# Patient Record
Sex: Male | Born: 1935 | Race: White | Hispanic: No | Marital: Married | Smoking: Never smoker
Health system: Southern US, Community
[De-identification: ages and names within clinical notes are randomized; demographics above are authoritative.]

## PROBLEM LIST (undated history)

## (undated) DIAGNOSIS — J209 Acute bronchitis, unspecified: Secondary | ICD-10-CM

## (undated) DIAGNOSIS — C439 Malignant melanoma of skin, unspecified: Secondary | ICD-10-CM

## (undated) DIAGNOSIS — I10 Essential (primary) hypertension: Secondary | ICD-10-CM

## (undated) DIAGNOSIS — E559 Vitamin D deficiency, unspecified: Secondary | ICD-10-CM

## (undated) DIAGNOSIS — K219 Gastro-esophageal reflux disease without esophagitis: Secondary | ICD-10-CM

## (undated) DIAGNOSIS — C4492 Squamous cell carcinoma of skin, unspecified: Secondary | ICD-10-CM

## (undated) DIAGNOSIS — J449 Chronic obstructive pulmonary disease, unspecified: Secondary | ICD-10-CM

## (undated) DIAGNOSIS — R600 Localized edema: Secondary | ICD-10-CM

## (undated) DIAGNOSIS — I517 Cardiomegaly: Secondary | ICD-10-CM

## (undated) DIAGNOSIS — Z87828 Personal history of other (healed) physical injury and trauma: Secondary | ICD-10-CM

## (undated) HISTORY — DX: Cardiomegaly: I51.7

## (undated) HISTORY — PX: HERNIA REPAIR: SHX51

## (undated) HISTORY — DX: Localized edema: R60.0

## (undated) HISTORY — DX: Acute bronchitis, unspecified: J20.9

## (undated) HISTORY — PX: APPENDECTOMY: SHX54

## (undated) HISTORY — PX: KNEE SURGERY: SHX244

## (undated) HISTORY — DX: Vitamin D deficiency, unspecified: E55.9

## (undated) HISTORY — DX: Squamous cell carcinoma of skin, unspecified: C44.92

## (undated) HISTORY — PX: NASAL SINUS SURGERY: SHX719

---

## 1988-07-01 DIAGNOSIS — Z87828 Personal history of other (healed) physical injury and trauma: Secondary | ICD-10-CM

## 1988-07-01 HISTORY — DX: Personal history of other (healed) physical injury and trauma: Z87.828

## 1988-07-01 HISTORY — PX: ANKLE SURGERY: SHX546

## 1993-07-01 HISTORY — PX: BRAIN SURGERY: SHX531

## 1999-11-05 ENCOUNTER — Ambulatory Visit (HOSPITAL_COMMUNITY): Admission: RE | Admit: 1999-11-05 | Discharge: 1999-11-05 | Payer: Self-pay | Admitting: Gastroenterology

## 2002-07-12 ENCOUNTER — Encounter: Payer: Self-pay | Admitting: Emergency Medicine

## 2002-07-12 ENCOUNTER — Emergency Department (HOSPITAL_COMMUNITY): Admission: EM | Admit: 2002-07-12 | Discharge: 2002-07-12 | Payer: Self-pay | Admitting: Emergency Medicine

## 2011-04-18 ENCOUNTER — Encounter: Payer: Self-pay | Admitting: *Deleted

## 2011-04-18 ENCOUNTER — Emergency Department (HOSPITAL_BASED_OUTPATIENT_CLINIC_OR_DEPARTMENT_OTHER)
Admission: EM | Admit: 2011-04-18 | Discharge: 2011-04-18 | Disposition: A | Discharge status: Home / Self Care | Payer: Medicare Other | Attending: Emergency Medicine | Admitting: Emergency Medicine

## 2011-04-18 DIAGNOSIS — J449 Chronic obstructive pulmonary disease, unspecified: Secondary | ICD-10-CM | POA: Insufficient documentation

## 2011-04-18 DIAGNOSIS — H7290 Unspecified perforation of tympanic membrane, unspecified ear: Secondary | ICD-10-CM

## 2011-04-18 DIAGNOSIS — E119 Type 2 diabetes mellitus without complications: Secondary | ICD-10-CM | POA: Insufficient documentation

## 2011-04-18 DIAGNOSIS — J4489 Other specified chronic obstructive pulmonary disease: Secondary | ICD-10-CM | POA: Insufficient documentation

## 2011-04-18 DIAGNOSIS — I10 Essential (primary) hypertension: Secondary | ICD-10-CM | POA: Insufficient documentation

## 2011-04-18 DIAGNOSIS — K219 Gastro-esophageal reflux disease without esophagitis: Secondary | ICD-10-CM | POA: Insufficient documentation

## 2011-04-18 DIAGNOSIS — H9209 Otalgia, unspecified ear: Secondary | ICD-10-CM | POA: Insufficient documentation

## 2011-04-18 HISTORY — DX: Chronic obstructive pulmonary disease, unspecified: J44.9

## 2011-04-18 HISTORY — DX: Gastro-esophageal reflux disease without esophagitis: K21.9

## 2011-04-18 HISTORY — DX: Essential (primary) hypertension: I10

## 2011-04-18 MED ORDER — AMOXICILLIN 500 MG PO CAPS
1000.0000 mg | ORAL_CAPSULE | Freq: Once | ORAL | Status: AC
  Administered 2011-04-18: 1000 mg via ORAL
  Filled 2011-04-18: qty 2

## 2011-04-18 MED ORDER — HYDROCODONE-ACETAMINOPHEN 5-325 MG PO TABS: 1.0000 | ORAL_TABLET | Freq: Four times a day (QID) | ORAL | Status: AC | PRN

## 2011-04-18 MED ORDER — AMOXICILLIN 500 MG PO CAPS: 1000.0000 mg | ORAL_CAPSULE | Freq: Three times a day (TID) | ORAL | Status: AC

## 2011-04-18 NOTE — ED Provider Notes (Signed)
History     CSN: [id number] Arrival date & time: 04/18/2011 10:55 PM   First MD Initiated Contact with Patient 04/18/11 2256      Chief Complaint  Patient presents with  . Otalgia    (Consider location/radiation/quality/duration/timing/severity/associated sxs/prior treatment) HPI This is a 77 year old white male has had several months of pain in his right ear. He has been [place] by a [place]ologist and is scheduled for tympanostomy tube in 5 days. Today the pain in that year became severe and caused him to have the worst headache of his life. He called his otolaryngologist office and was told they could not see them before the scheduled date. About 7 PM he felt a sudden release of pressure in that ear looked is accompanied by bleeding. The pain has significantly improved and is at this time only mild. He continues to have blood from the right ear and is no longer able to hear with the right ear. He also hears a popping sound in that ear from time to time along with the sound of blood rushing.  Past Medical History  Diagnosis Date  . Diabetes mellitus   . Hypertension   . COPD (chronic obstructive pulmonary disease)   . GERD (gastroesophageal reflux disease)     Past Surgical History  Procedure Date  . Hernia repair   . Back surgery   . Ankle surgery   . Brain surgery   . Appendectomy     No family history on file.  History  Substance Use Topics  . Smoking status: Never Smoker   . Smokeless tobacco: Not on file  . Alcohol Use: No      Review of Systems  All other systems reviewed and are negative.    Allergies  Review of patient's allergies indicates no known allergies.  Home Medications  No current outpatient prescriptions on file.  BP 134/75  Pulse 97  Temp(Src) 99.7 F (37.6 C) (Oral)  Resp 18  Ht 5\' 10"  (1.778 m)  Wt 245 lb (111.131 kg)  BMI 35.15 kg/m2  SpO2 97%  Physical Exam General: Well-developed, well-nourished male in no  acute distress; appearance consistent with age of record HENT: normocephalic, atraumatic; left TM is normal appearance; right TM obscured by blood, hearing decrease in right ear Eyes: pupils equal round and reactive to light; extraocular muscles intact Neck: supple Heart: regular rate and rhythm Lungs: clear to auscultation bilaterally; occasional rhonchi cleared with coughing Abdomen: soft; nondistended Extremities: No deformity; full range of motion; pulses normal; trace edema of lower leg Neurologic: Awake, alert and oriented;motor function intact in all extremities and symmetric; no facial droop Skin: Warm and dry Psychiatric: Normal mood and affect    ED Course  Procedures (including critical care time)    MDM  We'll treat with amoxicillin and advised that patient contact his ENT in the morning for possible earlier visit.         Hanley Seamen, MD 04/18/11 [phone]

## 2011-04-18 NOTE — ED Notes (Signed)
Patient has had earache x several months that [facility] ENT has been treating him for. Pt has a schedule for next Tuesday for having tubes placed in ears. States the pain worsened today, and then tonight, around 7pm, he had blood coming out of ear. Pt is concerned for ruptured eardrum. States he cannot hear out of that ear now.

## 2011-10-30 ENCOUNTER — Encounter (INDEPENDENT_AMBULATORY_CARE_PROVIDER_SITE_OTHER): Payer: Medicare Other | Admitting: Ophthalmology

## 2011-11-07 ENCOUNTER — Encounter (INDEPENDENT_AMBULATORY_CARE_PROVIDER_SITE_OTHER): Payer: Medicare Other | Admitting: Ophthalmology

## 2011-11-07 DIAGNOSIS — E1165 Type 2 diabetes mellitus with hyperglycemia: Secondary | ICD-10-CM

## 2011-11-07 DIAGNOSIS — H35039 Hypertensive retinopathy, unspecified eye: Secondary | ICD-10-CM

## 2011-11-07 DIAGNOSIS — I10 Essential (primary) hypertension: Secondary | ICD-10-CM

## 2011-11-07 DIAGNOSIS — H2510 Age-related nuclear cataract, unspecified eye: Secondary | ICD-10-CM

## 2011-11-07 DIAGNOSIS — H3530 Unspecified macular degeneration: Secondary | ICD-10-CM

## 2011-11-07 DIAGNOSIS — E11319 Type 2 diabetes mellitus with unspecified diabetic retinopathy without macular edema: Secondary | ICD-10-CM

## 2011-11-07 DIAGNOSIS — H43819 Vitreous degeneration, unspecified eye: Secondary | ICD-10-CM

## 2011-12-06 DIAGNOSIS — I10 Essential (primary) hypertension: Secondary | ICD-10-CM

## 2011-12-06 HISTORY — DX: Essential (primary) hypertension: I10

## 2012-06-15 ENCOUNTER — Other Ambulatory Visit (HOSPITAL_COMMUNITY): Payer: Self-pay | Admitting: Cardiovascular Disease

## 2012-06-15 DIAGNOSIS — R609 Edema, unspecified: Secondary | ICD-10-CM

## 2012-06-15 DIAGNOSIS — R0989 Other specified symptoms and signs involving the circulatory and respiratory systems: Secondary | ICD-10-CM

## 2012-07-03 ENCOUNTER — Ambulatory Visit (HOSPITAL_COMMUNITY)
Admission: RE | Admit: 2012-07-03 | Discharge: 2012-07-03 | Disposition: A | Discharge status: Home / Self Care | Payer: Medicare Other | Source: Ambulatory Visit | Attending: Cardiovascular Disease | Admitting: Cardiovascular Disease

## 2012-07-03 DIAGNOSIS — R609 Edema, unspecified: Secondary | ICD-10-CM

## 2012-07-03 DIAGNOSIS — M7989 Other specified soft tissue disorders: Secondary | ICD-10-CM | POA: Insufficient documentation

## 2012-07-03 DIAGNOSIS — R0989 Other specified symptoms and signs involving the circulatory and respiratory systems: Secondary | ICD-10-CM | POA: Insufficient documentation

## 2012-07-03 DIAGNOSIS — R600 Localized edema: Secondary | ICD-10-CM

## 2012-07-03 HISTORY — DX: Localized edema: R60.0

## 2012-07-03 NOTE — Progress Notes (Signed)
LLE venous duplex completed. Tim Morgan

## 2012-07-03 NOTE — Progress Notes (Signed)
Carotid Duplex Completed. Tim Morgan D  

## 2012-10-23 ENCOUNTER — Encounter: Payer: Self-pay | Admitting: *Deleted

## 2012-10-30 ENCOUNTER — Encounter: Payer: Self-pay | Admitting: Cardiovascular Disease

## 2012-11-06 ENCOUNTER — Ambulatory Visit (INDEPENDENT_AMBULATORY_CARE_PROVIDER_SITE_OTHER): Payer: Medicare Other | Admitting: Ophthalmology

## 2012-11-06 ENCOUNTER — Encounter (HOSPITAL_COMMUNITY): Payer: Self-pay | Admitting: Pharmacy Technician

## 2012-11-09 NOTE — Progress Notes (Signed)
Need Pre Op orders please- has appt 11/16/12 [facility]  Thanks

## 2012-11-10 ENCOUNTER — Other Ambulatory Visit: Payer: Self-pay | Admitting: Orthopedic Surgery

## 2012-11-10 MED ORDER — DEXAMETHASONE SODIUM PHOSPHATE 10 MG/ML IJ SOLN: 10.0000 mg | Freq: Once | INTRAMUSCULAR | Status: DC

## 2012-11-10 NOTE — Progress Notes (Signed)
Preoperative surgical orders have been place into the Epic hospital system for Tim Morgan on 11/10/2012, 9:10 AM  by Patrica Duel for surgery on 11/18/2012.  Preop Knee Scope orders including IV Tylenol and IV Decadron as long as there are no contraindications to the above medications. Avel Peace, PA-C

## 2012-11-13 ENCOUNTER — Other Ambulatory Visit (HOSPITAL_COMMUNITY): Payer: Self-pay | Admitting: *Deleted

## 2012-11-13 NOTE — Patient Instructions (Addendum)
20 Tim Morgan  11/13/2012   Your procedure is scheduled on: 11-18-2012  Report to [facility] at 745 AM.  Call this number if you have problems the morning of surgery [phone]   Remember:   Do not eat food or drink liquids :After Midnight.     Take these medicines the morning of surgery with A SIP OF WATER: nexium, quar inhaler                                SEE [place] PREPARING FOR SURGERY SHEET   Do not wear jewelry, make-up or nail polish.  Do not wear lotions, powders, or perfumes. You may wear deodorant.   Men may shave face and neck.  Do not bring valuables to the hospital.  Contacts, dentures or bridgework may not be worn into surgery.  Leave suitcase in the car. After surgery it may be brought to your room.  For patients admitted to the hospital, checkout time is 11:00 AM the day of discharge.   Patients discharged the day of surgery will not be allowed to drive home.  Name and phone number of your driver:jacqueline wife cell [phone]  Special Instructions: N/A   Please read over the following fact sheets that you were given: MRSA Information.  Call Cain Sieve RN pre op nurse if needed [phone]    FAILURE TO FOLLOW THESE INSTRUCTIONS MAY RESULT IN THE CANCELLATION OF YOUR SURGERY. PATIENT SIGNATURE___________________________________________

## 2012-11-16 ENCOUNTER — Encounter (HOSPITAL_COMMUNITY): Payer: Self-pay

## 2012-11-16 ENCOUNTER — Encounter (HOSPITAL_COMMUNITY)
Admission: RE | Admit: 2012-11-16 | Discharge: 2012-11-16 | Disposition: A | Discharge status: Home / Self Care | Payer: Medicare Other | Source: Ambulatory Visit | Attending: Orthopedic Surgery | Admitting: Orthopedic Surgery

## 2012-11-16 HISTORY — DX: Personal history of other (healed) physical injury and trauma: Z87.828

## 2012-11-16 LAB — BASIC METABOLIC PANEL
CO2: 30 mEq/L (ref 19–32)
Calcium: 9.8 mg/dL (ref 8.4–10.5)
Creatinine, Ser: 1.19 mg/dL (ref 0.50–1.35)
GFR calc non Af Amer: 57 mL/min — ABNORMAL LOW (ref >90)
Glucose, Bld: 161 mg/dL — ABNORMAL HIGH (ref 70–99)
Sodium: 138 mEq/L (ref 135–145)

## 2012-11-16 LAB — SURGICAL PCR SCREEN: MRSA, PCR: NEGATIVE

## 2012-11-16 LAB — CBC
MCH: 30.4 pg (ref 26.0–34.0)
MCV: 90.2 fL (ref 78.0–100.0)
Platelets: 244 10*3/uL (ref 150–400)
RBC: 4.37 MIL/uL (ref 4.22–5.81)
RDW: 13 % (ref 11.5–15.5)

## 2012-11-16 NOTE — Progress Notes (Signed)
bmet results faxed to dr aluisio by epic 

## 2012-11-16 NOTE — Progress Notes (Signed)
11/16/12 0917  OBSTRUCTIVE SLEEP APNEA  Have you ever been diagnosed with sleep apnea through a sleep study? No  Do you snore loudly (loud enough to be heard through closed doors)?  0  Do you often feel tired, fatigued, or sleepy during the daytime? 0  Has anyone observed you stop breathing during your sleep? 0  Do you have, or are you being treated for high blood pressure? 1  BMI more than 35 kg/m2? 1  Age over 77 years old? 1  Neck circumference greater than 40 cm/18 inches? 0  Gender: 1  Obstructive Sleep Apnea Score 4  Score 4 or greater  Results sent to PCP

## 2012-11-17 NOTE — Progress Notes (Signed)
eccho 5/13, EKG 2/14, chest x ray (old) from 4/13 placed on chart

## 2012-11-17 NOTE — H&P (Signed)
CC- Tim Morgan is a 79 y.o. male who presents with left knee pain.  HPI- . Knee Pain: Patient presents with knee pain involving the  left knee. Onset of the symptoms was several months ago. Inciting event: none known. Current symptoms include giving out, pain located medially, popping sensation and swelling. Pain is aggravated by going up and down stairs, lateral movements, pivoting, rising after sitting and squatting.  Patient has had no prior knee problems. Evaluation to date: MRI: abnormal medial meniscal tear. Treatment to date: rest.  Past Medical History  Diagnosis Date  . Diabetes mellitus   . COPD (chronic obstructive pulmonary disease)   . GERD (gastroesophageal reflux disease)   . Vitamin D deficiency   . Left ventricular hypertrophy   . Acute bronchitis   . Hypertension 12-06-2011    echo-EF 65-70%   . 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 History of back injury 1990    L 4 to , no surgery healed on own    Past Surgical History  Procedure Laterality Date  . Ankle surgery Left 1990  . Appendectomy  age 36  . Hernia repair      x 2  . Brain surgery  1995    concusion  . Nasal sinus surgery Bilateral     to open up ear tubes    Prior to Admission medications   Medication Sig Start Date End Date Taking? Authorizing Provider  amLODipine-olmesartan (AZOR) 5-40 MG per tablet Take 1 tablet by mouth every morning.     Historical Provider, MD  beclomethasone (QVAR) 40 MCG/ACT inhaler Inhale 2 puffs into the lungs 2 (two) times daily.    Historical Provider, MD  calcium citrate-vitamin D (CITRACAL+D) 315-200 MG-UNIT per tablet Take 1 tablet by mouth 2 (two) times daily.    Historical Provider, MD  Chromium-Cinnamon  (CINNAMON PLUS CHROMIUM PO) Take 2-3 capsules by mouth 2 (two) times daily.    Historical Provider, MD  esomeprazole (NEXIUM) 40 MG capsule Take 40 mg by mouth daily.      Historical Provider, MD  gabapentin (NEURONTIN) 300 MG capsule Take 300 mg by mouth 2 (two) times daily.     Historical Provider, MD  glyBURIDE-metformin (GLUCOVANCE) 5-500 MG per tablet Take 1 tablet by mouth 2 (two) times daily with a meal.     Historical Provider, MD  hydrochlorothiazide (MICROZIDE) 12.5 MG capsule Take 12.5 mg by mouth every morning.     Historical Provider, MD  MAGNESIUM-ZINC PO Take 1 tablet by mouth 2 (two) times daily.    Historical Provider, MD  methocarbamol (ROBAXIN) 500 MG tablet Take 500 mg by mouth 2 (two) times daily.     Historical Provider, MD  Multiple Vitamin (MULTIVITAMIN WITH MINERALS) TABS Take 1 tablet by mouth daily.    Historical Provider, MD  Omega-3 Fatty Acids (FISH OIL) 1200 MG CAPS Take 1,200 mg by mouth 2 (two) times daily.    Historical Provider, MD  Potassium 99 MG TABS Take 99 mg by mouth daily.    Historical Provider, MD  pravastatin (PRAVACHOL) 40 MG tablet Take 40 mg by mouth at bedtime.    Historical Provider, MD  Saw Palmetto, Serenoa repens, 320 MG CAPS Take 320 mg by mouth at bedtime.    Historical Provider,  MD  sitaGLIPtin (JANUVIA) 100 MG tablet Take 100 mg by mouth at bedtime.     Historical Provider, MD  vitamin E 400 UNIT capsule Take 800 Units by mouth daily.    Historical Provider, MD   KNEE EXAM antalgic gait, soft tissue tenderness over medial joint line, no effusion, negative drawer sign, collateral ligaments intact  Physical Examination: General appearance - alert, well appearing, and in no distress Mental status - alert, oriented to person, place, and time Chest - clear to auscultation, no wheezes, rales or rhonchi, symmetric air entry Heart - normal rate, regular rhythm, normal S1, S2, no murmurs, rubs, clicks or gallops Abdomen - soft, nontender,  nondistended, no masses or organomegaly Neurological - alert, oriented, normal speech, no focal findings or movement disorder noted   Asessment/Plan--- Left knee medial meniscal tear- - Plan left knee arthroscopy with meniscal debridement. Procedure risks and potential comps discussed with patient who elects to proceed. Goals are decreased pain and increased function with a high likelihood of achieving both

## 2012-11-17 NOTE — Progress Notes (Signed)
OV 08/19/12 [facility] Gerre Pebbles with stress test 3/14 and u/s abdomen 3/14 on chart

## 2012-11-18 ENCOUNTER — Ambulatory Visit (HOSPITAL_COMMUNITY): Payer: Medicare Other | Admitting: Anesthesiology

## 2012-11-18 ENCOUNTER — Ambulatory Visit (HOSPITAL_COMMUNITY): Payer: Medicare Other

## 2012-11-18 ENCOUNTER — Encounter (HOSPITAL_COMMUNITY): Payer: Self-pay | Admitting: *Deleted

## 2012-11-18 ENCOUNTER — Ambulatory Visit (HOSPITAL_COMMUNITY)
Admission: RE | Admit: 2012-11-18 | Discharge: 2012-11-18 | Disposition: A | Discharge status: Home / Self Care | Payer: Medicare Other | Source: Ambulatory Visit | Attending: Orthopedic Surgery | Admitting: Orthopedic Surgery

## 2012-11-18 ENCOUNTER — Encounter (HOSPITAL_COMMUNITY): Payer: Self-pay | Admitting: Anesthesiology

## 2012-11-18 ENCOUNTER — Encounter (HOSPITAL_COMMUNITY)
Admission: RE | Disposition: A | Discharge status: Home / Self Care | Payer: Self-pay | Source: Ambulatory Visit | Attending: Orthopedic Surgery

## 2012-11-18 DIAGNOSIS — S83242A Other tear of medial meniscus, current injury, left knee, initial encounter: Secondary | ICD-10-CM

## 2012-11-18 DIAGNOSIS — K219 Gastro-esophageal reflux disease without esophagitis: Secondary | ICD-10-CM | POA: Insufficient documentation

## 2012-11-18 DIAGNOSIS — S83249A Other tear of medial meniscus, current injury, unspecified knee, initial encounter: Secondary | ICD-10-CM

## 2012-11-18 DIAGNOSIS — Z79899 Other long term (current) drug therapy: Secondary | ICD-10-CM | POA: Insufficient documentation

## 2012-11-18 DIAGNOSIS — E669 Obesity, unspecified: Secondary | ICD-10-CM | POA: Insufficient documentation

## 2012-11-18 DIAGNOSIS — M23329 Other meniscus derangements, posterior horn of medial meniscus, unspecified knee: Secondary | ICD-10-CM | POA: Insufficient documentation

## 2012-11-18 DIAGNOSIS — E119 Type 2 diabetes mellitus without complications: Secondary | ICD-10-CM | POA: Insufficient documentation

## 2012-11-18 DIAGNOSIS — J449 Chronic obstructive pulmonary disease, unspecified: Secondary | ICD-10-CM | POA: Insufficient documentation

## 2012-11-18 DIAGNOSIS — I10 Essential (primary) hypertension: Secondary | ICD-10-CM | POA: Insufficient documentation

## 2012-11-18 DIAGNOSIS — J4489 Other specified chronic obstructive pulmonary disease: Secondary | ICD-10-CM | POA: Insufficient documentation

## 2012-11-18 DIAGNOSIS — M899 Disorder of bone, unspecified: Secondary | ICD-10-CM | POA: Insufficient documentation

## 2012-11-18 HISTORY — PX: KNEE ARTHROSCOPY: SHX127

## 2012-11-18 LAB — GLUCOSE, CAPILLARY: Glucose-Capillary: 150 mg/dL — ABNORMAL HIGH (ref 70–99)

## 2012-11-18 SURGERY — ARTHROSCOPY, KNEE
Anesthesia: General | Site: Knee | Laterality: Left | Wound class: Clean

## 2012-11-18 MED ORDER — OXYCODONE HCL 5 MG/5ML PO SOLN
5.0000 mg | Freq: Once | ORAL | Status: DC | PRN
Start: 2012-11-18 — End: 2012-11-18

## 2012-11-18 MED ORDER — METHOCARBAMOL 500 MG PO TABS: 500.0000 mg | ORAL_TABLET | Freq: Four times a day (QID) | ORAL | Status: DC

## 2012-11-18 MED ORDER — CEFAZOLIN SODIUM-DEXTROSE 2-3 GM-% IV SOLR
INTRAVENOUS | Status: AC
  Filled 2012-11-18: qty 50

## 2012-11-18 MED ORDER — LACTATED RINGERS IR SOLN
Status: DC | PRN
  Administered 2012-11-18: 6000 mL

## 2012-11-18 MED ORDER — LIDOCAINE HCL (CARDIAC) 20 MG/ML IV SOLN
INTRAVENOUS | Status: DC | PRN
  Administered 2012-11-18: 50 mg via INTRAVENOUS

## 2012-11-18 MED ORDER — ACETAMINOPHEN 10 MG/ML IV SOLN: 1000.0000 mg | Freq: Once | INTRAVENOUS | Status: DC

## 2012-11-18 MED ORDER — ACETAMINOPHEN 10 MG/ML IV SOLN: 1000.0000 mg | Freq: Once | INTRAVENOUS | Status: DC | PRN

## 2012-11-18 MED ORDER — ONDANSETRON HCL 4 MG/2ML IJ SOLN
INTRAMUSCULAR | Status: DC | PRN
  Administered 2012-11-18: 4 mg via INTRAVENOUS

## 2012-11-18 MED ORDER — PROPOFOL 10 MG/ML IV BOLUS
INTRAVENOUS | Status: DC | PRN
  Administered 2012-11-18: 150 mg via INTRAVENOUS

## 2012-11-18 MED ORDER — ACETAMINOPHEN 10 MG/ML IV SOLN
INTRAVENOUS | Status: AC
  Filled 2012-11-18: qty 100

## 2012-11-18 MED ORDER — BUPIVACAINE-EPINEPHRINE 0.25% -1:200000 IJ SOLN
INTRAMUSCULAR | Status: DC | PRN
  Administered 2012-11-18: 20 mL

## 2012-11-18 MED ORDER — LACTATED RINGERS IV SOLN: INTRAVENOUS | Status: DC

## 2012-11-18 MED ORDER — CEFAZOLIN SODIUM 1-5 GM-% IV SOLN
INTRAVENOUS | Status: AC
  Filled 2012-11-18: qty 50

## 2012-11-18 MED ORDER — HYDROMORPHONE HCL PF 1 MG/ML IJ SOLN
0.2500 mg | INTRAMUSCULAR | Status: DC | PRN
  Administered 2012-11-18: 0.25 mg via INTRAVENOUS

## 2012-11-18 MED ORDER — FENTANYL CITRATE 0.05 MG/ML IJ SOLN
INTRAMUSCULAR | Status: DC | PRN
  Administered 2012-11-18 (×3): 25 ug via INTRAVENOUS

## 2012-11-18 MED ORDER — LACTATED RINGERS IV SOLN
INTRAVENOUS | Status: DC | PRN
  Administered 2012-11-18: 09:00:00 via INTRAVENOUS

## 2012-11-18 MED ORDER — MEPERIDINE HCL 50 MG/ML IJ SOLN: 6.2500 mg | INTRAMUSCULAR | Status: DC | PRN

## 2012-11-18 MED ORDER — ACETAMINOPHEN 10 MG/ML IV SOLN
INTRAVENOUS | Status: DC | PRN
  Administered 2012-11-18: 1000 mg via INTRAVENOUS

## 2012-11-18 MED ORDER — PROMETHAZINE HCL 25 MG/ML IJ SOLN: 6.2500 mg | INTRAMUSCULAR | Status: DC | PRN

## 2012-11-18 MED ORDER — SODIUM CHLORIDE 0.9 % IV SOLN: INTRAVENOUS | Status: DC

## 2012-11-18 MED ORDER — HYDROMORPHONE HCL PF 1 MG/ML IJ SOLN
INTRAMUSCULAR | Status: AC
  Filled 2012-11-18: qty 1

## 2012-11-18 MED ORDER — OXYCODONE HCL 5 MG PO TABS: 5.0000 mg | ORAL_TABLET | Freq: Once | ORAL | Status: DC | PRN

## 2012-11-18 MED ORDER — BUPIVACAINE-EPINEPHRINE PF 0.25-1:200000 % IJ SOLN
INTRAMUSCULAR | Status: AC
  Filled 2012-11-18: qty 30

## 2012-11-18 MED ORDER — DEXTROSE 5 % IV SOLN
3.0000 g | INTRAVENOUS | Status: AC
  Administered 2012-11-18: 3 g via INTRAVENOUS
  Filled 2012-11-18: qty 3000

## 2012-11-18 MED ORDER — HYDROCODONE-ACETAMINOPHEN 5-325 MG PO TABS: 1.0000 | ORAL_TABLET | Freq: Four times a day (QID) | ORAL | Status: DC | PRN

## 2012-11-18 SURGICAL SUPPLY — 23 items
BANDAGE ELASTIC 6 VELCRO ST LF (GAUZE/BANDAGES/DRESSINGS) ×2 IMPLANT
BLADE 4.2CUDA (BLADE) ×2 IMPLANT
CLOTH BEACON ORANGE TIMEOUT ST (SAFETY) ×2 IMPLANT
CUFF TOURN SGL QUICK 34 (TOURNIQUET CUFF) ×1
CUFF TRNQT CYL 34X4X40X1 (TOURNIQUET CUFF) ×1 IMPLANT
DRAPE U-SHAPE 47X51 STRL (DRAPES) ×2 IMPLANT
DRSG EMULSION OIL 3X3 NADH (GAUZE/BANDAGES/DRESSINGS) ×2 IMPLANT
DURAPREP 26ML APPLICATOR (WOUND CARE) ×2 IMPLANT
GLOVE BIO SURGEON STRL SZ8 (GLOVE) ×2 IMPLANT
GLOVE BIOGEL PI IND STRL 8 (GLOVE) ×1 IMPLANT
GLOVE BIOGEL PI INDICATOR 8 (GLOVE) ×1
GOWN STRL NON-REIN LRG LVL3 (GOWN DISPOSABLE) ×2 IMPLANT
MANIFOLD NEPTUNE II (INSTRUMENTS) ×4 IMPLANT
PACK ARTHROSCOPY WL (CUSTOM PROCEDURE TRAY) ×2 IMPLANT
PACK ICE MAXI GEL EZY WRAP (MISCELLANEOUS) ×6 IMPLANT
PADDING CAST COTTON 6X4 STRL (CAST SUPPLIES) ×2 IMPLANT
POSITIONER SURGICAL ARM (MISCELLANEOUS) ×2 IMPLANT
SET ARTHROSCOPY TUBING (MISCELLANEOUS) ×1
SET ARTHROSCOPY TUBING LN (MISCELLANEOUS) ×1 IMPLANT
SUT ETHILON 4 0 PS 2 18 (SUTURE) ×2 IMPLANT
TOWEL OR 17X26 10 PK STRL BLUE (TOWEL DISPOSABLE) ×2 IMPLANT
WAND 90 DEG TURBOVAC W/CORD (SURGICAL WAND) ×2 IMPLANT
WRAP KNEE MAXI GEL POST OP (GAUZE/BANDAGES/DRESSINGS) ×4 IMPLANT

## 2012-11-18 NOTE — Transfer of Care (Signed)
Immediate Anesthesia Transfer of Care Note  Patient: Tim Morgan  Procedure(s) Performed: Procedure(s) (LRB): LEFT ARTHROSCOPY KNEE WITH DEBRIDEMENT AND CHRONDROLPLASTY (Left)  Patient Location: PACU  Anesthesia Type: General  Level of Consciousness: sedated, patient cooperative and responds to stimulaton  Airway & Oxygen Therapy: Patient Spontanous Breathing and Patient connected to face mask oxgen  Post-op Assessment: Report given to PACU RN and Post -op Vital signs reviewed and stable  Post vital signs: Reviewed and stable  Complications: No apparent anesthesia complications

## 2012-11-18 NOTE — Interval H&P Note (Signed)
History and Physical Interval Note:  11/18/2012 9:50 AM  Tim Morgan  has presented today for surgery, with the diagnosis of LEFT KNEE MEDIAL MENISCUS TEAR  The various methods of treatment have been discussed with the patient and family. After consideration of risks, benefits and other options for treatment, the patient has consented to  Procedure(s): LEFT ARTHROSCOPY KNEE WITH DEBRIDEMENT (Left) as a surgical intervention .  The patient's history has been reviewed, patient examined, no change in status, stable for surgery.  I have reviewed the patient's chart and labs.  Questions were answered to the patient's satisfaction.     Loanne Drilling

## 2012-11-18 NOTE — Anesthesia Postprocedure Evaluation (Signed)
Anesthesia Post Note  Patient: Tim Morgan  Procedure(s) Performed: Procedure(s) (LRB): LEFT ARTHROSCOPY KNEE WITH DEBRIDEMENT AND CHRONDROLPLASTY (Left)  Anesthesia type: General  Patient location: PACU  Post pain: Pain level controlled  Post assessment: Post-op Vital signs reviewed  Last Vitals: BP 115/55  Pulse 69  Temp(Src) 36.4 C (Oral)  Resp 16  SpO2 96%  Post vital signs: Reviewed  Level of consciousness: sedated  Complications: No apparent anesthesia complications

## 2012-11-18 NOTE — Anesthesia Preprocedure Evaluation (Addendum)
Anesthesia Evaluation  Patient identified by MRN, date of birth, ID band Patient awake    Reviewed: Allergy & Precautions, H&P , NPO status , Patient's Chart, lab work & pertinent test results  Airway Mallampati: II TM Distance: >3 FB Neck ROM: Full    Dental  (+) Dental Advisory Given and Teeth Intact   Pulmonary COPD COPD inhaler,  breath sounds clear to auscultation        Cardiovascular hypertension, Pt. on medications Rhythm:Regular Rate:Normal     Neuro/Psych negative neurological ROS  negative psych ROS   GI/Hepatic Neg liver ROS, GERD-  Medicated,  Endo/Other  diabetes  Renal/GU negative Renal ROS     Musculoskeletal negative musculoskeletal ROS (+)   Abdominal (+) + obese,   Peds  Hematology negative hematology ROS (+)   Anesthesia Other Findings   Reproductive/Obstetrics                          Anesthesia Physical Anesthesia Plan  ASA: III  Anesthesia Plan: General   Post-op Pain Management:    Induction: Intravenous  Airway Management Planned: LMA  Additional Equipment:   Intra-op Plan:   Post-operative Plan: Extubation in OR  Informed Consent: I have reviewed the patients History and Physical, chart, labs and discussed the procedure including the risks, benefits and alternatives for the proposed anesthesia with the patient or authorized representative who has indicated his/her understanding and acceptance.   Dental advisory given  Plan Discussed with: CRNA  Anesthesia Plan Comments:         Anesthesia Quick Evaluation

## 2012-11-18 NOTE — Progress Notes (Signed)
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

## 2012-11-18 NOTE — Op Note (Signed)
Preoperative diagnosis-  Left knee medial meniscal tear  Postoperative diagnosis Left- knee medial meniscal tear   Plus Left medial femoral chondral defect  Procedure- Left knee arthroscopy with medial  Meniscal debridement and chondroplasty  Surgeon- Gus Rankin. Kingston Guiles, MD  Anesthesia-General  EBL-  minimal Complications- None  Condition- PACU - hemodynamically stable.  Brief clinical note- -Tim Morgan is a 79 y.o.  male with a several month history of left knee pain and mechanical symptoms. Exam and history suggested medial meniscal tear confirmed by MRI. The patient presents now for arthroscopy and debridement   Procedure in detail -       After successful administration of General anesthetic, a tourmiquet is placed high on the Left  thigh and the Left lower extremity is prepped and draped in the usual sterile fashion. Time out is performed by the surgical team. Standard superomedial and inferolateral portal sites are marked and incisions made with an 11 blade. The inflow cannula is passed through the superomedial portal and camera through the inferolateral portal and inflow is initiated. Arthroscopic visualization proceeds.      The undersurface of the patella and trochlea are visualized and they appear normal with minimal chondromalacia. The medial and lateral gutters are visualized and there are  no loose bodies. Flexion and valgus force is applied to the knee and the medial compartment is entered. A spinal needle is passed into the joint through the site marked for the inferomedial portal. A small incision is made and the dilator passed into the joint. The findings for the medial compartment are medial meniscal tear body and posterior horn as well as chondral defect medial femoral condyle . The tear is debrided to a stable base with baskets and a shaver and sealed off with the Arthrocare. The shaver is used to debride the unstable cartilage to a stable cartilaginous base with stable  edges. It is probed and found to be stable.    The intercondylar notch is visualized and the ACL appears normal. The lateral compartment is entered and the findings are normal .      The joint is again inspected and there are no other tears, defects or loose bodies identified. The arthroscopic equipment is then removed from the inferior portals which are closed with interrupted 4-0 nylon. 20 ml of .25% Marcaine with epinephrine are injected through the inflow cannula and the cannula is then removed and the portal closed with nylon. The incisions are cleaned and dried and a bulky sterile dressing is applied. The patient is then awakened and transported to recovery in stable condition.   11/18/2012, 10:40 AM

## 2012-11-19 ENCOUNTER — Encounter (HOSPITAL_COMMUNITY): Payer: Self-pay | Admitting: Orthopedic Surgery

## 2013-02-17 ENCOUNTER — Ambulatory Visit (INDEPENDENT_AMBULATORY_CARE_PROVIDER_SITE_OTHER): Payer: Medicare Other | Admitting: Ophthalmology

## 2013-02-17 DIAGNOSIS — H3530 Unspecified macular degeneration: Secondary | ICD-10-CM

## 2013-02-17 DIAGNOSIS — E11319 Type 2 diabetes mellitus with unspecified diabetic retinopathy without macular edema: Secondary | ICD-10-CM

## 2013-02-17 DIAGNOSIS — I10 Essential (primary) hypertension: Secondary | ICD-10-CM

## 2013-02-17 DIAGNOSIS — E1139 Type 2 diabetes mellitus with other diabetic ophthalmic complication: Secondary | ICD-10-CM

## 2013-02-17 DIAGNOSIS — H35039 Hypertensive retinopathy, unspecified eye: Secondary | ICD-10-CM

## 2013-02-17 DIAGNOSIS — H43819 Vitreous degeneration, unspecified eye: Secondary | ICD-10-CM

## 2013-05-19 ENCOUNTER — Encounter (HOSPITAL_BASED_OUTPATIENT_CLINIC_OR_DEPARTMENT_OTHER): Payer: Self-pay | Admitting: Emergency Medicine

## 2013-05-19 ENCOUNTER — Emergency Department (HOSPITAL_BASED_OUTPATIENT_CLINIC_OR_DEPARTMENT_OTHER)
Admission: EM | Admit: 2013-05-19 | Discharge: 2013-05-19 | Disposition: A | Discharge status: Home / Self Care | Payer: Medicare Other | Attending: Emergency Medicine | Admitting: Emergency Medicine

## 2013-05-19 DIAGNOSIS — Z87828 Personal history of other (healed) physical injury and trauma: Secondary | ICD-10-CM | POA: Insufficient documentation

## 2013-05-19 DIAGNOSIS — Z79899 Other long term (current) drug therapy: Secondary | ICD-10-CM | POA: Insufficient documentation

## 2013-05-19 DIAGNOSIS — I10 Essential (primary) hypertension: Secondary | ICD-10-CM | POA: Insufficient documentation

## 2013-05-19 DIAGNOSIS — R112 Nausea with vomiting, unspecified: Secondary | ICD-10-CM | POA: Insufficient documentation

## 2013-05-19 DIAGNOSIS — J111 Influenza due to unidentified influenza virus with other respiratory manifestations: Secondary | ICD-10-CM

## 2013-05-19 DIAGNOSIS — IMO0002 Reserved for concepts with insufficient information to code with codable children: Secondary | ICD-10-CM | POA: Insufficient documentation

## 2013-05-19 DIAGNOSIS — E119 Type 2 diabetes mellitus without complications: Secondary | ICD-10-CM | POA: Insufficient documentation

## 2013-05-19 DIAGNOSIS — J441 Chronic obstructive pulmonary disease with (acute) exacerbation: Secondary | ICD-10-CM | POA: Insufficient documentation

## 2013-05-19 DIAGNOSIS — K219 Gastro-esophageal reflux disease without esophagitis: Secondary | ICD-10-CM | POA: Insufficient documentation

## 2013-05-19 DIAGNOSIS — J069 Acute upper respiratory infection, unspecified: Secondary | ICD-10-CM

## 2013-05-19 LAB — COMPREHENSIVE METABOLIC PANEL
ALT: 15 U/L (ref 0–53)
AST: 14 U/L (ref 0–37)
Calcium: 9 mg/dL (ref 8.4–10.5)
GFR calc Af Amer: 59 mL/min — ABNORMAL LOW (ref >90)
Sodium: 135 mEq/L (ref 135–145)
Total Protein: 7.2 g/dL (ref 6.0–8.3)

## 2013-05-19 LAB — CBC WITH DIFFERENTIAL/PLATELET
Basophils Absolute: 0 10*3/uL (ref 0.0–0.1)
Eosinophils Absolute: 0 10*3/uL (ref 0.0–0.7)
Eosinophils Relative: 0 % (ref 0–5)
MCH: 30.1 pg (ref 26.0–34.0)
MCV: 92.1 fL (ref 78.0–100.0)
Platelets: 207 10*3/uL (ref 150–400)
RDW: 12.8 % (ref 11.5–15.5)
WBC: 9.8 10*3/uL (ref 4.0–10.5)

## 2013-05-19 MED ORDER — LEVOFLOXACIN 750 MG PO TABS: 750.0000 mg | ORAL_TABLET | Freq: Every day | ORAL | Status: DC

## 2013-05-19 MED ORDER — ACETAMINOPHEN 325 MG PO TABS
650.0000 mg | ORAL_TABLET | Freq: Once | ORAL | Status: AC
  Administered 2013-05-19: 650 mg via ORAL
  Filled 2013-05-19: qty 2

## 2013-05-19 MED ORDER — LEVOFLOXACIN 750 MG PO TABS
750.0000 mg | ORAL_TABLET | Freq: Once | ORAL | Status: AC
  Administered 2013-05-19: 750 mg via ORAL
  Filled 2013-05-19: qty 1

## 2013-05-19 NOTE — ED Notes (Signed)
Pt sent here from [facility] office for eval URI symptoms with fever x 1 day ? Pneumonia  Chest xray disc in hand

## 2013-05-19 NOTE — ED Provider Notes (Signed)
CSN: [id number]     Arrival date & time 05/19/13  1858 History  This chart was scribed for Gwyneth Sprout, MD by Dorothey Baseman, ED Scribe. This patient was seen in room MH09/MH09 and the patient's care was started at 7:32 PM.    Chief Complaint  Patient presents with  . URI   The history is provided by the patient and the spouse. No language interpreter was used.   HPI Comments: Tim Morgan is a 79 y.o. Male with a history of COPD who presents to the Emergency Department complaining of URI-like symptoms including fever (106 measured at [facility], 103.1 measured in the ED), chills, tremors, nausea, and emesis onset today. Patient was seen by his PCP earlier today for similar complaints and was sent here due to concern for pneumonia after receiving a chest x-ray. His wife reports that the patient has been out in the cold and rain recently. He reports taking Alkaseltzer at home without relief. His wife reports that the patient had one episode of hyperglycemia earlier today after he received the Alkaseltzer. Patient reports that he has been eating and drinking normally. He reports cough secondary to his COPD, but states that his sputum has recently changed from being yellow to being more green/black in color. He denies any recent exacerbation of his resting shortness of breath. Patient reports that he uses Qvar at home twice daily, once at night and once in the morning, for his COPD. Patient also reports a history of DM and HTN.   Past Medical History  Diagnosis Date  . Diabetes mellitus   . COPD (chronic obstructive pulmonary disease)   . GERD (gastroesophageal reflux disease)   . Vitamin D deficiency   . Left ventricular hypertrophy   . Acute bronchitis   . Hypertension 12-06-2011    echo-EF 65-70%   . 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 History of back injury 1990    L 4 to , no surgery healed on own   Past Surgical History  Procedure Laterality Date  . Ankle surgery Left 1990  . Appendectomy  age 79  . Hernia repair      x 2  . Brain surgery  1995    concusion  . Nasal sinus surgery Bilateral     to open up ear tubes  . Knee arthroscopy Left 11/18/2012    Procedure: LEFT ARTHROSCOPY KNEE WITH DEBRIDEMENT AND CHRONDROLPLASTY;  Surgeon: Loanne Drilling, MD;  Location: [facility];  Service: Orthopedics;  Laterality: Left;   History reviewed. No pertinent family history. History  Substance Use Topics  . Smoking status: Never Smoker   . Smokeless tobacco: Never Used  . Alcohol Use: No    Review of Systems  A complete 10 system review of systems was obtained and all systems are negative except as noted in the HPI and PMH.   Allergies  Review of patient's allergies indicates no known allergies.  Home Medications   Current Outpatient Rx  Name  Route  Sig  Dispense  Refill  . amLODipine-olmesartan (AZOR) 5-40 MG per tablet   Oral   Take 1 tablet by mouth every morning.          . beclomethasone (QVAR) 40 MCG/ACT inhaler   Inhalation   Inhale 2  puffs into the lungs 2 (two) times daily.         . calcium citrate-vitamin D (CITRACAL+D) 315-200 MG-UNIT per tablet   Oral   Take 1 tablet by mouth 2 (two) times daily.         . Chromium-Cinnamon (CINNAMON PLUS CHROMIUM PO)   Oral   Take 2-3 capsules by mouth 2 (two) times daily.         Marland Kitchen esomeprazole (NEXIUM) 40 MG capsule   Oral   Take 40 mg by mouth daily.           Marland Kitchen gabapentin (NEURONTIN) 300 MG capsule   Oral   Take 300 mg by mouth 2 (two) times daily.          Marland Kitchen glyBURIDE-metformin (GLUCOVANCE) 5-500 MG per tablet   Oral   Take 1 tablet by mouth 2 (two) times daily with a meal.          . hydrochlorothiazide (MICROZIDE) 12.5 MG  capsule   Oral   Take 12.5 mg by mouth every morning.          Marland Kitchen HYDROcodone-acetaminophen (NORCO) 5-325 MG per tablet   Oral   Take 1-2 tablets by mouth every 6 (six) hours as needed for pain.   50 tablet   1   . MAGNESIUM-ZINC PO   Oral   Take 1 tablet by mouth 2 (two) times daily.         . methocarbamol (ROBAXIN) 500 MG tablet   Oral   Take 500 mg by mouth 2 (two) times daily.          . methocarbamol (ROBAXIN) 500 MG tablet   Oral   Take 1 tablet (500 mg total) by mouth 4 (four) times daily. As needed for muscle spasm   30 tablet   1   . Multiple Vitamin (MULTIVITAMIN WITH MINERALS) TABS   Oral   Take 1 tablet by mouth daily.         . Omega-3 Fatty Acids (FISH OIL) 1200 MG CAPS   Oral   Take 1,200 mg by mouth 2 (two) times daily.         . Potassium 99 MG TABS   Oral   Take 99 mg by mouth daily.         . pravastatin (PRAVACHOL) 40 MG tablet   Oral   Take 40 mg by mouth at bedtime.         . Saw Palmetto, Serenoa repens, 320 MG CAPS   Oral   Take 320 mg by mouth at bedtime.         . sitaGLIPtin (JANUVIA) 100 MG tablet   Oral   Take 100 mg by mouth at bedtime.          . vitamin E 400 UNIT capsule   Oral   Take 800 Units by mouth daily.          Triage Vitals: BP 129/56  Pulse 96  Temp(Src) 103.1 F (39.5 C) (Oral)  Resp 18  Ht 5\' 10"  (1.778 m)  Wt 235 lb (106.595 kg)  BMI 33.72 kg/m2  SpO2 92%  Physical Exam  Nursing note and vitals reviewed. Constitutional: He is oriented to person, place, and time. He appears well-developed and well-nourished. No distress.  HENT:  Head: Normocephalic and atraumatic.  Right Ear: Tympanic membrane, external ear and ear canal normal.  Left Ear: Tympanic membrane, external ear and ear canal normal.  Mouth/Throat: Oropharynx is clear  and moist.  Eyes: Conjunctivae are normal.  Neck: Normal range of motion. Neck supple.  Cardiovascular: Normal rate, regular rhythm and normal heart sounds.   Exam reveals no gallop and no friction rub.   No murmur heard. Pulmonary/Chest: Effort normal. No respiratory distress. He has wheezes. He has no rales.  Scant wheezing.   Abdominal: Soft. He exhibits no distension. There is no tenderness.  Musculoskeletal: Normal range of motion.  Neurological: He is alert and oriented to person, place, and time.  Skin: Skin is warm and dry.  Psychiatric: He has a normal mood and affect. His behavior is normal.    ED Course  Procedures (including critical care time)  DIAGNOSTIC STUDIES: Oxygen Saturation is 92% on room air, adequate by my interpretation.    COORDINATION OF CARE: 7:41 PM- Reviewed x-ray that the patient received earlier today. Discussed that there are no signs of pneumonia at this time. Will order blood labs. Will discharge patient with Levaquin to treat possible respiratory infection. Advised patient to take Tylenol at home until symptoms subside. Discussed treatment plan with patient at bedside and patient verbalized agreement.     Labs Review Labs Reviewed  CBC WITH DIFFERENTIAL - Abnormal; Notable for the following:    RBC 4.05 (*)    Hemoglobin 12.2 (*)    HCT 37.3 (*)    Neutrophils Relative % 87 (*)    Neutro Abs 8.5 (*)    Lymphocytes Relative 5 (*)    Lymphs Abs 0.5 (*)    All other components within normal limits  COMPREHENSIVE METABOLIC PANEL - Abnormal; Notable for the following:    Glucose, Bld 253 (*)    BUN 31 (*)    GFR calc non Af Amer 51 (*)    GFR calc Af Amer 59 (*)    All other components within normal limits   Imaging Review No results found.  EKG Interpretation   None       MDM  No diagnosis found.  Pt with symptoms consistent with bronchitis vs flu.  Febrile here with temps of 103 sent from PCP with CXR without acute infiltrate.  O2 sats remaining stable in the mid 90's.  Pt denies SOB and only scant wheezing on exam.  No signs of breathing difficulty  No signs of pharyngitis, otitis or  abnormal abdominal findings.   Labs reassuring.  Pt tolerating po's.  1 episode of vomiting upon arrival but now feels much better.  Fever improved with tylenol to 101.  Due to sputum change with start on levaquin given hx of pulm fibrosis and COPD however also did not get a flu shot this year and fever and body aches could be due to flu.  Swabbed at PCP but unclear what the results are.   I personally performed the services described in this documentation, which was scribed in my presence.  The recorded information has been reviewed and considered.     Gwyneth Sprout, MD 05/19/13 [phone]

## 2013-05-19 NOTE — ED Notes (Signed)
MD at bedside. 

## 2013-05-19 NOTE — ED Notes (Signed)
Pt vomited x 1 after laying down in the bed and then sitting up. Wife states that pt was dizzy PTA. Pt has equal grips and no facial droop. Speech is clear, alert and oriented.

## 2014-03-16 ENCOUNTER — Ambulatory Visit (INDEPENDENT_AMBULATORY_CARE_PROVIDER_SITE_OTHER): Payer: Medicare Other | Admitting: Ophthalmology

## 2014-03-16 DIAGNOSIS — E1139 Type 2 diabetes mellitus with other diabetic ophthalmic complication: Secondary | ICD-10-CM

## 2014-03-16 DIAGNOSIS — E11319 Type 2 diabetes mellitus with unspecified diabetic retinopathy without macular edema: Secondary | ICD-10-CM

## 2014-03-16 DIAGNOSIS — H2510 Age-related nuclear cataract, unspecified eye: Secondary | ICD-10-CM

## 2014-03-16 DIAGNOSIS — E1165 Type 2 diabetes mellitus with hyperglycemia: Secondary | ICD-10-CM

## 2014-03-16 DIAGNOSIS — I10 Essential (primary) hypertension: Secondary | ICD-10-CM

## 2014-03-16 DIAGNOSIS — H43819 Vitreous degeneration, unspecified eye: Secondary | ICD-10-CM

## 2014-03-16 DIAGNOSIS — H3530 Unspecified macular degeneration: Secondary | ICD-10-CM

## 2014-03-16 DIAGNOSIS — H35039 Hypertensive retinopathy, unspecified eye: Secondary | ICD-10-CM

## 2014-05-19 ENCOUNTER — Ambulatory Visit (INDEPENDENT_AMBULATORY_CARE_PROVIDER_SITE_OTHER)
Admission: RE | Admit: 2014-05-19 | Discharge: 2014-05-19 | Disposition: A | Discharge status: Home / Self Care | Payer: Medicare Other | Source: Ambulatory Visit | Attending: Internal Medicine | Admitting: Internal Medicine

## 2014-05-19 ENCOUNTER — Ambulatory Visit (INDEPENDENT_AMBULATORY_CARE_PROVIDER_SITE_OTHER): Payer: Medicare Other | Admitting: Internal Medicine

## 2014-05-19 ENCOUNTER — Encounter: Payer: Self-pay | Admitting: Internal Medicine

## 2014-05-19 VITALS — BP 112/70 | HR 84 | Ht 70.5 in | Wt 221.0 lb

## 2014-05-19 DIAGNOSIS — R05 Cough: Secondary | ICD-10-CM

## 2014-05-19 DIAGNOSIS — R058 Other specified cough: Secondary | ICD-10-CM

## 2014-05-19 DIAGNOSIS — J329 Chronic sinusitis, unspecified: Secondary | ICD-10-CM | POA: Insufficient documentation

## 2014-05-19 MED ORDER — AMOXICILLIN-POT CLAVULANATE 875-125 MG PO TABS: 1.0000 | ORAL_TABLET | Freq: Two times a day (BID) | ORAL | Status: DC

## 2014-05-19 MED ORDER — PANTOPRAZOLE SODIUM 40 MG PO TBEC: 40.0000 mg | DELAYED_RELEASE_TABLET | Freq: Every day | ORAL | Status: DC

## 2014-05-19 MED ORDER — FAMOTIDINE 20 MG PO TABS: ORAL_TABLET | ORAL | Status: DC

## 2014-05-19 NOTE — Progress Notes (Signed)
Quick Note:  Spoke with pt and notified of results per Dr. Wert. Pt verbalized understanding and denied any questions.  ______ 

## 2014-05-19 NOTE — Patient Instructions (Signed)
Increase the qvar to 80 Take 2 puffs first thing in am and then another 2 puffs about 12 hours later.   Pantoprazole (protonix) 40 mg   Take 30-60 min before first meal of the day and Pepcid 20 mg one bedtime until return to office - this is the best way to tell whether stomach acid is contributing to your problem.    GERD (REFLUX)  is an extremely common cause of respiratory symptoms just like yours , many times with no obvious heartburn at all.    It can be treated with medication, but also with lifestyle changes including avoidance of late meals, excessive alcohol, smoking cessation, and avoid fatty foods, chocolate, peppermint, colas, red wine, and acidic juices such as orange juice.  NO MINT OR MENTHOL PRODUCTS SO NO COUGH DROPS  USE SUGARLESS CANDY INSTEAD (Jolley ranchers or Stover's or Life Savers) or even ice chips will also do - the key is to swallow to prevent all throat clearing. NO OIL BASED VITAMINS - use powdered substitutes.  Augmentin 875 mg take one pill twice daily  X 10 days - take at breakfast and supper with large glass of water.  It would help reduce the usual side effects (diarrhea and yeast infections) if you ate cultured yogurt at lunch.   mucinex dm up to 1200 mg every 12 hours for cough   Please schedule a follow up office visit in 2 weeks, sooner if needed

## 2014-05-19 NOTE — Progress Notes (Signed)
Subjective:    Patient ID: Tim Morgan, male    DOB: [birth year],    MRN: [id number]  HPI   68 yowm never smoker with   onset of coughing x around mid 90s gradually worse with prev dx of copd by ? [place] ? Some better with qvar not better with prednisone and assoc with  rhinitis x 2010 and variable short or breath when coughing especially since Oct 2015 referred by [name] to pulmonary clinic 05/09/14   05/19/2014 1st [facility] office visit/ [name]   Chief Complaint  Patient presents with  . Pulmonary Consult    Referred by Dr. [name] for eval of PF. Pt c/o cough, SOB and congestion for the past 20 yrs- worse x 2 months.  He states that he gets out of breath walking approx 100 yards.  His cough is prod with large amounts of grey/green spuutm.   just finished levaquin and avelox and some better  Better also  p neb  Cough is more day than night assoc with sense of pnds/ remote sinus surgery  Temp and dust changes make the cough worse   No obvious other patterns in day to day or daytime variabilty or assoc   cp or chest tightness, subjective wheeze or  overt  hb symptoms. No unusual exp hx or h/o childhood pna/ asthma or knowledge of premature birth.  Sleeping ok without nocturnal  or early am exacerbation  of respiratory  c/o's or need for noct saba. Also denies any obvious fluctuation of symptoms with weather or environmental changes or other aggravating or alleviating factors except as outlined above   Current Medications, Allergies, Complete Past Medical History, Past Surgical History, Family History, and Social History were reviewed in [employer] record.            Review of Systems  Constitutional: Negative for fever, chills, activity change, appetite change and unexpected weight change.  HENT: Positive for congestion. Negative for dental problem, postnasal drip, rhinorrhea, sneezing, sore throat, trouble swallowing and voice change.     Eyes: Negative for visual disturbance.  Respiratory: Positive for cough and shortness of breath. Negative for choking.   Cardiovascular: Negative for chest pain and leg swelling.  Gastrointestinal: Negative for nausea, vomiting and abdominal pain.  Genitourinary: Negative for difficulty urinating.  Musculoskeletal: Positive for arthralgias.  Skin: Negative for rash.  Psychiatric/Behavioral: Negative for behavioral problems and confusion.       Objective:   Physical Exam   amb wm nad with nasal tone to voice  Wt Readings from Last 3 Encounters:  05/19/14 221 lb (100.245 kg)  05/19/13 235 lb (106.595 kg)  11/16/12 263 lb (119.296 kg)    Vital signs reviewed   HEENT: nl dentition, turbinates, and orophanx. Nl external ear canals without cough reflex   NECK :  without JVD/Nodes/TM/ nl carotid upstrokes bilaterally   LUNGS: no acc muscle use, clear to A and P bilaterally without cough on insp or exp maneuvers   CV:  RRR  no s3 or murmur or increase in P2, no edema   ABD:  soft and nontender with nl excursion in the supine position. No bruits or organomegaly, bowel sounds nl  MS:  warm without deformities, calf tenderness, cyanosis or clubbing  SKIN: warm and dry without lesions    NEURO:  alert, approp, no deficits   CXR  05/19/2014 : There remains borderline enlargement of the cardiac silhouette. There is no evidence of CHF,  pneumonia, nor other acute cardiopulmonary abnormality.           Assessment & Plan:

## 2014-05-21 ENCOUNTER — Encounter: Payer: Self-pay | Admitting: Internal Medicine

## 2014-05-21 NOTE — Assessment & Plan Note (Addendum)
The most common causes of chronic cough in immunocompetent adults include the following: upper airway cough syndrome (UACS), previously referred to as postnasal drip syndrome (PNDS), which is caused by variety of rhinosinus conditions; (2) asthma; (3) GERD; (4) chronic bronchitis from cigarette smoking or other inhaled environmental irritants; (5) nonasthmatic eosinophilic bronchitis; and (6) bronchiectasis.   These conditions, singly or in combination, have accounted for up to 94% of the causes of chronic cough in prospective studies.   Other conditions have constituted no >6% of the causes in prospective studies These have included bronchogenic carcinoma, chronic interstitial pneumonia, sarcoidosis, left ventricular failure, ACEI-induced cough, and aspiration from a condition associated with pharyngeal dysfunction.    Chronic cough is often simultaneously caused by more than one condition. A single cause has been found from 38 to 82% of the time, multiple causes from 18 to 62%. Multiply caused cough has been the result of three diseases up to 42% of the time.       Based on hx and exam, this is   likely: either cough variant asthma (for which qvar excellent choice) or every more likely  Upper airway cough syndrome, so named because it's frequently impossible to sort out how much is  CR/sinusitis with freq throat clearing (which can be related to primary GERD)   vs  causing  secondary (" extra esophageal")  GERD from wide swings in gastric pressure that occur with throat clearing, often  promoting self use of mint and menthol lozenges that reduce the lower esophageal sphincter tone and exacerbate the problem further in a cyclical fashion.   These are the same pts (now being labeled as having "irritable larynx syndrome" by some cough centers) who not infrequently have a history of having failed to tolerate ace inhibitors,  dry powder inhalers or biphosphonates or report having atypical reflux symptoms  that don't respond to standard doses of PPI , and are easily confused as having aecopd or asthma flares by even experienced allergists/ pulmonologists.   The first step is to maximize acid suppression and treat for suspected underlying sinusitis for the acute component of the cough then regroup in 2 weeks while continuing the qvar as is for now  See instructions for specific recommendations which were reviewed directly with the patient who was given a copy with highlighter outlining the key components.   The proper method of use, as well as anticipated side effects, of a metered-dose inhaler are discussed and demonstrated to the patient. Improved effectiveness after extensive coaching during this visit to a level of approximately  75%

## 2014-06-02 ENCOUNTER — Ambulatory Visit: Payer: Medicare Other | Admitting: Internal Medicine

## 2014-06-06 ENCOUNTER — Encounter: Payer: Self-pay | Admitting: Internal Medicine

## 2014-06-06 ENCOUNTER — Ambulatory Visit (INDEPENDENT_AMBULATORY_CARE_PROVIDER_SITE_OTHER): Payer: Medicare Other | Admitting: Internal Medicine

## 2014-06-06 ENCOUNTER — Other Ambulatory Visit: Payer: Medicare Other

## 2014-06-06 DIAGNOSIS — R058 Other specified cough: Secondary | ICD-10-CM

## 2014-06-06 DIAGNOSIS — R05 Cough: Secondary | ICD-10-CM

## 2014-06-06 NOTE — Progress Notes (Signed)
Subjective:    Patient ID: Tim Morgan, male    DOB: 12-26-1932,    MRN: [id number]    Brief patient profile:  72 yowm never smoker with   onset of coughing x around mid 90s gradually worse with prev dx of copd by ? [place] ? Some better with qvar not better with prednisone and assoc with  rhinitis x 2010 and variable short or breath when coughing especially since Oct 2015 referred by [name] to pulmonary clinic 05/09/14    History of Present Illness  05/19/2014 1st [facility] office visit/ Tim Morgan   Chief Complaint  Patient presents with  . Pulmonary Consult    Referred by Dr. [name] for eval of PF. Pt c/o cough, SOB and congestion for the past 20 yrs- worse x 2 months.  He states that he gets out of breath walking approx 100 yards.  His cough is prod with large amounts of grey/green spuutm.   just finished levaquin and avelox and some better  Better also  p neb  Cough is more day than night assoc with sense of pnds/ remote sinus surgery  Temp and dust changes make the cough worse  Laughing makes it worse  Does fine once asleep but lots cough at hs  Increase the qvar to 80 Take 2 puffs first thing in am and then another 2 puffs about 12 hours later.  Pantoprazole (protonix) 40 mg   Take 30-60 min before first meal of the day and Pepcid 20 mg one bedtime   GERD diet  Augmentin 875 mg take one pill twice daily  X 10 days  mucinex dm up to 1200 mg every 12 hours for cough    06/06/2014 f/u ov/Tim Morgan re: chronic cough x 20 years Chief Complaint  Patient presents with  . Follow-up    Pt states cough and SOB are some better since the last visit. No new co's today.   Not limited by breathing from desired activities     No obvious day to day or daytime variabilty or assoc  cp or chest tightness, subjective wheeze overt sinus or hb symptoms. No unusual exp hx or h/o childhood pna/ asthma or knowledge of premature birth.  Sleeping ok without nocturnal  or early am  exacerbation  of respiratory  c/o's or need for noct saba. Also denies any obvious fluctuation of symptoms with weather or environmental changes or other aggravating or alleviating factors except as outlined above   Current Medications, Allergies, Complete Past Medical History, Past Surgical History, Family History, and Social History were reviewed in [employer] record.  ROS  The following are not active complaints unless bolded sore throat, dysphagia, dental problems, itching, sneezing,  nasal congestion or excess/ purulent secretions, ear ache,   fever, chills, sweats, unintended wt loss, pleuritic or exertional cp, hemoptysis,  orthopnea pnd or leg swelling, presyncope, palpitations, heartburn, abdominal pain, anorexia, nausea, vomiting, diarrhea  or change in bowel or urinary habits, change in stools or urine, dysuria,hematuria,  rash, arthralgias, visual complaints, headache, numbness weakness or ataxia or problems with walking or coordination,  change in mood/affect or memory.                          Objective:   Physical Exam   amb wm nad with nasal tone to voice  06/06/2014       220 Wt Readings from Last 3 Encounters:  05/19/14 221 lb (  100.245 kg)  05/19/13 235 lb (106.595 kg)  11/16/12 263 lb (119.296 kg)    Vital signs reviewed   HEENT: nl dentition, turbinates, and orophanx. Nl external ear canals without cough reflex   NECK :  without JVD/Nodes/TM/ nl carotid upstrokes bilaterally   LUNGS: no acc muscle use, clear to A and P bilaterally without cough on insp or exp maneuvers   CV:  RRR  no s3 or murmur or increase in P2, no edema   ABD:  soft and nontender with nl excursion in the supine position. No bruits or organomegaly, bowel sounds nl  MS:  warm without deformities, calf tenderness, cyanosis or clubbing  SKIN: warm and dry without lesions    NEURO:  alert, approp, no deficits   CXR  05/19/2014 : There remains borderline  enlargement of the cardiac silhouette. There is no evidence of CHF, pneumonia, nor other acute cardiopulmonary abnormality.           Assessment & Plan:

## 2014-06-06 NOTE — Patient Instructions (Addendum)
Nexium should be 30 min before bfast and continue pepcid 20 mg at bedtime but stop protonix   Qvar 80 Take 2 puffs first thing in am and then another 2 puffs about 12 hours later.  Please see patient coordinator before you leave today  to schedule  Sinus CT   Please remember to go to the lab department downstairs for your tests - we will call you with the results when they are available.  For drainage take chlortrimeton (chlorpheniramine) 4 mg every 4 hours available over the counter (may cause drowsiness)     Please schedule a follow up office visit in 6 weeks, call sooner if needed

## 2014-06-07 LAB — ALLERGY FULL PROFILE
Alternaria Alternata: <0.1 kU/L
Bahia Grass: <0.1 kU/L
Bermuda Grass: <0.1 kU/L
Cat Dander: <0.1 kU/L
Curvularia lunata: <0.1 kU/L
Dog Dander: <0.1 kU/L
Fescue: <0.1 kU/L
Goldenrod: <0.1 kU/L
Helminthosporium halodes: <0.1 kU/L
Lamb's Quarters: <0.1 kU/L
Plantain: <0.1 kU/L
Sycamore Tree: <0.1 kU/L

## 2014-06-07 NOTE — Progress Notes (Signed)
Subjective:    Patient ID: Tim Morgan, male    DOB: Feb 08, 1933,    MRN: [id number]    Brief patient profile:  18 yowm never smoker with   onset of coughing x around mid 90s gradually worse with prev dx of copd by ? [place] ? Some better with qvar not better with prednisone and assoc with  rhinitis x 2010 and variable short or breath when coughing especially since Oct 2015 referred by [name] to pulmonary clinic 05/09/14    History of Present Illness  05/19/2014 1st [facility] office visit/ Franceska Strahm   Chief Complaint  Patient presents with  . Pulmonary Consult    Referred by Dr. [name] for eval of PF. Pt c/o cough, SOB and congestion for the past 20 yrs- worse x 2 months.  He states that he gets out of breath walking approx 100 yards.  His cough is prod with large amounts of grey/green spuutm.   just finished levaquin and avelox and some better  Better also  p neb  Cough is more day than night assoc with sense of pnds/ remote sinus surgery  Temp and dust changes make the cough worse  Laughing makes it worse  Does fine once asleep but lots cough at hs  Increase the qvar to 80 Take 2 puffs first thing in am and then another 2 puffs about 12 hours later.  Pantoprazole (protonix) 40 mg   Take 30-60 min before first meal of the day and Pepcid 20 mg one bedtime   GERD diet  Augmentin 875 mg take one pill twice daily  X 10 days  mucinex dm up to 1200 mg every 12 hours for cough    06/06/2014 f/u ov/Domonique Brouillard re: chronic cough x 20 years Chief Complaint  Patient presents with  . Follow-up    Pt states cough and SOB are some better since the last visit. No new co's today.   Not limited by breathing from desired activities   Cough improved but still present with using voice/laughing/ mostly dry now/ on qvar 40 4 bid   No obvious day to day or daytime variabilty or assoc  cp or chest tightness, subjective wheeze overt sinus or hb symptoms. No unusual exp hx or h/o childhood  pna/ asthma or knowledge of premature birth.  Sleeping ok without nocturnal  or early am exacerbation  of respiratory  c/o's or need for noct saba. Also denies any obvious fluctuation of symptoms with weather or environmental changes or other aggravating or alleviating factors except as outlined above   Current Medications, Allergies, Complete Past Medical History, Past Surgical History, Family History, and Social History were reviewed in [employer] record.  ROS  The following are not active complaints unless bolded sore throat, dysphagia, dental problems, itching, sneezing,  nasal congestion or excess/ purulent secretions, ear ache,   fever, chills, sweats, unintended wt loss, pleuritic or exertional cp, hemoptysis,  orthopnea pnd or leg swelling, presyncope, palpitations, heartburn, abdominal pain, anorexia, nausea, vomiting, diarrhea  or change in bowel or urinary habits, change in stools or urine, dysuria,hematuria,  rash, arthralgias, visual complaints, headache, numbness weakness or ataxia or problems with walking or coordination,  change in mood/affect or memory.                            Objective:   Physical Exam   amb wm nad with nasal tone to voice  06/06/2014       220 Wt Readings from Last 3 Encounters:  05/19/14 221 lb (100.245 kg)  05/19/13 235 lb (106.595 kg)  11/16/12 263 lb (119.296 kg)    Vital signs reviewed   HEENT: nl dentition, turbinates, and orophanx. Nl external ear canals without cough reflex   NECK :  without JVD/Nodes/TM/ nl carotid upstrokes bilaterally   LUNGS: no acc muscle use, clear to A and P bilaterally without cough on insp or exp maneuvers   CV:  RRR  no s3 or murmur or increase in P2, no edema   ABD:  soft and nontender with nl excursion in the supine position. No bruits or organomegaly, bowel sounds nl  MS:  warm without deformities, calf tenderness, cyanosis or clubbing  SKIN: warm and dry without lesions     NEURO:  alert, approp, no deficits   CXR  05/19/2014 : There remains borderline enlargement of the cardiac silhouette. There is no evidence of CHF, pneumonia, nor other acute cardiopulmonary abnormality.           Assessment & Plan:

## 2014-06-08 ENCOUNTER — Ambulatory Visit (INDEPENDENT_AMBULATORY_CARE_PROVIDER_SITE_OTHER)
Admission: RE | Admit: 2014-06-08 | Discharge: 2014-06-08 | Disposition: A | Discharge status: Home / Self Care | Payer: Medicare Other | Source: Ambulatory Visit | Attending: Internal Medicine | Admitting: Internal Medicine

## 2014-06-08 DIAGNOSIS — R05 Cough: Secondary | ICD-10-CM

## 2014-06-08 DIAGNOSIS — R058 Other specified cough: Secondary | ICD-10-CM

## 2014-06-09 NOTE — Assessment & Plan Note (Signed)
-   allergy profile 06/05/14 >>> IgE 2, neg RAST Eos neg - sinus ct  06/08/2014 Postsurgical changes as described. 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 Possible superimposed acute left maxillary sinus disease   Still not clear to what extent this is asthma vs more likely  Classic Upper airway cough syndrome, so named because it's frequently impossible to sort out how much is  CR/sinusitis with freq throat clearing (which can be related to primary GERD)   vs  causing  secondary (" extra esophageal")  GERD from wide swings in gastric pressure that occur with throat clearing, often  promoting self use of mint and menthol lozenges that reduce the lower esophageal sphincter tone and exacerbate the problem further in a cyclical fashion.   These are the same pts (now being labeled as having "irritable larynx syndrome" by some cough centers) who not infrequently have a history of having failed to tolerate ace inhibitors,  dry powder inhalers or biphosphonates or report having atypical reflux symptoms that don't respond to standard doses of PPI , and are easily confused as having aecopd or asthma flares by even experienced allergists/ pulmonologists.   For now continue qvar 80 2 bid and rx for sinus dz/ gerd then regroup   The proper method of use, as well as anticipated side effects, of a metered-dose inhaler are discussed and demonstrated to the patient. Improved effectiveness after extensive coaching during this visit to a level of approximately  75%     Each maintenance medication was reviewed in detail including most importantly the difference between maintenance and as needed and under what circumstances the prns are to be used.  Please see instructions for details which were reviewed in writing and the patient given a copy.

## 2014-06-10 ENCOUNTER — Other Ambulatory Visit: Payer: Self-pay | Admitting: Internal Medicine

## 2014-06-10 MED ORDER — AMOXICILLIN-POT CLAVULANATE 875-125 MG PO TABS: 1.0000 | ORAL_TABLET | Freq: Two times a day (BID) | ORAL | Status: DC

## 2014-06-10 NOTE — Progress Notes (Signed)
Quick Note:  Spoke with pt and notified of results per Dr. Wert. Pt verbalized understanding and denied any questions.  ______ 

## 2014-07-25 ENCOUNTER — Encounter: Payer: Self-pay | Admitting: Internal Medicine

## 2014-07-25 ENCOUNTER — Ambulatory Visit (INDEPENDENT_AMBULATORY_CARE_PROVIDER_SITE_OTHER): Payer: Medicare Other | Admitting: Internal Medicine

## 2014-07-25 VITALS — BP 112/80 | HR 82 | Ht 70.5 in | Wt 217.2 lb

## 2014-07-25 DIAGNOSIS — R058 Other specified cough: Secondary | ICD-10-CM

## 2014-07-25 DIAGNOSIS — R05 Cough: Secondary | ICD-10-CM

## 2014-07-25 MED ORDER — AMOXICILLIN-POT CLAVULANATE 875-125 MG PO TABS: 1.0000 | ORAL_TABLET | Freq: Two times a day (BID) | ORAL | Status: DC

## 2014-07-25 MED ORDER — FAMOTIDINE 20 MG PO TABS: ORAL_TABLET | ORAL | Status: DC

## 2014-07-25 NOTE — Patient Instructions (Addendum)
Augmentin 875 mg take one pill twice daily  X 20  days - take at breakfast and supper with large glass of water.  It would help reduce the usual side effects (diarrhea and yeast infections) if you ate cultured yogurt at lunch.   Please see patient coordinator before you leave today  to schedule sinus CT in 3 weeks - not sooner   Take either the pantoprazole or nexium before bfast daily but not both   For drippy nose, drainage For drainage take chlortrimeton (chlorpheniramine) 4 mg up to every 4 hours available over the counter (may cause drowsiness)   For cough/congestion > mucinex dm up to 1200 mg every 12 hours

## 2014-07-25 NOTE — Assessment & Plan Note (Signed)
-   allergy profile 06/05/14 >>> IgE 2, neg RAST Eos neg - sinus ct  06/08/2014 Postsurgical changes as described. 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 Possible superimposed acute left maxillary sinus disease> rec augmentin x 10 days> temporarily improved   I had an extended discussion with the patient and wife reviewing all relevant studies completed to date and  lasting 15 to 20 minutes of a 25 minute visit on the following ongoing concerns:   1) response to augmentin suggests sinusitis was present but ? Whether this is the only problem and whether doubling the length will help or will need ent input > for now try 20 days rx then CT sinus on Day 21 and refer to ent then prn  2)  for now should continue max gerd rx and qvar until we get a handle on the 20 year cough and then do de-escalation once better   3) The proper method of use, as well as anticipated side effects, of a metered-dose inhaler are discussed and demonstrated to the patient. Improved effectiveness after extensive coaching during this visit to a level of approximately  90%   4) Each maintenance medication was reviewed in detail including most importantly the difference between maintenance and as needed and under what circumstances the prns are to be used.  Please see instructions for details which were reviewed in writing and the patient given a copy.

## 2014-07-25 NOTE — Progress Notes (Signed)
Subjective:    Patient ID: Tim Morgan, male    DOB: January 20, 1933,    MRN: [id number]    Brief patient profile:  48 yowm never smoker with   onset of coughing x around mid 90s gradually worse with prev dx of copd by ? [place] ? Some better with qvar not better with prednisone and assoc with  rhinitis x 2010 and variable short or breath when coughing especially since Oct 2015 referred by [name] to pulmonary clinic 05/09/14    History of Present Illness  05/19/2014 1st [facility] office visit/ [place]   Chief Complaint  Patient presents with  . Pulmonary Consult    Referred by Dr. [name] for eval of PF. Pt c/o cough, SOB and congestion for the past 20 yrs- worse x 2 months.  He states that he gets out of breath walking approx 100 yards.  His cough is prod with large amounts of grey/green spuutm.   just finished levaquin and avelox and some better  Better also  p neb  Cough is more day than night assoc with sense of pnds/ remote sinus surgery  Temp and dust changes make the cough worse  Laughing makes it worse  Does fine once asleep but lots cough at hs  Increase the qvar to 80 Take 2 puffs first thing in am and then another 2 puffs about 12 hours later.  Pantoprazole (protonix) 40 mg   Take 30-60 min before first meal of the day and Pepcid 20 mg one bedtime   GERD diet  Augmentin 875 mg take one pill twice daily  X 10 days  mucinex dm up to 1200 mg every 12 hours for cough    06/06/2014 f/u ov/[place] re: chronic cough x 20 years Chief Complaint  Patient presents with  . Follow-up    Pt states cough and SOB are some better since the last visit. No new co's today.   Not limited by breathing from desired activities   Cough improved but still present with using voice/laughing/ mostly dry now/ on qvar 40 4 bid  rec Nexium should be 30 min before bfast and continue pepcid 20 mg at bedtime but stop protonix  Qvar 80 Take 2 puffs first thing in am and then another 2 puffs  about 12 hours later. Please see patient coordinator before you leave today  to schedule  Sinus CT > L sinusitis > rx x 10 days augmentin  Please remember to go to the lab department downstairs for your tests - we will call you with the results when they are available. For drainage take chlortrimeton (chlorpheniramine) 4 mg every 4 hours available over the counter (may cause drowsiness)    07/25/2014 f/u ov/[facility] re: cough x 20 years  Chief Complaint  Patient presents with  . Follow-up    Pt states that his breathing may be slightly improved since his last visit. No new co's today.   much better wihile on augmentin  Worse with laughing or with temp changes Coughing takes his breath away but also doe even if walk across [place] with prns   No obvious day to day or daytime variabilty or assoc  cp or chest tightness, subjective wheeze overt sinus or hb symptoms. No unusual exp hx or h/o childhood pna/ asthma or knowledge of premature birth.  Sleeping ok without nocturnal  or early am exacerbation  of respiratory  c/o's or need for noct saba. Also denies any obvious fluctuation of  symptoms with weather or environmental changes or other aggravating or alleviating factors except as outlined above   Current Medications, Allergies, Complete Past Medical History, Past Surgical History, Family History, and Social History were reviewed in [employer] record.  ROS  The following are not active complaints unless bolded sore throat, dysphagia, dental problems, itching, sneezing,  nasal congestion or excess/ purulent secretions, ear ache,   fever, chills, sweats, unintended wt loss, pleuritic or exertional cp, hemoptysis,  orthopnea pnd or leg swelling, presyncope, palpitations, heartburn, abdominal pain, anorexia, nausea, vomiting, diarrhea  or change in bowel or urinary habits, change in stools or urine, dysuria,hematuria,  rash, arthralgias, visual complaints, headache,  numbness weakness or ataxia or problems with walking or coordination,  change in mood/affect or memory.                            Objective:   Physical Exam   amb wm nad with nasal tone to voice/ very congested sounding cough   06/06/2014       220 > 07/25/2014   217  Wt Readings from Last 3 Encounters:  05/19/14 221 lb (100.245 kg)  05/19/13 235 lb (106.595 kg)  11/16/12 263 lb (119.296 kg)    Vital signs reviewed   HEENT: nl dentition, turbinates, and orophanx. Nl external ear canals without cough reflex   NECK :  without JVD/Nodes/TM/ nl carotid upstrokes bilaterally   LUNGS: no acc muscle use, min insp and exp rhonchi bilaterally    CV:  RRR  no s3 or murmur or increase in P2, no edema   ABD:  soft and nontender with nl excursion in the supine position. No bruits or organomegaly, bowel sounds nl  MS:  warm without deformities, calf tenderness, cyanosis or clubbing  SKIN: warm and dry without lesions    NEURO:  alert, approp, no deficits   CXR  05/19/2014 : There remains borderline enlargement of the cardiac silhouette. There is no evidence of CHF, pneumonia, nor other acute cardiopulmonary abnormality.           Assessment & Plan:

## 2014-08-15 ENCOUNTER — Ambulatory Visit (INDEPENDENT_AMBULATORY_CARE_PROVIDER_SITE_OTHER)
Admission: RE | Admit: 2014-08-15 | Discharge: 2014-08-15 | Disposition: A | Discharge status: Home / Self Care | Payer: Medicare Other | Source: Ambulatory Visit | Attending: Internal Medicine | Admitting: Internal Medicine

## 2014-08-15 DIAGNOSIS — R058 Other specified cough: Secondary | ICD-10-CM

## 2014-08-15 DIAGNOSIS — R05 Cough: Secondary | ICD-10-CM

## 2014-08-16 NOTE — Progress Notes (Signed)
Quick Note:  LMTCB ______ 

## 2014-08-16 NOTE — Progress Notes (Signed)
Quick Note:  Spoke with pt and notified of results per Dr. Wert. Pt verbalized understanding and denied any questions.  ______ 

## 2014-08-17 ENCOUNTER — Encounter: Payer: Self-pay | Admitting: Internal Medicine

## 2014-08-17 ENCOUNTER — Ambulatory Visit (INDEPENDENT_AMBULATORY_CARE_PROVIDER_SITE_OTHER): Payer: Medicare Other | Admitting: Internal Medicine

## 2014-08-17 VITALS — BP 106/60 | HR 88 | Ht 70.5 in | Wt 214.0 lb

## 2014-08-17 DIAGNOSIS — J329 Chronic sinusitis, unspecified: Secondary | ICD-10-CM

## 2014-08-17 DIAGNOSIS — J4530 Mild persistent asthma, uncomplicated: Secondary | ICD-10-CM | POA: Insufficient documentation

## 2014-08-17 NOTE — Progress Notes (Signed)
Subjective:    Patient ID: Tim Morgan, male    DOB: July 12, 1932,    MRN: [id number]    Brief patient profile:  32 yowm never smoker with onset of coughing x around mid 90s gradually worse with prev dx of copd by ? [place] ? Some better with qvar not better with prednisone and assoc with  rhinitis x 2010 and variable short or breath when coughing especially since Oct 2015 referred by [name] to pulmonary clinic 05/09/14 with dx of mild chronic asthma ? Related to chronic sinusitis +/- GERD   History of Present Illness  05/19/2014 1st [facility] office visit/ W[facility]   Chief Complaint  Patient presents with  . Pulmonary Consult    Referred by Dr. [name] for eval of PF. Pt c/o cough, SOB and congestion for the past 20 yrs- worse x 2 months.  He states that he gets out of breath walking approx 100 yards.  His cough is prod with large amounts of grey/green spuutm.   just finished levaquin and avelox and some better  Better also  p neb  Cough is more day than night assoc with sense of pnds/ remote sinus surgery  Temp and dust changes make the cough worse  Laughing makes it worse  Does fine once asleep but lots cough at hs  Increase the qvar to 80 Take 2 puffs first thing in am and then another 2 puffs about 12 hours later.  Pantoprazole (protonix) 40 mg   Take 30-60 min before first meal of the day and Pepcid 20 mg one bedtime   GERD diet  Augmentin 875 mg take one pill twice daily  X 10 days  mucinex dm up to 1200 mg every 12 hours for cough    06/06/2014 f/u ov/[place] re: chronic cough x 20 years Chief Complaint  Patient presents with  . Follow-up    Pt states cough and SOB are some better since the last visit. No new co's today.   Not limited by breathing from desired activities   Cough improved but still present with using voice/laughing/ mostly dry now/ on qvar 40 4 bid  rec Nexium should be 30 min before bfast and continue pepcid 20 mg at bedtime but stop  protonix  Qvar 80 Take 2 puffs first thing in am and then another 2 puffs about 12 hours later. Please see patient coordinator before you leave today  to schedule  Sinus CT > L sinusitis > rx x 10 days augmentin  Please remember to go to the lab department downstairs for your tests - we will call you with the results when they are available. For drainage take chlortrimeton (chlorpheniramine) 4 mg every 4 hours available over the counter (may cause drowsiness)    07/25/2014 f/u ov/[facility] re: cough x 20 years  Chief Complaint  Patient presents with  . Follow-up    Pt states that his breathing may be slightly improved since his last visit. No new co's today.   much better while on augmentin  Worse with laughing or with temp changes Coughing takes his breath away but also doe even if walk across [place] with prns  rec Augmentin 875 mg take one pill twice daily  X 20  days  .  Please see patient coordinator before you leave today  to schedule sinus CT in 3 weeks > still sinusitis  Take either the pantoprazole or nexium before bfast daily but not both  For drippy nose,  drainage For drainage take chlortrimeton (chlorpheniramine) 4 mg up to every 4 hours available over the counter (may cause drowsiness)  For cough/congestion > mucinex dm up to 1200 mg every 12 hours   08/17/2014 f/u ov/[facility]ert re: 44 y cough/ sob / acute on chronic sinusitis  Chief Complaint  Patient presents with  . Follow-up    Pt states that his cough has improved since his last visit. No new co's today.   On qvar 80 2bid no need for neb sba  at all  Breathing better > able to walk [employer]/ causes some coughing/ can't do whole store s stopping sev times  Some noct cough no longer on pepcic at hs    No obvious day to day or daytime variabilty or assoc    cp or chest tightness, subjective wheeze or overt  hb symptoms. No unusual exp hx or h/o childhood pna/ asthma or knowledge of premature birth.  Sleeping ok without  nocturnal  or early am exacerbation  of respiratory  c/o's or need for noct saba. Also denies any obvious fluctuation of symptoms with weather or environmental changes or other aggravating or alleviating factors except as outlined above   Current Medications, Allergies, Complete Past Medical History, Past Surgical History, Family History, and Social History were reviewed in [employer] record.  ROS  The following are not active complaints unless bolded sore throat, dysphagia, dental problems, itching, sneezing,  nasal congestion or excess/ purulent secretions, ear ache,   fever, chills, sweats, unintended wt loss, pleuritic or exertional cp, hemoptysis,  orthopnea pnd or leg swelling, presyncope, palpitations, heartburn, abdominal pain, anorexia, nausea, vomiting, diarrhea  or change in bowel or urinary habits, change in stools or urine, dysuria,hematuria,  rash, arthralgias, visual complaints, headache, numbness weakness or ataxia or problems with walking or coordination,  change in mood/affect or memory.                            Objective:   Physical Exam   amb wm nad with nasal tone to voice improved but still not nl   06/06/2014       220 > 07/25/2014   217 > 08/17/2014 215  Wt Readings from Last 3 Encounters:  05/19/14 221 lb (100.245 kg)  05/19/13 235 lb (106.595 kg)  11/16/12 263 lb (119.296 kg)    Vital signs reviewed   HEENT: nl dentition, turbinates, and orophanx. Nl external ear canals without cough reflex   NECK :  without JVD/Nodes/TM/ nl carotid upstrokes bilaterally   LUNGS: no acc muscle use, very min bilateral insp and exp  rhonchi s cough now on insp or exp    CV:  RRR  no s3 or murmur or increase in P2, no edema   ABD:  soft and nontender with nl excursion in the supine position. No bruits or organomegaly, bowel sounds nl  MS:  warm without deformities, calf tenderness, cyanosis or clubbing  SKIN: warm and dry without lesions     NEURO:  alert, approp, no deficits   CXR  05/19/2014 : There remains borderline enlargement of the cardiac silhouette. There is no evidence of CHF, pneumonia, nor other acute cardiopulmonary abnormality.           Assessment & Plan:

## 2014-08-17 NOTE — Patient Instructions (Addendum)
See your ENT doctor Laurance Flatten) but you will need to pick up your sinus CT before you go and let him know we treated you with 3 weeks of Augmentin before the second CT and it didn't work  very effectively   Add back pepcid 20 mg one at bedtime  Please schedule a follow up office visit in 6 weeks, call sooner if needed for pfts on return

## 2014-08-18 NOTE — Assessment & Plan Note (Addendum)
-   08/17/2014  Walked RA x 3 laps @ 185 ft each stopped due to  Thigh pain bilaterally, nl pace, min sob, no desats    The proper method of use, as well as anticipated side effects, of a metered-dose inhaler are discussed and demonstrated to the patient. Improved effectiveness after extensive coaching during this visit to a level of approximately  90%    I had an extended discussion with the patient reviewing all relevant studies completed to date and  lasting 15 to 20 minutes of a 25 minute visit on the following ongoing concerns:  1) this is asthma in a never smoker with unresolved sinus dz (therefore may have a sinobronchial reflex active) but certainly not copd as prev dx'd in [place], but needs pfts to sort this out.  Note, however, that he is not limited by airflow but rather leg pains typical of deconditioning.  2) key to control is eliminated the sinus dz (see sep a/p) which causes the cough which may be causing LPR/gerd and contributing to the cycle of coughing  3) for now rec continue qvar 80 2bid  4) offered to help with med reconciliation if he / wife desire (they have a very confusing and not updated spreadsheet that does not separate out maint mvs prns   See instructions for specific recommendations which were reviewed directly with the patient who was given a copy with highlighter outlining the key components.

## 2014-08-18 NOTE — Assessment & Plan Note (Signed)
-   allergy profile 06/05/14 >>> IgE 2, neg RAST Eos neg - sinus ct  06/08/2014 Postsurgical changes as described. 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 Possible superimposed acute left maxillary sinus disease> rec augmentin x 10 days> temporarily improved  - augmentin x 20 days 07/25/2014 then sinus CT > 08/15/14 > There is mucosal thickening and likely fluid in both maxillary sinuses, more extensive on the right> 08/16/2014 rec  needs ent re-eval by Dr Laurance Flatten in [facility]

## 2014-09-09 ENCOUNTER — Ambulatory Visit (INDEPENDENT_AMBULATORY_CARE_PROVIDER_SITE_OTHER)
Admission: RE | Admit: 2014-09-09 | Discharge: 2014-09-09 | Disposition: A | Discharge status: Home / Self Care | Payer: Medicare Other | Source: Ambulatory Visit | Attending: Internal Medicine | Admitting: Internal Medicine

## 2014-09-09 ENCOUNTER — Ambulatory Visit (INDEPENDENT_AMBULATORY_CARE_PROVIDER_SITE_OTHER): Payer: Medicare Other | Admitting: Internal Medicine

## 2014-09-09 ENCOUNTER — Encounter: Payer: Self-pay | Admitting: Internal Medicine

## 2014-09-09 VITALS — BP 90/60 | HR 81 | Temp 97.7°F | Ht 70.5 in | Wt 212.6 lb

## 2014-09-09 DIAGNOSIS — J4530 Mild persistent asthma, uncomplicated: Secondary | ICD-10-CM

## 2014-09-09 DIAGNOSIS — R05 Cough: Secondary | ICD-10-CM

## 2014-09-09 DIAGNOSIS — I10 Essential (primary) hypertension: Secondary | ICD-10-CM

## 2014-09-09 DIAGNOSIS — R059 Cough, unspecified: Secondary | ICD-10-CM

## 2014-09-09 DIAGNOSIS — J329 Chronic sinusitis, unspecified: Secondary | ICD-10-CM | POA: Diagnosis not present

## 2014-09-09 MED ORDER — TRAMADOL HCL 50 MG PO TABS: ORAL_TABLET | ORAL | Status: DC

## 2014-09-09 MED ORDER — AMOXICILLIN-POT CLAVULANATE 875-125 MG PO TABS: ORAL_TABLET | ORAL | Status: DC

## 2014-09-09 NOTE — Progress Notes (Signed)
Subjective:    Patient ID: Tim Morgan, male    DOB: [birth year],    MRN: [id number]    Brief patient profile:  12 yowm never smoker with onset of coughing x around mid 90s gradually worse with prev dx of copd by ? [place] ? Some better with qvar not better with prednisone and assoc with  rhinitis x 2010 and variable short or breath when coughing especially since Oct 2015 referred by [name] to pulmonary clinic 05/09/14 with dx of mild chronic asthma ? Related to chronic sinusitis +/- GERD   History of Present Illness  05/19/2014 1st [facility] office visit/ Tim Morgan   Chief Complaint  Patient presents with  . Pulmonary Consult    Referred by Dr. [name] for eval of PF. Pt c/o cough, SOB and congestion for the past 20 yrs- worse x 2 months.  He states that he gets out of breath walking approx 100 yards.  His cough is prod with large amounts of grey/green spuutm.   just finished levaquin and avelox and some better  Better also  p neb  Cough is more day than night assoc with sense of pnds/ remote sinus surgery  Temp and dust changes make the cough worse  Laughing makes it worse  Does fine once asleep but lots cough at hs  Increase the qvar to 80 Take 2 puffs first thing in am and then another 2 puffs about 12 hours later.  Pantoprazole (protonix) 40 mg   Take 30-60 min before first meal of the day and Pepcid 20 mg one bedtime   GERD diet  Augmentin 875 mg take one pill twice daily  X 10 days  mucinex dm up to 1200 mg every 12 hours for cough    06/06/2014 f/u ov/Tim Morgan re: chronic cough x 20 years Chief Complaint  Patient presents with  . Follow-up    Pt states cough and SOB are some better since the last visit. No new co's today.   Not limited by breathing from desired activities   Cough improved but still present with using voice/laughing/ mostly dry now/ on qvar 40 4 bid  rec Nexium should be 30 min before bfast and continue pepcid 20 mg at bedtime but stop  protonix  Qvar 80 Take 2 puffs first thing in am and then another 2 puffs about 12 hours later. Please see patient coordinator before you leave today  to schedule  Sinus CT > L sinusitis > rx x 10 days augmentin  Please remember to go to the lab department downstairs for your tests - we will call you with the results when they are available. For drainage take chlortrimeton (chlorpheniramine) 4 mg every 4 hours available over the counter (may cause drowsiness)    07/25/2014 f/u ov/Tim Morgan re: cough x 20 years  Chief Complaint  Patient presents with  . Follow-up    Pt states that his breathing may be slightly improved since his last visit. No new co's today.   much better while on augmentin  Worse with laughing or with temp changes Coughing takes his breath away but also doe even if walk across [place] with prns  rec Augmentin 875 mg take one pill twice daily  X 20  days  .  Please see patient coordinator before you leave today  to schedule sinus CT in 3 weeks > still sinusitis  Take either the pantoprazole or nexium before bfast daily but not both  For drippy nose,  drainage For drainage take chlortrimeton (chlorpheniramine) 4 mg up to every 4 hours available over the counter (may cause drowsiness)  For cough/congestion > mucinex dm up to 1200 mg every 12 hours   08/17/2014 f/u ov/Tim Morgan re: 12 y cough/ sob / acute on chronic sinusitis  Chief Complaint  Patient presents with  . Follow-up    Pt states that his cough has improved since his last visit. No new co's today.   On qvar 80 2bid no need for neb saba  at all  Breathing better > able to walk [employer]/ causes some coughing/ can't do whole store s stopping sev times  Some noct cough no longer on pepcic at hs  rec See your ENT doctor Laurance Flatten) but you will need to pick up your sinus CT before you go and let him know we treated you with 3 weeks of Augmentin before the second CT and it didn't work  very effectively > rec sinus rinses  Add  back pepcid 20 mg one at bedtime   09/09/2014 f/u ov/Tim Morgan re: flare of cough  Chief Complaint  Patient presents with  . Acute Visit    Pt has had cough with grey mucus, head and chest congestion,runny nose and sneezing.Pt has severe fatigue  started gradually  worse cough/ nasal and chest congestion a few days  after last ov despite pepcid at hs  And sinus rinses from ent  Has not restarted the neb as doesn't think he needs it    No obvious day to day or daytime variabilty or assoc    cp or chest tightness, subjective wheeze or overt  hb symptoms. No unusual exp hx or h/o childhood pna/ asthma or knowledge of premature birth.  Sleeping ok without nocturnal  or early am exacerbation  of respiratory  c/o's or need for noct saba. Also denies any obvious fluctuation of symptoms with weather or environmental changes or other aggravating or alleviating factors except as outlined above   Current Medications, Allergies, Complete Past Medical History, Past Surgical History, Family History, and Social History were reviewed in [employer] record.  ROS  The following are not active complaints unless bolded sore throat, dysphagia, dental problems, itching, sneezing,  nasal congestion or excess/ purulent secretions, ear ache,   fever, chills, sweats, unintended wt loss, pleuritic or exertional cp, hemoptysis,  orthopnea pnd or leg swelling, presyncope, palpitations, heartburn, abdominal pain, anorexia, nausea, vomiting, diarrhea  or change in bowel or urinary habits, change in stools or urine, dysuria,hematuria,  rash, arthralgias, visual complaints, headache, numbness weakness or ataxia or problems with walking or coordination,  change in mood/affect or memory.                             Objective:   Physical Exam   amb wm nad with nasal tone to voice     06/06/2014       220 > 07/25/2014   217 > 08/17/2014 215 > 09/09/14  Wt Readings from Last 3 Encounters:  05/19/14  221 lb (100.245 kg)  05/19/13 235 lb (106.595 kg)  11/16/12 263 lb (119.296 kg)    Vital signs reviewed   HEENT: nl dentition, turbinates, and orophanx. Nl external ear canals without cough reflex   NECK :  without JVD/Nodes/TM/ nl carotid upstrokes bilaterally   LUNGS: no acc muscle use, inps pops/ squeaks bilaterally  on insp but no cough / min exp rhonchi  CV:  RRR  no s3 or murmur or increase in P2, no edema   ABD:  soft and nontender with nl excursion in the supine position. No bruits or organomegaly, bowel sounds nl  MS:  warm without deformities, calf tenderness, cyanosis or clubbing  SKIN: warm and dry without lesions    NEURO:  alert, approp, no deficits     CXR PA and Lateral:   09/09/2014 :     I personally reviewed images and agree with radiology impression as follows:   Mild cardiac enlargement. No pleural effusion or edema. Both lungs are clear. Spondylosis noted within the thoracic spine          Assessment & Plan:

## 2014-09-09 NOTE — Patient Instructions (Signed)
Augmentin 875 mg take one pill twice daily  X 20 days - take at breakfast and supper with large glass of water.  It would help reduce the usual side effects (diarrhea and yeast infections) if you ate cultured yogurt at lunch.   Stop hydrochlorthiazide  For breathing > use nebulizer up to every 4 hours as needed   For cough > mucinex dm 1200 every 12 hours and supplement with tramadol 50 mg one every 4 hours   Please remember to go to the x-ray department downstairs for your tests - we will call you with the results when they are available.

## 2014-09-10 ENCOUNTER — Encounter: Payer: Self-pay | Admitting: Internal Medicine

## 2014-09-10 DIAGNOSIS — I10 Essential (primary) hypertension: Secondary | ICD-10-CM | POA: Insufficient documentation

## 2014-09-10 DIAGNOSIS — R05 Cough: Secondary | ICD-10-CM | POA: Insufficient documentation

## 2014-09-10 DIAGNOSIS — R058 Other specified cough: Secondary | ICD-10-CM | POA: Insufficient documentation

## 2014-09-10 NOTE — Assessment & Plan Note (Signed)
-   08/17/2014 p extensive coaching HFA effectiveness =    90%  - 08/17/2014  Walked RA x 3 laps @ 185 ft each stopped due to  Thigh pain bilaterally, nl pace, min sob, no desats  - PFT's rec 08/18/2014 >>>    Reminded re appropriate use of saba    Each maintenance medication was reviewed in detail including most importantly the difference between maintenance and as needed and under what circumstances the prns are to be used.  Please see instructions for details which were reviewed in writing and the patient given a copy.

## 2014-09-10 NOTE — Assessment & Plan Note (Signed)
-   allergy profile 06/05/14 >>> IgE 2, neg RAST Eos neg - sinus ct  06/08/2014 Postsurgical changes as described. 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 Possible superimposed acute left maxillary sinus disease> rec augmentin x 10 days> temporarily improved  - augmentin x 20 days 07/25/2014 then sinus CT > 08/15/14 > There is mucosal thickening and likely fluid in both maxillary sinuses, more extensive on the right> 08/16/2014 rec  needs ent re-eval> done by DR Laurance Flatten 08/2014 rec nx rinses - flare 08/2014 > rec augmentin x 20 days

## 2014-09-10 NOTE — Assessment & Plan Note (Signed)
Due to combination of sinusitis/ asthma > rec max mucinex dm supplement prn with tramadol

## 2014-09-10 NOTE — Assessment & Plan Note (Signed)
Says he is now on strict no salt diet and light headed standing up too quickly  rec D/c microzide  For now/ ok to liberalize salt for a few days until symptomatic orthostasis resolves

## 2014-09-12 NOTE — Progress Notes (Signed)
Quick Note:  Spoke with pt and notified of results per Dr. Wert. Pt verbalized understanding and denied any questions.  ______ 

## 2014-10-03 ENCOUNTER — Ambulatory Visit (INDEPENDENT_AMBULATORY_CARE_PROVIDER_SITE_OTHER): Payer: Medicare Other | Admitting: Internal Medicine

## 2014-10-03 ENCOUNTER — Encounter: Payer: Self-pay | Admitting: Internal Medicine

## 2014-10-03 VITALS — BP 106/70 | HR 86 | Ht 69.5 in | Wt 217.0 lb

## 2014-10-03 DIAGNOSIS — R058 Other specified cough: Secondary | ICD-10-CM

## 2014-10-03 DIAGNOSIS — J4530 Mild persistent asthma, uncomplicated: Secondary | ICD-10-CM | POA: Diagnosis not present

## 2014-10-03 DIAGNOSIS — R05 Cough: Secondary | ICD-10-CM

## 2014-10-03 DIAGNOSIS — J320 Chronic maxillary sinusitis: Secondary | ICD-10-CM | POA: Diagnosis not present

## 2014-10-03 LAB — PULMONARY FUNCTION TEST
DL/VA % PRED: 99 %
DL/VA: 4.51 ml/min/mmHg/L
DLCO unc % pred: 77 %
DLCO unc: 24.58 ml/min/mmHg
FEF 25-75 POST: 1.78 L/s
FEF 25-75 PRE: 2.09 L/s
FEF2575-%CHANGE-POST: -14 %
FEF2575-%PRED-POST: 91 %
FEF2575-%Pred-Pre: 107 %
FEV1-%Change-Post: -1 %
FEV1-%Pred-Post: 70 %
FEV1-%Pred-Pre: 71 %
FEV1-POST: 1.99 L
FEV1-Pre: 2.02 L
FEV1FVC-%Change-Post: -3 %
FEV1FVC-%PRED-PRE: 112 %
FEV6-%Change-Post: 1 %
FEV6-%PRED-POST: 67 %
FEV6-%Pred-Pre: 67 %
FEV6-Post: 2.51 L
FEV6-Pre: 2.48 L
FEV6FVC-%Change-Post: 0 %
FEV6FVC-%PRED-PRE: 107 %
FEV6FVC-%Pred-Post: 106 %
FVC-%CHANGE-POST: 1 %
FVC-%PRED-POST: 63 %
FVC-%PRED-PRE: 62 %
FVC-PRE: 2.49 L
FVC-Post: 2.53 L
POST FEV1/FVC RATIO: 79 %
Post FEV6/FVC ratio: 99 %
Pre FEV1/FVC ratio: 81 %
Pre FEV6/FVC Ratio: 100 %
RV % pred: 118 %
RV: 3.12 L
TLC % pred: 82 %
TLC: 5.71 L

## 2014-10-03 MED ORDER — FLUTTER DEVI: Status: DC

## 2014-10-03 NOTE — Assessment & Plan Note (Signed)
-   08/17/2014 p extensive coaching HFA effectiveness =    90%  - 08/17/2014  Walked RA x 3 laps @ 185 ft each stopped due to  Thigh pain bilaterally, nl pace, min sob, no desats  - PFT's 10/03/2014 wnl including fef 25-75     This problem is as well controlled as it's going to be and the only remaining issue is the UACS (see sep a/p)

## 2014-10-03 NOTE — Patient Instructions (Addendum)
Add flutter valve and take it as much as you can  Continue qvar for now   For cough/ wheezing use the nebulizer up to every 4 hours as needed   For cough/congestion use mucinex dm up to 1200 mg every 12 hours as needed   Return to ENT asap as it is most likely the night time cough are due to nasal drainage from sinus CT   If not better after your ent sees you then return here with all medications in hand. Add needs hrct looking for bronchiectasis next

## 2014-10-03 NOTE — Assessment & Plan Note (Signed)
-   allergy profile 06/05/14 >>> IgE 2, neg RAST Eos neg - sinus ct  06/08/2014 Postsurgical changes as described. 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 Possible superimposed acute left maxillary sinus disease> rec augmentin x 10 days> temporarily improved  - augmentin x 20 days 07/25/2014 then sinus CT > 08/15/14 > There is mucosal thickening and likely fluid in both maxillary sinuses, more extensive on the right> 08/16/2014 rec  needs ent re-eval> done by DR Laurance Flatten 08/2014 rec nx rinses - flare 08/2014 > rec augmentin x 20 days 09/09/14 > referred back to ent   Given that his exam is so striking and his pfts are so nl he needs more attention to rx of sinus dz and if not fruitful needs hrct looking at issue of ? Bronchiectasis

## 2014-10-03 NOTE — Progress Notes (Signed)
Subjective:    Patient ID: Tim Morgan, male    DOB: 06-05-1933,    MRN: [id number]    Brief patient profile:  70 yowm never smoker with onset of coughing x around mid 90s gradually worse with prev dx of copd by ? [place] ? Some better with qvar not better with prednisone and assoc with  rhinitis x 2010 and variable short or breath when coughing especially since Oct 2015 referred by [name] to pulmonary clinic 05/09/14 with dx of mild chronic asthma ? Related to chronic sinusitis +/- GERD with nl pfts despite active "wheezing"  10/03/2014    History of Present Illness  05/19/2014 1st [facility] office visit/ [name]   Chief Complaint  Patient presents with  . Pulmonary Consult    Referred by Dr. [name] for eval of PF. Pt c/o cough, SOB and congestion for the past 20 yrs- worse x 2 months.  He states that he gets out of breath walking approx 100 yards.  His cough is prod with large amounts of grey/green spuutm.   just finished levaquin and avelox and some better  Better also  p neb  Cough is more day than night assoc with sense of pnds/ remote sinus surgery  Temp and dust changes make the cough worse  Laughing makes it worse  Does fine once asleep but lots cough at hs  Increase the qvar to 80 Take 2 puffs first thing in am and then another 2 puffs about 12 hours later.  Pantoprazole (protonix) 40 mg   Take 30-60 min before first meal of the day and Pepcid 20 mg one bedtime   GERD diet  Augmentin 875 mg take one pill twice daily  X 10 days  mucinex dm up to 1200 mg every 12 hours for cough    06/06/2014 f/u ov/[name] re: chronic cough x 20 years Chief Complaint  Patient presents with  . Follow-up    Pt states cough and SOB are some better since the last visit. No new co's today.   Not limited by breathing from desired activities   Cough improved but still present with using voice/laughing/ mostly dry now/ on qvar 40 4 bid  rec Nexium should be 30 min before bfast  and continue pepcid 20 mg at bedtime but stop protonix  Qvar 80 Take 2 puffs first thing in am and then another 2 puffs about 12 hours later. Please see patient coordinator before you leave today  to schedule  Sinus CT > L sinusitis > rx x 10 days augmentin   For drainage take chlortrimeton (chlorpheniramine) 4 mg every 4 hours available over the counter (may cause drowsiness)    07/25/2014 f/u ov/[name] re: cough x 20 years  Chief Complaint  Patient presents with  . Follow-up    Pt states that his breathing may be slightly improved since his last visit. No new co's today.   much better while on augmentin  Worse with laughing or with temp changes Coughing takes his breath away but also doe even if walk across [place] with prns  rec Augmentin 875 mg take one pill twice daily  X 20  days  .  Please see patient coordinator before you leave today  to schedule sinus CT in 3 weeks > still sinusitis > 20 ,more days augmentin Take either the pantoprazole or nexium before bfast daily but not both  For drippy nose, drainage For drainage take chlortrimeton (chlorpheniramine) 4 mg up to  every 4 hours available over the counter (may cause drowsiness)  For cough/congestion > mucinex dm up to 1200 mg every 12 hours as needed    08/17/2014 f/u ov/[name] re: 40 y cough/ sob / acute on chronic sinusitis    Chief Complaint  Patient presents with  . Follow-up    Pt states that his cough has improved since his last visit. No new co's today.   On qvar 80 2bid no need for neb saba  at all  Breathing better > able to walk [employer]/ causes some coughing/ can't do whole store s stopping sev times  Some noct cough no longer on pepcic at hs  rec See your ENT doctor Laurance Flatten) but you will need to pick up your sinus CT before you go and let him know we treated you with 3 weeks of Augmentin before the second CT and it didn't work  very effectively > rec sinus rinses  Add back pepcid 20 mg one at  bedtime   09/09/2014 f/u ov/[name] re: flare of cough  Chief Complaint  Patient presents with  . Acute Visit    Pt has had cough with grey mucus, head and chest congestion,runny nose and sneezing.Pt has severe fatigue  started gradually  worse cough/ nasal and chest congestion a few days  after last ov despite pepcid at hs  And sinus rinses from ent  Has not restarted the neb as doesn't think he needs it  rec Augmentin 875 mg take one pill twice daily  X 20 days - take at breakfast and supper with large glass of water.  It would help reduce the usual side effects (diarrhea and yeast infections) if you ate cultured yogurt at lunch.  Stop hydrochlorthiazide For breathing > use nebulizer up to every 4 hours as needed  For cough > mucinex dm 1200 every 12 hours and supplement with tramadol 50 mg one every 4 hours      10/03/2014 f/u ov/[name] re: cough x 20 year p augmentin x 20 days complete in 2 days  Chief Complaint  Patient presents with  . Follow-up    PFT was done today.  Pt c/o increased wheezing since his last visit. He is coughing less.    cough with laughing or at hs   Breathing ok walking including around [employer] but legs limiting him now  Excellent hfa / on qvar 80 2bid maint / neb only"stirs things up" minimal improvement p neb rx    No obvious day to day or daytime variabilty or assoc    cp or chest tightness, subjective wheeze or overt  hb symptoms. No unusual exp hx or h/o childhood pna/ asthma or knowledge of premature birth.  Sleeping ok without nocturnal  or early am exacerbation  of respiratory  c/o's or need for noct saba. Also denies any obvious fluctuation of symptoms with weather or environmental changes or other aggravating or alleviating factors except as outlined above   Current Medications, Allergies, Complete Past Medical History, Past Surgical History, Family History, and Social History were reviewed in [employer] record.  ROS  The  following are not active complaints unless bolded sore throat, dysphagia, dental problems, itching, sneezing,  nasal congestion or excess/ purulent secretions, ear ache,   fever, chills, sweats, unintended wt loss, pleuritic or exertional cp, hemoptysis,  orthopnea pnd or leg swelling, presyncope, palpitations, heartburn, abdominal pain, anorexia, nausea, vomiting, diarrhea  or change in bowel or urinary habits, change in stools or urine,  dysuria,hematuria,  rash, arthralgias, visual complaints, headache, numbness weakness or ataxia or problems with walking or coordination,  change in mood/affect or memory.                             Objective:   Physical Exam   amb wm nad with nasal tone to voice   / congested sounding voice   06/06/2014       220 > 07/25/2014   217 > 08/17/2014 215 >  10/03/2014 217   Wt Readings from Last 3 Encounters:  05/19/14 221 lb (100.245 kg)  05/19/13 235 lb (106.595 kg)  11/16/12 263 lb (119.296 kg)    Vital signs reviewed   HEENT: nl dentition, turbinates, and orophanx. Nl external ear canals without cough reflex   NECK :  without JVD/Nodes/TM/ nl carotid upstrokes bilaterally   LUNGS: no acc muscle use,    CV:  RRR  no s3 or murmur or increase in P2, no edema   ABD:  soft and nontender with nl excursion in the supine position. No bruits or organomegaly, bowel sounds nl  MS:  warm without deformities, calf tenderness, cyanosis or clubbing  SKIN: warm and dry without lesions    NEURO:  alert, approp, no deficits     CXR PA and Lateral:   09/09/2014 :     I personally reviewed images and agree with radiology impression as follows:   Mild cardiac enlargement. No pleural effusion or edema. Both lungs are clear. Spondylosis noted within the thoracic spine          Assessment & Plan:   Outpatient Encounter Prescriptions as of 10/03/2014  Medication Sig  . amLODipine-olmesartan (AZOR) 5-40 MG per tablet Take 1 tablet by mouth every  morning.   Marland Kitchen amoxicillin-clavulanate (AUGMENTIN) 875-125 MG per tablet Augmentin 875 mg take one pill twice daily    take at breakfast and supper with large glass of water.  . beclomethasone (QVAR) 80 MCG/ACT inhaler Inhale 2 puffs into the lungs 2 (two) times daily.  . calcium citrate-vitamin D (CITRACAL+D) 315-200 MG-UNIT per tablet Take 1 tablet by mouth 2 (two) times daily.  . chlorpheniramine (CHLOR-TRIMETON) 4 MG tablet Take 8 mg by mouth daily.  . Chromium-Cinnamon (CINNAMON PLUS CHROMIUM PO) Take 2-3 capsules by mouth 2 (two) times daily.  . CRESTOR 20 MG tablet Take 40 mg by mouth at bedtime.  Marland Kitchen Dextromethorphan-Guaifenesin (MUCINEX DM MAXIMUM STRENGTH PO) Take 1 tablet by mouth 2 (two) times daily.  Marland Kitchen esomeprazole (NEXIUM) 40 MG capsule Take 40 mg by mouth daily.    . famotidine (PEPCID) 20 MG tablet Take 20 mg by mouth at bedtime.  . fluticasone (FLONASE) 50 MCG/ACT nasal spray Place 2 sprays into both nostrils daily.  . hydrOXYzine (ATARAX/VISTARIL) 25 MG tablet Take 25 mg by mouth 3 (three) times daily as needed.  Marland Kitchen ipratropium-albuterol (DUONEB) 0.5-2.5 (3) MG/3ML SOLN 1 vial in nebulizer daily as needed  . magnesium oxide (MAG-OX) 400 MG tablet Take 400 mg by mouth daily.  . metFORMIN (GLUMETZA) 500 MG (MOD) 24 hr tablet Take 500 mg by mouth 2 (two) times daily with a meal.  . methocarbamol (ROBAXIN) 500 MG tablet Take 500 mg by mouth 2 (two) times daily.   . Multiple Vitamin (MULTIVITAMIN WITH MINERALS) TABS Take 1 tablet by mouth daily.  . Multiple Vitamins-Minerals (PRESERVISION AREDS) TABS Take 1 tablet by mouth daily.  . Potassium 99 MG TABS Take 99  mg by mouth daily.  . sildenafil (VIAGRA) 100 MG tablet Take 100 mg by mouth daily as needed for erectile dysfunction.  . tamsulosin (FLOMAX) 0.4 MG CAPS capsule Take 0.4 mg by mouth daily.  . traMADol (ULTRAM) 50 MG tablet 1-2 every 4 hours as needed for cough or pain  . vancomycin 1,000 mg in sodium chloride 0.9 % 1,000 mL  Irrigate with 1 application as directed 2 (two) times daily.  Marland Kitchen ZETIA 10 MG tablet Take 10 mg by mouth daily.  Marland Kitchen Respiratory Therapy Supplies (FLUTTER) DEVI Use as directed  . [DISCONTINUED] pravastatin (PRAVACHOL) 40 MG tablet Take 40 mg by mouth at bedtime.

## 2014-10-03 NOTE — Assessment & Plan Note (Signed)
C/w  Classic Upper airway cough syndrome, so named because it's frequently impossible to sort out how much is  CR/sinusitis with freq throat clearing (which can be related to primary GERD)   vs  causing  secondary (" extra esophageal")  GERD from wide swings in gastric pressure that occur with throat clearing, often  promoting self use of mint and menthol lozenges that reduce the lower esophageal sphincter tone and exacerbate the problem further in a cyclical fashion.   These are the same pts (now being labeled as having "irritable larynx syndrome" by some cough centers) who not infrequently have a history of having failed to tolerate ace inhibitors,  dry powder inhalers or biphosphonates or report having atypical reflux symptoms that don't respond to standard doses of PPI , and are easily confused as having aecopd or asthma flares by even experienced allergists/ pulmonologists.   For now add flutter valve and focus on sinus dz

## 2014-10-03 NOTE — Progress Notes (Signed)
PFT done today. 

## 2015-04-03 ENCOUNTER — Ambulatory Visit (INDEPENDENT_AMBULATORY_CARE_PROVIDER_SITE_OTHER): Payer: Medicare Other | Admitting: Ophthalmology

## 2015-04-03 DIAGNOSIS — E11319 Type 2 diabetes mellitus with unspecified diabetic retinopathy without macular edema: Secondary | ICD-10-CM

## 2015-04-03 DIAGNOSIS — E113293 Type 2 diabetes mellitus with mild nonproliferative diabetic retinopathy without macular edema, bilateral: Secondary | ICD-10-CM

## 2015-04-03 DIAGNOSIS — H43813 Vitreous degeneration, bilateral: Secondary | ICD-10-CM | POA: Diagnosis not present

## 2015-04-03 DIAGNOSIS — H353121 Nonexudative age-related macular degeneration, left eye, early dry stage: Secondary | ICD-10-CM

## 2015-04-03 DIAGNOSIS — H35033 Hypertensive retinopathy, bilateral: Secondary | ICD-10-CM

## 2015-04-03 DIAGNOSIS — I10 Essential (primary) hypertension: Secondary | ICD-10-CM

## 2015-04-03 DIAGNOSIS — H2513 Age-related nuclear cataract, bilateral: Secondary | ICD-10-CM

## 2015-10-18 ENCOUNTER — Emergency Department (HOSPITAL_COMMUNITY)
Admission: EM | Admit: 2015-10-18 | Discharge: 2015-10-18 | Disposition: A | Discharge status: Home / Self Care | Payer: No Typology Code available for payment source | Attending: Emergency Medicine | Admitting: Emergency Medicine

## 2015-10-18 ENCOUNTER — Encounter (HOSPITAL_COMMUNITY): Payer: Self-pay | Admitting: Emergency Medicine

## 2015-10-18 ENCOUNTER — Emergency Department (HOSPITAL_COMMUNITY): Payer: No Typology Code available for payment source

## 2015-10-18 DIAGNOSIS — Z7951 Long term (current) use of inhaled steroids: Secondary | ICD-10-CM | POA: Insufficient documentation

## 2015-10-18 DIAGNOSIS — K219 Gastro-esophageal reflux disease without esophagitis: Secondary | ICD-10-CM | POA: Insufficient documentation

## 2015-10-18 DIAGNOSIS — Z7984 Long term (current) use of oral hypoglycemic drugs: Secondary | ICD-10-CM | POA: Insufficient documentation

## 2015-10-18 DIAGNOSIS — J449 Chronic obstructive pulmonary disease, unspecified: Secondary | ICD-10-CM | POA: Insufficient documentation

## 2015-10-18 DIAGNOSIS — Y998 Other external cause status: Secondary | ICD-10-CM | POA: Insufficient documentation

## 2015-10-18 DIAGNOSIS — E559 Vitamin D deficiency, unspecified: Secondary | ICD-10-CM | POA: Diagnosis not present

## 2015-10-18 DIAGNOSIS — I10 Essential (primary) hypertension: Secondary | ICD-10-CM | POA: Insufficient documentation

## 2015-10-18 DIAGNOSIS — Y9389 Activity, other specified: Secondary | ICD-10-CM | POA: Insufficient documentation

## 2015-10-18 DIAGNOSIS — S199XXA Unspecified injury of neck, initial encounter: Secondary | ICD-10-CM | POA: Insufficient documentation

## 2015-10-18 DIAGNOSIS — S0990XA Unspecified injury of head, initial encounter: Secondary | ICD-10-CM | POA: Insufficient documentation

## 2015-10-18 DIAGNOSIS — Z79899 Other long term (current) drug therapy: Secondary | ICD-10-CM | POA: Diagnosis not present

## 2015-10-18 DIAGNOSIS — E119 Type 2 diabetes mellitus without complications: Secondary | ICD-10-CM | POA: Insufficient documentation

## 2015-10-18 DIAGNOSIS — Y92410 Unspecified street and highway as the place of occurrence of the external cause: Secondary | ICD-10-CM | POA: Diagnosis not present

## 2015-10-18 DIAGNOSIS — T148XXA Other injury of unspecified body region, initial encounter: Secondary | ICD-10-CM

## 2015-10-18 LAB — CBG MONITORING, ED: GLUCOSE-CAPILLARY: 125 mg/dL — AB (ref 65–99)
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
## 2015-10-18 NOTE — ED Notes (Signed)
CBG was 125, RN was notified

## 2015-10-18 NOTE — Discharge Instructions (Signed)
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
## 2015-11-06 ENCOUNTER — Ambulatory Visit: Payer: Medicare Other | Admitting: Physical Therapy

## 2016-03-30 IMAGING — CR DG CHEST 2V
2 series · 2 of 2 positions shown · non-contrast
Comparison: PA and lateral chest of November 18, 2012

CLINICAL DATA: Cough and congestion and shortness of breath with
chest pain for 2 months; history of COPD and diabetes

EXAM:
CHEST  2 VIEW

[view not recorded (1 of 2)]
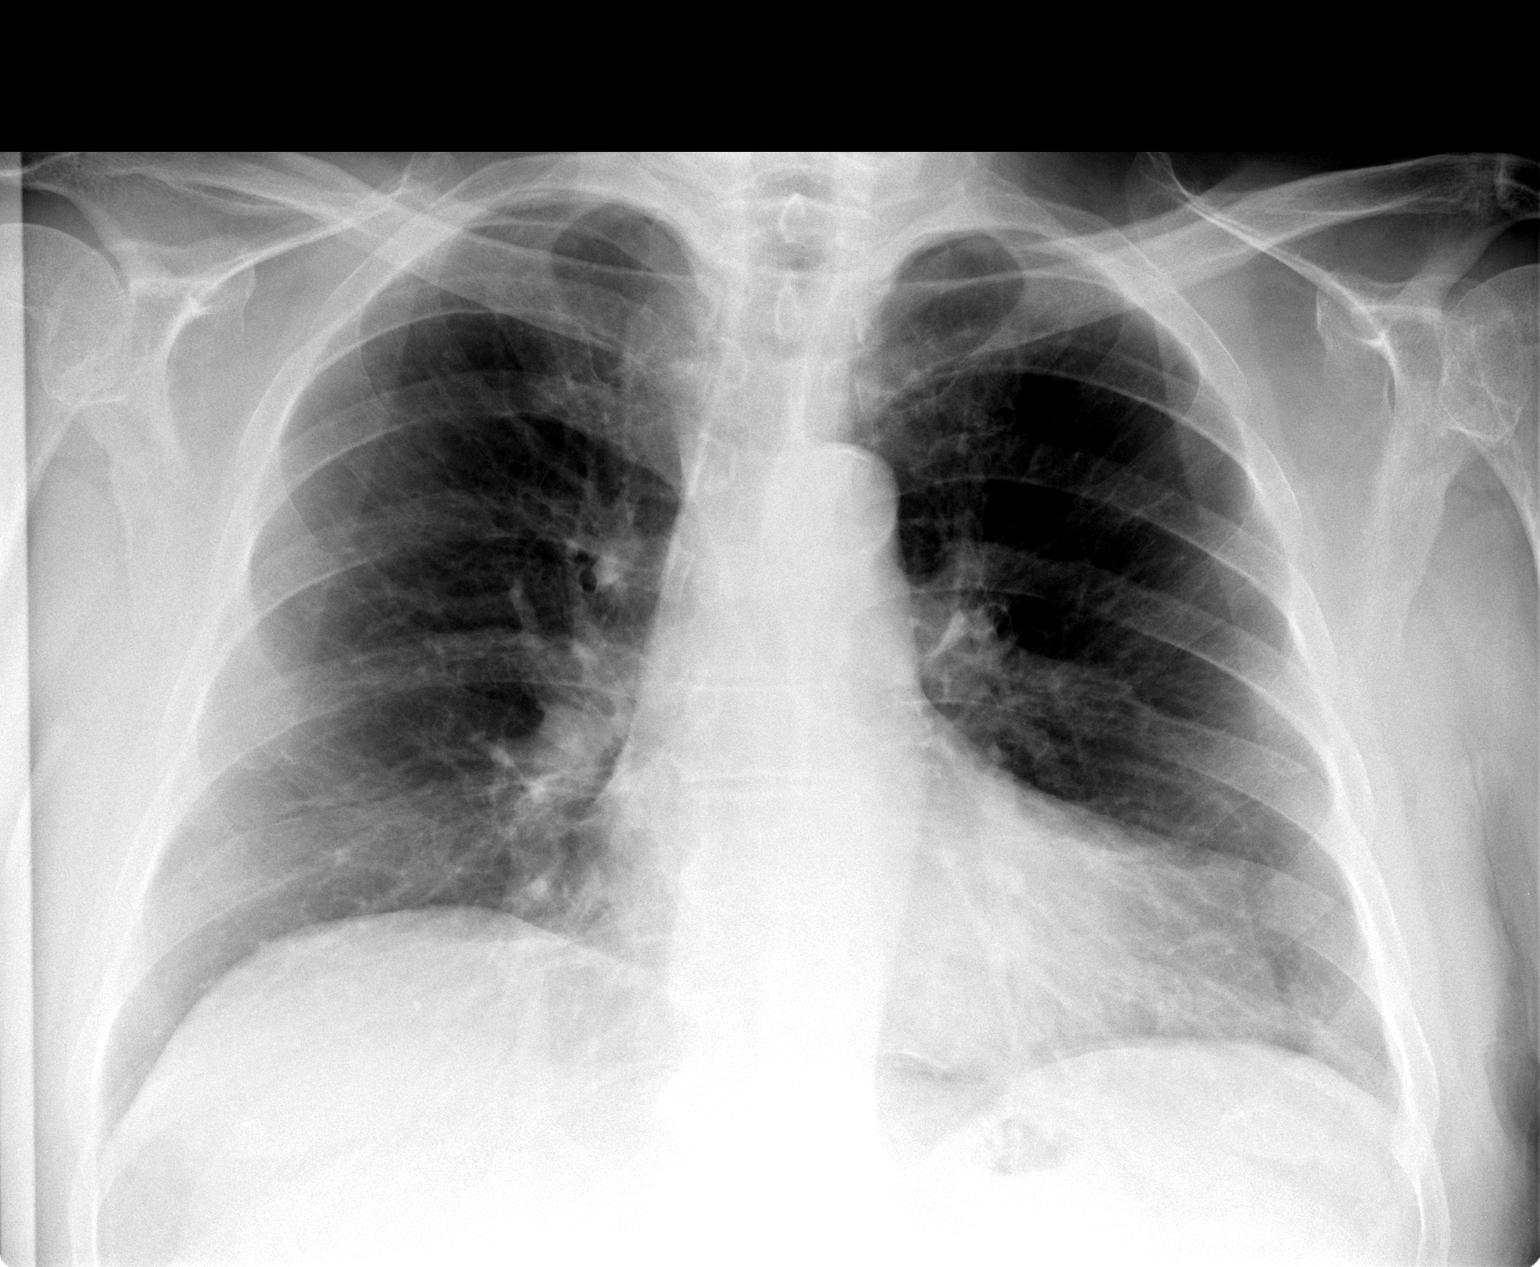

[view not recorded (2 of 2)]
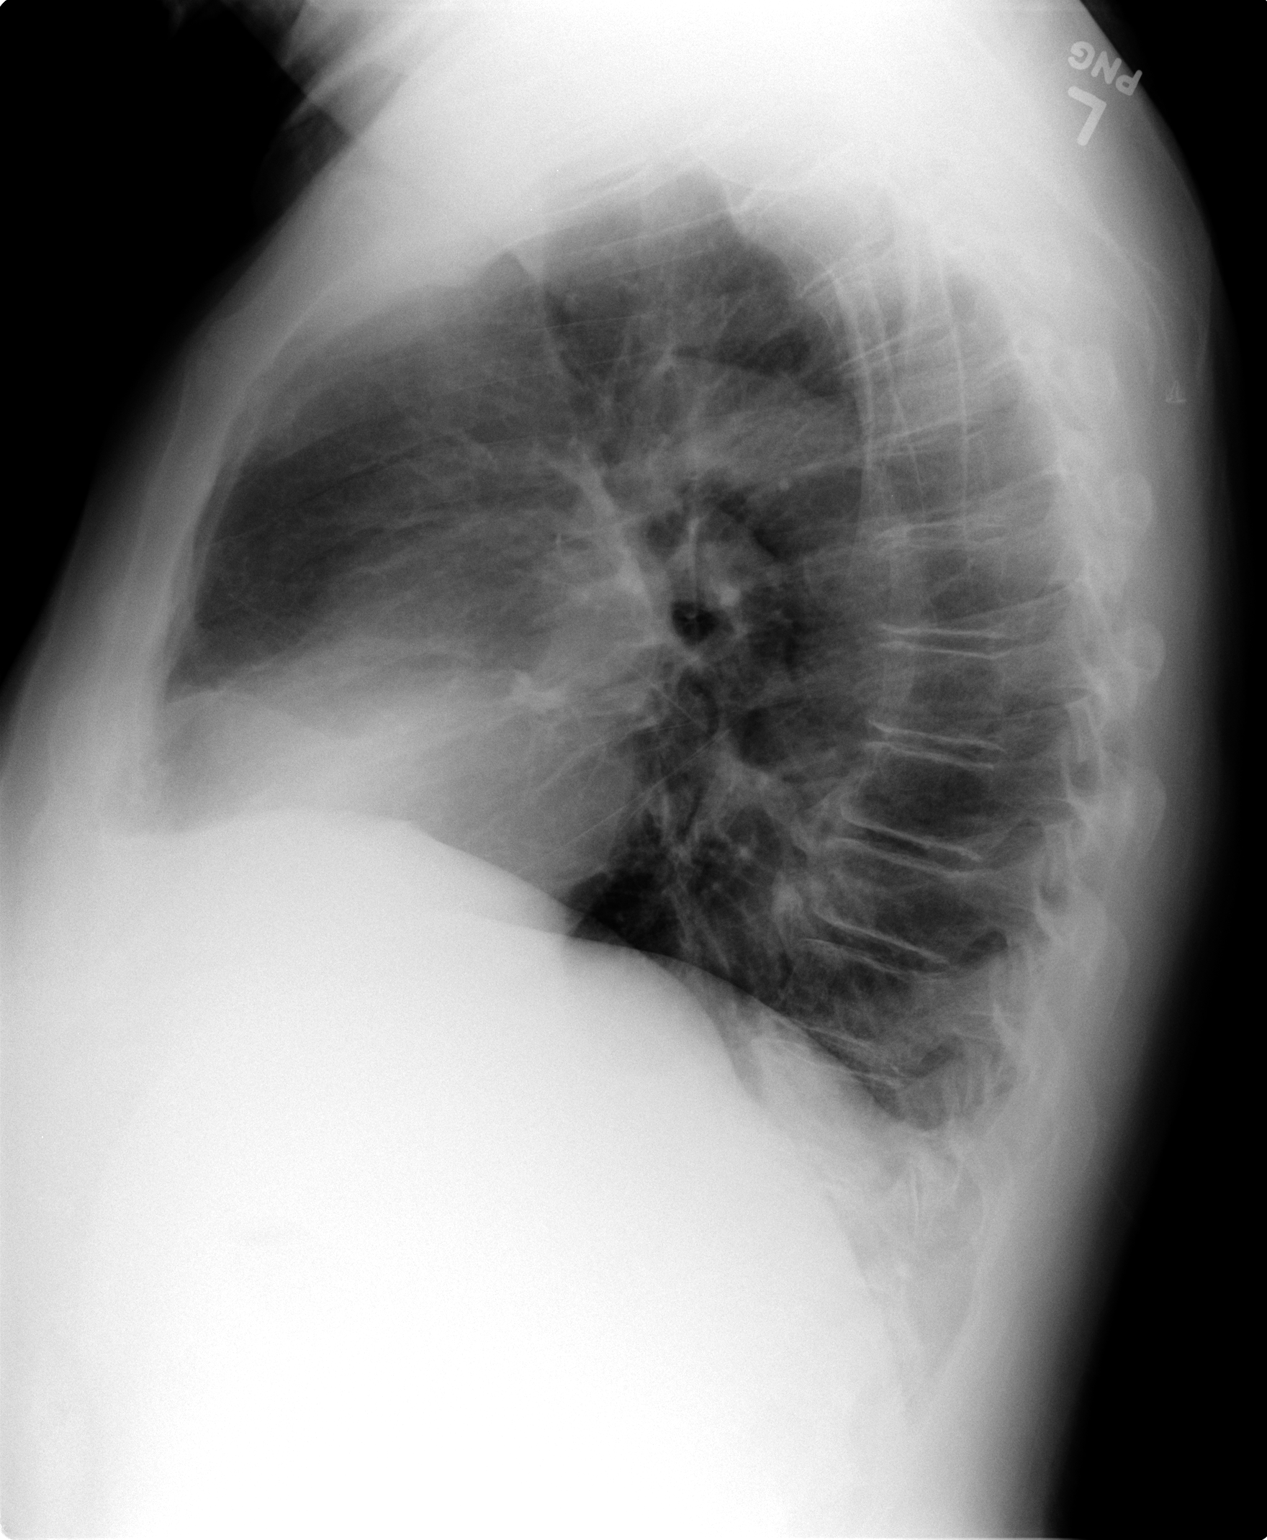

[2 of 2 positions shown; findings below may reference images not displayed]

FINDINGS: The lungs are adequately inflated. There is no focal infiltrate. The
heart is mildly enlarged but stable. The pulmonary vascularity is
not engorged. There is no pleural effusion. The bony thorax is
unremarkable.
IMPRESSION: There remains borderline enlargement of the cardiac silhouette.
There is no evidence of CHF, pneumonia, nor other acute
cardiopulmonary abnormality.

## 2016-04-04 ENCOUNTER — Ambulatory Visit (INDEPENDENT_AMBULATORY_CARE_PROVIDER_SITE_OTHER): Payer: Medicare Other | Admitting: Ophthalmology

## 2016-04-04 DIAGNOSIS — H43813 Vitreous degeneration, bilateral: Secondary | ICD-10-CM | POA: Diagnosis not present

## 2016-04-04 DIAGNOSIS — E113293 Type 2 diabetes mellitus with mild nonproliferative diabetic retinopathy without macular edema, bilateral: Secondary | ICD-10-CM | POA: Diagnosis not present

## 2016-04-04 DIAGNOSIS — I10 Essential (primary) hypertension: Secondary | ICD-10-CM

## 2016-04-04 DIAGNOSIS — H353121 Nonexudative age-related macular degeneration, left eye, early dry stage: Secondary | ICD-10-CM | POA: Diagnosis not present

## 2016-04-04 DIAGNOSIS — H35033 Hypertensive retinopathy, bilateral: Secondary | ICD-10-CM

## 2016-04-04 DIAGNOSIS — E11319 Type 2 diabetes mellitus with unspecified diabetic retinopathy without macular edema: Secondary | ICD-10-CM

## 2016-05-31 ENCOUNTER — Encounter (INDEPENDENT_AMBULATORY_CARE_PROVIDER_SITE_OTHER): Payer: Medicare Other | Admitting: Ophthalmology

## 2016-05-31 DIAGNOSIS — E11319 Type 2 diabetes mellitus with unspecified diabetic retinopathy without macular edema: Secondary | ICD-10-CM

## 2016-05-31 DIAGNOSIS — I10 Essential (primary) hypertension: Secondary | ICD-10-CM

## 2016-05-31 DIAGNOSIS — H43813 Vitreous degeneration, bilateral: Secondary | ICD-10-CM | POA: Diagnosis not present

## 2016-05-31 DIAGNOSIS — H2513 Age-related nuclear cataract, bilateral: Secondary | ICD-10-CM | POA: Diagnosis not present

## 2016-05-31 DIAGNOSIS — H35033 Hypertensive retinopathy, bilateral: Secondary | ICD-10-CM

## 2016-05-31 DIAGNOSIS — E113293 Type 2 diabetes mellitus with mild nonproliferative diabetic retinopathy without macular edema, bilateral: Secondary | ICD-10-CM | POA: Diagnosis not present

## 2016-09-27 ENCOUNTER — Emergency Department (HOSPITAL_BASED_OUTPATIENT_CLINIC_OR_DEPARTMENT_OTHER)
Admission: EM | Admit: 2016-09-27 | Discharge: 2016-09-27 | Disposition: A | Discharge status: Home / Self Care | Payer: Medicare Other | Attending: Emergency Medicine | Admitting: Emergency Medicine

## 2016-09-27 ENCOUNTER — Encounter (HOSPITAL_BASED_OUTPATIENT_CLINIC_OR_DEPARTMENT_OTHER): Payer: Self-pay

## 2016-09-27 DIAGNOSIS — S0001XA Abrasion of scalp, initial encounter: Secondary | ICD-10-CM | POA: Insufficient documentation

## 2016-09-27 DIAGNOSIS — Z23 Encounter for immunization: Secondary | ICD-10-CM | POA: Diagnosis not present

## 2016-09-27 DIAGNOSIS — Z794 Long term (current) use of insulin: Secondary | ICD-10-CM | POA: Insufficient documentation

## 2016-09-27 DIAGNOSIS — S0990XA Unspecified injury of head, initial encounter: Secondary | ICD-10-CM | POA: Diagnosis present

## 2016-09-27 DIAGNOSIS — J449 Chronic obstructive pulmonary disease, unspecified: Secondary | ICD-10-CM | POA: Diagnosis not present

## 2016-09-27 DIAGNOSIS — Y9259 Other trade areas as the place of occurrence of the external cause: Secondary | ICD-10-CM | POA: Diagnosis not present

## 2016-09-27 DIAGNOSIS — I10 Essential (primary) hypertension: Secondary | ICD-10-CM | POA: Diagnosis not present

## 2016-09-27 DIAGNOSIS — W228XXA Striking against or struck by other objects, initial encounter: Secondary | ICD-10-CM | POA: Diagnosis not present

## 2016-09-27 DIAGNOSIS — Y9389 Activity, other specified: Secondary | ICD-10-CM | POA: Insufficient documentation

## 2016-09-27 DIAGNOSIS — Z79899 Other long term (current) drug therapy: Secondary | ICD-10-CM | POA: Insufficient documentation

## 2016-09-27 DIAGNOSIS — E119 Type 2 diabetes mellitus without complications: Secondary | ICD-10-CM | POA: Insufficient documentation

## 2016-09-27 DIAGNOSIS — Y999 Unspecified external cause status: Secondary | ICD-10-CM | POA: Insufficient documentation

## 2016-09-27 MED ORDER — TETANUS-DIPHTH-ACELL PERTUSSIS 5-2.5-18.5 LF-MCG/0.5 IM SUSP
0.5000 mL | Freq: Once | INTRAMUSCULAR | Status: AC
  Administered 2016-09-27: 0.5 mL via INTRAMUSCULAR
  Filled 2016-09-27: qty 0.5
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
## 2016-09-27 NOTE — ED Provider Notes (Signed)
[facility] DEPT [facility] Provider Note   CSN: [id number] Arrival date & time: 09/27/16  1606   By signing my name below, I, Tim Morgan, attest that this documentation has been prepared under the direction and in the presence of [name], PA-C Electronically Signed: Soijett Morgan, ED Scribe. 09/27/16. 4:38 PM.  History   Chief Complaint Chief Complaint  Patient presents with  . Head Injury    HPI Tim Morgan is a 83 y.o. male with a PMHx of DM, HTN, who presents to the Emergency Department complaining of head injury occurring 1.5 hours ago. Pt reports associated abrasion to top of head. Pt has not tried any medications for the relief of his symptoms. He notes that he was bending down to enter the shopping cart only entry at [facility] when he struck the top of his head on the metal frame. Pt is unsure of the status of his tetanus vaccination at this time. Denies HA, vision change, LOC, color change, and any other symptoms. Denies taking anticoagulant medications. Patient states he was able to do his grocery shopping after head injury, denies associated symptoms. Requesting tetanus shot.  The history is provided by the patient and the spouse. No language interpreter was used.    Past Medical History:  Diagnosis Date  . Acute bronchitis   . COPD (chronic obstructive pulmonary disease) ([place])   . Diabetes mellitus   . Edema of left lower extremity 07-03-2012   venous doppler-no evidence of thrombus or thrombophlebitis,left gsv valvular insufficency throughout the vein,left SSV appeared patent wiyh no venous insuffiency, left popliteal area questionable rupture Baker's cyst this was considered a abnormal doppler  . GERD (gastroesophageal reflux disease)   . History of back injury 1990   L 4 to , no surgery healed on own  . Hypertension 12-06-2011   echo-EF 65-70%   . Left ventricular hypertrophy   . Vitamin D deficiency     Patient Active Problem List   Diagnosis Date Noted  .  Essential hypertension 09/10/2014  . Upper airway cough syndrome 09/10/2014  . Mild persistent asthma in adult without complication 14/97/0263  . Chronic sinusitis/chronic cough  05/19/2014  . Acute medial meniscal tear 11/18/2012    Past Surgical History:  Procedure Laterality Date  . ANKLE SURGERY Left 1990  . APPENDECTOMY  age 73  . [place]   concusion  . HERNIA REPAIR     x 2  . KNEE ARTHROSCOPY Left 11/18/2012   Procedure: LEFT ARTHROSCOPY KNEE WITH DEBRIDEMENT AND CHRONDROLPLASTY;  Surgeon: Gearlean Alf, MD;  Location: [facility] ORS;  Service: Orthopedics;  Laterality: Left;  . NASAL SINUS SURGERY Bilateral    to open up ear tubes       Home Medications    Prior to Admission medications   Medication Sig Start Date End Date Taking? Authorizing Provider  amLODipine-olmesartan (AZOR) 5-40 MG per tablet Take 1 tablet by mouth every morning.    Yes Historical Provider, MD  beclomethasone (QVAR) 80 MCG/ACT inhaler Inhale 2 puffs into the lungs 2 (two) times daily.   Yes Historical Provider, MD  calcium citrate-vitamin D (CITRACAL+D) 315-200 MG-UNIT per tablet Take 1 tablet by mouth 2 (two) times daily.   Yes Historical Provider, MD  Chromium-Cinnamon (CINNAMON PLUS CHROMIUM PO) Take 2-3 capsules by mouth 2 (two) times daily.   Yes Historical Provider, MD  Coenzyme Q10 (COQ10) 100 MG CAPS Take 1 capsule by mouth daily.   Yes Historical Provider, MD  Dextromethorphan-Guaifenesin (  MUCINEX DM MAXIMUM STRENGTH PO) Take 1 tablet by mouth daily as needed. For congestion   Yes Historical Provider, MD  fluticasone (FLONASE) 50 MCG/ACT nasal spray Place 2 sprays into both nostrils daily.   Yes Historical Provider, MD  furosemide (LASIX) 20 MG tablet Take 20 mg by mouth.   Yes Historical Provider, MD  hydrOXYzine (ATARAX/VISTARIL) 25 MG tablet Take 25 mg by mouth 3 (three) times daily as needed. 06/13/14  Yes Historical Provider, MD  magnesium oxide (MAG-OX) 400 MG tablet Take 400 mg  by mouth daily.   Yes Historical Provider, MD  metFORMIN (GLUMETZA) 500 MG (MOD) 24 hr tablet Take 500 mg by mouth 2 (two) times daily with a meal.   Yes Historical Provider, MD  methocarbamol (ROBAXIN) 500 MG tablet Take 500 mg by mouth 2 (two) times daily.    Yes Historical Provider, MD  Multiple Vitamin (MULTIVITAMIN WITH MINERALS) TABS Take 1 tablet by mouth daily.   Yes Historical Provider, MD  Multiple Vitamins-Minerals (PRESERVISION AREDS) TABS Take 1 tablet by mouth daily.   Yes Historical Provider, MD  omeprazole (PRILOSEC) 20 MG capsule Take 20 mg by mouth daily.   Yes Historical Provider, MD  Potassium 99 MG TABS Take 99 mg by mouth daily.   Yes Historical Provider, MD  rosuvastatin (CRESTOR) 40 MG tablet Take 40 mg by mouth at bedtime.   Yes Historical Provider, MD  tamsulosin (FLOMAX) 0.4 MG CAPS capsule Take 0.4 mg by mouth daily.   Yes Historical Provider, MD  terbinafine (LAMISIL) 250 MG tablet Take 250 mg by mouth daily.   Yes Historical Provider, MD  triamcinolone cream (KENALOG) 0.1 % Apply 1 application topically daily as needed. For rash/ itching   Yes Historical Provider, MD  amoxicillin-clavulanate (AUGMENTIN) 875-125 MG per tablet Augmentin 875 mg take one pill twice daily    take at breakfast and supper with large glass of water. Patient not taking: Reported on 10/18/2015 09/09/14   Tanda Rockers, MD  budesonide-formoterol [facility]) 160-4.5 MCG/ACT inhaler Inhale 2 puffs into the lungs 2 (two) times daily.    Historical Provider, MD  docusate sodium (COLACE) 100 MG capsule Take 100 mg by mouth at bedtime.    Historical Provider, MD  esomeprazole (NEXIUM) 40 MG capsule Take 40 mg by mouth daily.      Historical Provider, MD  famotidine (PEPCID) 20 MG tablet Take 20 mg by mouth at bedtime. Reported on 10/18/2015 07/09/14   Historical Provider, MD  insulin aspart (NOVOLOG) 100 UNIT/ML FlexPen Inject 6 Units into the skin 3 (three) times daily with meals.    Historical Provider,  MD  insulin glargine (LANTUS) 100 UNIT/ML injection Inject 18 Units into the skin at bedtime.    Historical Provider, MD  ipratropium-albuterol (DUONEB) 0.5-2.5 (3) MG/3ML SOLN Reported on 10/18/2015 05/03/14   Historical Provider, MD  Respiratory Therapy Supplies (FLUTTER) DEVI Use as directed 10/03/14   Tanda Rockers, MD  sildenafil (VIAGRA) 100 MG tablet Take 100 mg by mouth daily as needed for erectile dysfunction.    Historical Provider, MD  traMADol (ULTRAM) 50 MG tablet 1-2 every 4 hours as needed for cough or pain Patient not taking: Reported on 10/18/2015 09/09/14   Tanda Rockers, MD    Family History No family history on file.  Social History Social History  Substance Use Topics  . Smoking status: Never Smoker  . Smokeless tobacco: Never Used  . Alcohol use No     Allergies   Patient  has no known allergies.   Review of Systems Review of Systems  Eyes: Negative for visual disturbance.  Skin: Positive for wound (abrasion to top of head). Negative for color change.  Neurological: Negative for syncope and headaches.     Physical Exam Updated Vital Signs BP 124/67 (BP Location: Left Arm)   Pulse 73   Temp 98.4 F (36.9 C) (Oral)   Resp 18   Ht 5\' 10"  (1.778 m)   Wt 109.8 kg   SpO2 95%   BMI 34.72 kg/m   Physical Exam  Constitutional: He is oriented to person, place, and time. He appears well-developed and well-nourished. No distress.  HENT:  Head: Normocephalic. Head is with abrasion.  Right Ear: External ear normal.  Left Ear: External ear normal.  Nose: Nose normal.  Mouth/Throat: Oropharynx is clear and moist. No oropharyngeal exudate.  3 cm abrasion to top of scalp without bleeding, very surrounding mild tenderness No Raccoon's eyes. No Battle's sign. No hemotympanum, bilaterally  Eyes: Conjunctivae and EOM are normal. Pupils are equal, round, and reactive to light. No scleral icterus.  Lids normal EOMs and PERRL intact bilaterally  Neck: Normal range  of motion. Neck supple. No JVD present. No tracheal deviation present.  Cardiovascular: Normal rate, regular rhythm, normal heart sounds and intact distal pulses.   No murmur heard. Pulmonary/Chest: Effort normal and breath sounds normal. He has no wheezes.  Abdominal: Soft. He exhibits no distension. There is no tenderness.  Musculoskeletal: Normal range of motion. He exhibits no deformity.  Lymphadenopathy:    He has no cervical adenopathy.  Neurological: He is alert and oriented to person, place, and time.  Reflex Scores:      Patellar reflexes are 2+ on the right side and 2+ on the left side. Pt is alert and oriented.   Speech and phonation normal.   Thought process coherent.   Strength 5/5 in upper and lower extremities.   Sensation to light touch intact in upper and lower extremities.  Gait normal.   Negative Romberg. No leg drift.  Intact finger to nose test. CN I not tested CN II full visual fields  CN III, IV, VI PEERL and EOM intact CN V light touch intact in all 3 divisions of trigeminal nerve CN VII facial nerve movements intact, symmetric CN VIII hearing intact to finger rub CN IX, X no uvula deviation, symmetric soft palate rise CN XI 5/5 SCM and trapezius strength  CN XII Tongue midline with symmetric L/R movement  Skin: Skin is warm and dry. Capillary refill takes less than 2 seconds.  Psychiatric: He has a normal mood and affect. His behavior is normal. Judgment and thought content normal.  Nursing note and vitals reviewed.    ED Treatments / Results  DIAGNOSTIC STUDIES: Oxygen Saturation is 95% on RA, adequate by my interpretation.    COORDINATION OF CARE: 4:35 PM Discussed treatment plan with pt at bedside which includes update tetanus vaccination, wound care, and pt agreed to plan.   Procedures Procedures (including critical care time)  Medications Ordered in ED Medications  Tdap (BOOSTRIX) injection 0.5 mL (not administered)     Initial Impression  / Assessment and Plan / ED Course  I have reviewed the triage vital signs and the nursing notes.  Small abrasion to the scalp after low risk, mechanical  injury. No anticoagulants. Patient has no other associated complaints. Thorough neurological exam is normal. Tdap updated.  Abrasion cleaned by RN.  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 Last CT scan head October 2017 showed stable, unchanged subdural hematomas. I think patient is safe for discharge at this time.  Head CT is not indicated at this time, I doubt intracranial injury or bleed given low risk injury, no neurological deficits. Patient tells me he feels well, was able to do grocery shopping after the incident. Low suspicion for such low risk injury to cause intracranial injury or bleed. Shared decision making took place, I recommended conservative approach and no CT scan today, patient and wife at bedside are agreeable.   Final Clinical Impressions(s) / ED Diagnoses   Final diagnoses:  Abrasion of scalp, initial encounter    New Prescriptions New Prescriptions   No medications on file   I personally performed the services described in this documentation, which was scribed in my presence. The recorded information has been reviewed and is accurate.    Kinnie Feil, PA-C 09/27/16 [place], MD 09/27/16 1726

## 2016-09-27 NOTE — ED Triage Notes (Signed)
Pt was stooped down to enter at shopping carts entry at [facility] top of head on metal frame-skin tear noted to top of head-no LCO with injury-NAD-steady gait

## 2017-04-10 ENCOUNTER — Ambulatory Visit (INDEPENDENT_AMBULATORY_CARE_PROVIDER_SITE_OTHER): Payer: Medicare Other | Admitting: Ophthalmology

## 2017-06-04 ENCOUNTER — Ambulatory Visit (INDEPENDENT_AMBULATORY_CARE_PROVIDER_SITE_OTHER): Payer: Medicare Other | Admitting: Ophthalmology

## 2017-06-04 DIAGNOSIS — H353121 Nonexudative age-related macular degeneration, left eye, early dry stage: Secondary | ICD-10-CM

## 2017-06-04 DIAGNOSIS — E11319 Type 2 diabetes mellitus with unspecified diabetic retinopathy without macular edema: Secondary | ICD-10-CM

## 2017-06-04 DIAGNOSIS — E113293 Type 2 diabetes mellitus with mild nonproliferative diabetic retinopathy without macular edema, bilateral: Secondary | ICD-10-CM

## 2017-06-04 DIAGNOSIS — H43813 Vitreous degeneration, bilateral: Secondary | ICD-10-CM

## 2017-06-04 DIAGNOSIS — H2513 Age-related nuclear cataract, bilateral: Secondary | ICD-10-CM

## 2017-06-04 DIAGNOSIS — H35033 Hypertensive retinopathy, bilateral: Secondary | ICD-10-CM | POA: Diagnosis not present

## 2017-06-04 DIAGNOSIS — I10 Essential (primary) hypertension: Secondary | ICD-10-CM

## 2018-06-08 ENCOUNTER — Encounter (INDEPENDENT_AMBULATORY_CARE_PROVIDER_SITE_OTHER): Payer: Medicare Other | Admitting: Ophthalmology

## 2018-06-08 DIAGNOSIS — E113293 Type 2 diabetes mellitus with mild nonproliferative diabetic retinopathy without macular edema, bilateral: Secondary | ICD-10-CM | POA: Diagnosis not present

## 2018-06-08 DIAGNOSIS — I10 Essential (primary) hypertension: Secondary | ICD-10-CM | POA: Diagnosis not present

## 2018-06-08 DIAGNOSIS — H35033 Hypertensive retinopathy, bilateral: Secondary | ICD-10-CM | POA: Diagnosis not present

## 2018-06-08 DIAGNOSIS — H43813 Vitreous degeneration, bilateral: Secondary | ICD-10-CM

## 2018-06-08 DIAGNOSIS — E11319 Type 2 diabetes mellitus with unspecified diabetic retinopathy without macular edema: Secondary | ICD-10-CM | POA: Diagnosis not present

## 2018-06-08 DIAGNOSIS — H2513 Age-related nuclear cataract, bilateral: Secondary | ICD-10-CM

## 2019-06-09 ENCOUNTER — Encounter (INDEPENDENT_AMBULATORY_CARE_PROVIDER_SITE_OTHER): Payer: Medicare Other | Admitting: Ophthalmology

## 2019-09-13 ENCOUNTER — Other Ambulatory Visit: Payer: Self-pay | Admitting: Orthopedic Surgery

## 2019-09-13 DIAGNOSIS — M79661 Pain in right lower leg: Secondary | ICD-10-CM

## 2019-09-14 ENCOUNTER — Ambulatory Visit
Admission: RE | Admit: 2019-09-14 | Discharge: 2019-09-14 | Disposition: A | Discharge status: Home / Self Care | Payer: Medicare Other | Source: Ambulatory Visit | Attending: Orthopedic Surgery | Admitting: Orthopedic Surgery

## 2019-09-14 DIAGNOSIS — M79661 Pain in right lower leg: Secondary | ICD-10-CM

## 2019-10-19 ENCOUNTER — Other Ambulatory Visit (HOSPITAL_COMMUNITY): Payer: Self-pay | Admitting: Orthopedic Surgery

## 2019-10-19 ENCOUNTER — Ambulatory Visit (HOSPITAL_COMMUNITY)
Admission: RE | Admit: 2019-10-19 | Discharge: 2019-10-19 | Disposition: A | Discharge status: Home / Self Care | Payer: Medicare Other | Source: Ambulatory Visit | Attending: Cardiovascular Disease | Admitting: Cardiovascular Disease

## 2019-10-19 ENCOUNTER — Other Ambulatory Visit: Payer: Self-pay

## 2019-10-19 DIAGNOSIS — M79661 Pain in right lower leg: Secondary | ICD-10-CM | POA: Insufficient documentation

## 2019-10-19 DIAGNOSIS — M7989 Other specified soft tissue disorders: Secondary | ICD-10-CM

## 2019-12-06 ENCOUNTER — Other Ambulatory Visit: Payer: Self-pay

## 2019-12-06 ENCOUNTER — Encounter (INDEPENDENT_AMBULATORY_CARE_PROVIDER_SITE_OTHER): Payer: Medicare Other | Admitting: Ophthalmology

## 2019-12-06 DIAGNOSIS — H35372 Puckering of macula, left eye: Secondary | ICD-10-CM

## 2019-12-06 DIAGNOSIS — H35033 Hypertensive retinopathy, bilateral: Secondary | ICD-10-CM | POA: Diagnosis not present

## 2019-12-06 DIAGNOSIS — E113293 Type 2 diabetes mellitus with mild nonproliferative diabetic retinopathy without macular edema, bilateral: Secondary | ICD-10-CM

## 2019-12-06 DIAGNOSIS — H43813 Vitreous degeneration, bilateral: Secondary | ICD-10-CM

## 2019-12-06 DIAGNOSIS — H43821 Vitreomacular adhesion, right eye: Secondary | ICD-10-CM

## 2019-12-06 DIAGNOSIS — E11319 Type 2 diabetes mellitus with unspecified diabetic retinopathy without macular edema: Secondary | ICD-10-CM

## 2019-12-06 DIAGNOSIS — I10 Essential (primary) hypertension: Secondary | ICD-10-CM | POA: Diagnosis not present

## 2019-12-23 ENCOUNTER — Ambulatory Visit: Payer: Medicare Other | Attending: Internal Medicine

## 2019-12-23 DIAGNOSIS — Z23 Encounter for immunization: Secondary | ICD-10-CM

## 2019-12-23 NOTE — Progress Notes (Signed)
   Covid-19 Vaccination Clinic  Name:  Tim Morgan    MRN: [id number] DOB: 12-13-1932  12/23/2019  Mr. Kriesel was observed post Covid-19 immunization for 15 minutes without incident. He was provided with Vaccine Information Sheet and instruction to access the V-Safe system.   Mr. Sasso was instructed to call 911 with any severe reactions post vaccine: Marland Kitchen Difficulty breathing  . Swelling of face and throat  . A fast heartbeat  . A bad rash all over body  . Dizziness and weakness   Immunizations Administered    Name Date Dose VIS Date Route   Pfizer COVID-19 Vaccine 12/23/2019 10:35 AM 0.3 mL 08/25/2018 Intramuscular   Manufacturer: [employer], [employer]   Lot: [id number]   [place]: [address]-9

## 2020-01-13 ENCOUNTER — Ambulatory Visit: Payer: Medicare Other | Attending: Internal Medicine

## 2020-01-13 DIAGNOSIS — Z23 Encounter for immunization: Secondary | ICD-10-CM

## 2020-01-13 NOTE — Progress Notes (Signed)
   Covid-19 Vaccination Clinic  Name:  Tim Morgan    MRN: [id number] DOB: Mar 08, 1933  01/13/2020  Mr. Tim Morgan was observed post Covid-19 immunization for 15 minutes without incident. He was provided with Vaccine Information Sheet and instruction to access the V-Safe system.   Mr. Tim Morgan was instructed to call 911 with any severe reactions post vaccine: Marland Kitchen Difficulty breathing  . Swelling of face and throat  . A fast heartbeat  . A bad rash all over body  . Dizziness and weakness   Immunizations Administered    Name Date Dose VIS Date Route   Pfizer COVID-19 Vaccine 01/13/2020 10:24 AM 0.3 mL 08/25/2018 Intramuscular   Manufacturer: [employer], [employer]   Lot: [id number]   [place]: [id number]

## 2020-08-23 ENCOUNTER — Other Ambulatory Visit: Payer: Self-pay | Admitting: Orthopedic Surgery

## 2020-08-23 DIAGNOSIS — M79651 Pain in right thigh: Secondary | ICD-10-CM

## 2020-09-06 LAB — HEPATIC FUNCTION PANEL
ALT: 20 U/L (ref 10–40)
AST: 23 (ref 14–40)
Alkaline Phosphatase: 54 (ref 25–125)
Bilirubin, Total: 0.5

## 2020-09-06 LAB — BASIC METABOLIC PANEL
BUN: 29 — AB (ref 4–21)
CO2: 29 — AB (ref 13–22)
Chloride: 10 — AB (ref 99–108)
Creatinine: 1.1 (ref 0.6–1.3)
Glucose: 151
Potassium: 5.1 mEq/L (ref 3.5–5.1)
Sodium: 137 (ref 137–147)

## 2020-09-06 LAB — CBC AND DIFFERENTIAL
HCT: 41 (ref 41–53)
Hemoglobin: 13 — AB (ref 13.5–17.5)
Platelets: 220 10*3/uL (ref 150–400)
WBC: 5.6

## 2020-09-06 LAB — LIPID PANEL
Cholesterol: 141 (ref 0–200)
HDL: 52 (ref 35–70)
LDL Cholesterol: 64
Triglycerides: 127 (ref 40–160)

## 2020-09-06 LAB — CBC: RBC: 4.31 (ref 3.87–5.11)

## 2020-09-06 LAB — COMPREHENSIVE METABOLIC PANEL
Calcium: 8.8 (ref 8.7–10.7)
eGFR: 63

## 2020-09-06 LAB — VITAMIN D 25 HYDROXY (VIT D DEFICIENCY, FRACTURES): Vit D, 25-Hydroxy: 38.05

## 2020-09-06 LAB — HEMOGLOBIN A1C: Hemoglobin A1C: 7.8

## 2020-09-08 ENCOUNTER — Other Ambulatory Visit: Payer: Self-pay

## 2020-09-08 ENCOUNTER — Ambulatory Visit
Admission: RE | Admit: 2020-09-08 | Discharge: 2020-09-08 | Disposition: A | Discharge status: Home / Self Care | Payer: Medicare Other | Source: Ambulatory Visit | Attending: Orthopedic Surgery | Admitting: Orthopedic Surgery

## 2020-09-08 DIAGNOSIS — M79651 Pain in right thigh: Secondary | ICD-10-CM

## 2020-12-07 ENCOUNTER — Encounter (INDEPENDENT_AMBULATORY_CARE_PROVIDER_SITE_OTHER): Payer: Medicare Other | Admitting: Ophthalmology

## 2020-12-26 ENCOUNTER — Encounter (INDEPENDENT_AMBULATORY_CARE_PROVIDER_SITE_OTHER): Payer: Medicare Other | Admitting: Ophthalmology

## 2021-01-26 LAB — LIPID PANEL
Cholesterol: 111 (ref 0–200)
HDL: 47 (ref 35–70)
LDL Cholesterol: 30
Triglycerides: 169 — AB (ref 40–160)

## 2021-01-26 LAB — COMPREHENSIVE METABOLIC PANEL
Albumin: 3.7 (ref 3.5–5.0)
Calcium: 8.8 (ref 8.7–10.7)
Globulin: 2.2
eGFR: 59

## 2021-01-26 LAB — HEPATIC FUNCTION PANEL
ALT: 18 U/L (ref 10–40)
AST: 18 (ref 14–40)
Alkaline Phosphatase: 49 (ref 25–125)
Bilirubin, Total: 0.5

## 2021-01-26 LAB — VITAMIN D 25 HYDROXY (VIT D DEFICIENCY, FRACTURES): Vit D, 25-Hydroxy: 61.73

## 2021-01-26 LAB — BASIC METABOLIC PANEL
CO2: 26 — AB (ref 13–22)
Chloride: 104 (ref 99–108)
Glucose: 159
Potassium: 4.5 mEq/L (ref 3.5–5.1)
Sodium: 136 — AB (ref 137–147)

## 2021-01-26 LAB — CBC AND DIFFERENTIAL
HCT: 37 — AB (ref 41–53)
Hemoglobin: 12.4 — AB (ref 13.5–17.5)
Platelets: 243 10*3/uL (ref 150–400)
WBC: 6

## 2021-01-26 LAB — CBC: RBC: 4.05 (ref 3.87–5.11)

## 2021-01-26 LAB — HEMOGLOBIN A1C: Hemoglobin A1C: 6.9

## 2021-02-13 ENCOUNTER — Emergency Department (HOSPITAL_BASED_OUTPATIENT_CLINIC_OR_DEPARTMENT_OTHER)
Admission: EM | Admit: 2021-02-13 | Discharge: 2021-02-13 | Disposition: A | Discharge status: Home / Self Care | Payer: Medicare Other | Attending: Emergency Medicine | Admitting: Emergency Medicine

## 2021-02-13 ENCOUNTER — Other Ambulatory Visit: Payer: Self-pay

## 2021-02-13 ENCOUNTER — Encounter (INDEPENDENT_AMBULATORY_CARE_PROVIDER_SITE_OTHER): Payer: Medicare Other | Admitting: Ophthalmology

## 2021-02-13 ENCOUNTER — Emergency Department (HOSPITAL_BASED_OUTPATIENT_CLINIC_OR_DEPARTMENT_OTHER): Payer: Medicare Other

## 2021-02-13 ENCOUNTER — Encounter (HOSPITAL_BASED_OUTPATIENT_CLINIC_OR_DEPARTMENT_OTHER): Payer: Self-pay

## 2021-02-13 DIAGNOSIS — R600 Localized edema: Secondary | ICD-10-CM | POA: Insufficient documentation

## 2021-02-13 DIAGNOSIS — I10 Essential (primary) hypertension: Secondary | ICD-10-CM

## 2021-02-13 DIAGNOSIS — H35033 Hypertensive retinopathy, bilateral: Secondary | ICD-10-CM | POA: Diagnosis not present

## 2021-02-13 DIAGNOSIS — H43813 Vitreous degeneration, bilateral: Secondary | ICD-10-CM

## 2021-02-13 DIAGNOSIS — Z79899 Other long term (current) drug therapy: Secondary | ICD-10-CM | POA: Diagnosis not present

## 2021-02-13 DIAGNOSIS — Z7984 Long term (current) use of oral hypoglycemic drugs: Secondary | ICD-10-CM | POA: Diagnosis not present

## 2021-02-13 DIAGNOSIS — Z794 Long term (current) use of insulin: Secondary | ICD-10-CM | POA: Insufficient documentation

## 2021-02-13 DIAGNOSIS — E119 Type 2 diabetes mellitus without complications: Secondary | ICD-10-CM | POA: Insufficient documentation

## 2021-02-13 DIAGNOSIS — J4530 Mild persistent asthma, uncomplicated: Secondary | ICD-10-CM | POA: Insufficient documentation

## 2021-02-13 DIAGNOSIS — M79604 Pain in right leg: Secondary | ICD-10-CM

## 2021-02-13 DIAGNOSIS — J449 Chronic obstructive pulmonary disease, unspecified: Secondary | ICD-10-CM | POA: Diagnosis not present

## 2021-02-13 DIAGNOSIS — Z85820 Personal history of malignant melanoma of skin: Secondary | ICD-10-CM | POA: Diagnosis not present

## 2021-02-13 DIAGNOSIS — E113293 Type 2 diabetes mellitus with mild nonproliferative diabetic retinopathy without macular edema, bilateral: Secondary | ICD-10-CM | POA: Diagnosis not present

## 2021-02-13 DIAGNOSIS — Z86718 Personal history of other venous thrombosis and embolism: Secondary | ICD-10-CM | POA: Diagnosis not present

## 2021-02-13 DIAGNOSIS — H43823 Vitreomacular adhesion, bilateral: Secondary | ICD-10-CM

## 2021-02-13 DIAGNOSIS — M79605 Pain in left leg: Secondary | ICD-10-CM | POA: Diagnosis present

## 2021-02-13 HISTORY — DX: Malignant melanoma of skin, unspecified: C43.9

## 2021-02-13 MED ORDER — DICLOFENAC SODIUM 1 % EX GEL: 2.0000 g | Freq: Four times a day (QID) | CUTANEOUS | 0 refills | Status: DC

## 2021-02-13 NOTE — ED Notes (Signed)
Pt in [place]. Their to bring to [place].

## 2021-02-13 NOTE — ED Triage Notes (Signed)
Pt c/o left LE pain x 2 months-worse x today-denies injury-reports hx of blood clot in leg-NAD-steady gait

## 2021-02-13 NOTE — Discharge Instructions (Addendum)
Use voltaren gel  Please follow up with your primary care provider within 5-7 days for re-evaluation of your symptoms. If you do not have a primary care provider, information for a healthcare clinic has been provided for you to make arrangements for follow up care. Please return to the emergency department for any new or worsening symptoms.

## 2021-02-13 NOTE — ED Provider Notes (Signed)
[facility] EMERGENCY DEPARTMENT Provider Note   CSN: [id number] Arrival date & time: 02/13/21  1845     History Chief Complaint  Patient presents with   Leg Pain    Tim Morgan is a 87 y.o. male.  HPI  87 year old male with a history of bronchitis, COPD, diabetes, edema of the lower extremity, GERD, hypertension, LVH, melanoma, who presents to the emergency department today for evaluation of left leg pain.  States that he has had on and off leg pain ever since he had surgery on his right knee a few years ago.  Since then his right knee has been less stable so he is mainly used his left leg for stabilization and bears most of his weight on that side.  He has had intermittent issues with posterior leg pain since then and today his pain became much worse.  He has a history of a DVT in that leg so he was concerned that he may have had recurrence of this.  He denies any chest pain, shortness of breath, back pain, numbness or falls.  He has had no recent injuries.  He was previously in physical therapy for his symptoms and just actually finished last week.  Past Medical History:  Diagnosis Date   Acute bronchitis    COPD (chronic obstructive pulmonary disease) ([facility])    Diabetes mellitus    Edema of left lower extremity 07/03/2012   venous doppler-no evidence of thrombus or thrombophlebitis,left gsv valvular insufficency throughout the vein,left SSV appeared patent wiyh no venous insuffiency, left popliteal area questionable rupture Baker's cyst this was considered a abnormal doppler   GERD (gastroesophageal reflux disease)    History of back injury 07/01/1988   L 4 to , no surgery healed on own   Hypertension 12/06/2011   echo-EF 65-70%    Left ventricular hypertrophy    Melanoma ([place])    Vitamin D deficiency     Patient Active Problem List   Diagnosis Date Noted   Essential hypertension 09/10/2014   Upper airway cough syndrome 09/10/2014   Mild persistent asthma in  adult without complication 99991111   Chronic sinusitis/chronic cough  05/19/2014   Acute medial meniscal tear 11/18/2012    Past Surgical History:  Procedure Laterality Date   ANKLE SURGERY Left 07/01/1988   APPENDECTOMY  age 62   BRAIN SURGERY  07/01/1993   concusion   HERNIA REPAIR     x 2   KNEE ARTHROSCOPY Left 11/18/2012   Procedure: LEFT ARTHROSCOPY KNEE WITH DEBRIDEMENT AND CHRONDROLPLASTY;  Surgeon: Gearlean Alf, MD;  Location: [facility] ORS;  Service: Orthopedics;  Laterality: Left;   KNEE SURGERY     NASAL SINUS SURGERY Bilateral    to open up ear tubes       No family history on file.  Social History   Tobacco Use   Smoking status: Never   Smokeless tobacco: Never  Substance Use Topics   Alcohol use: No   Drug use: No    Home Medications Prior to Admission medications   Medication Sig Start Date End Date Taking? Authorizing Provider  amLODipine-olmesartan (AZOR) 5-40 MG per tablet Take 1 tablet by mouth every morning.     [provider]  amoxicillin-clavulanate (AUGMENTIN) 875-125 MG per tablet Augmentin 875 mg take one pill twice daily    take at breakfast and supper with large glass of water. Patient not taking: Reported on 10/18/2015 09/09/14   Tanda Rockers, MD  beclomethasone (QVAR) 80  MCG/ACT inhaler Inhale 2 puffs into the lungs 2 (two) times daily.    [provider]  budesonide-formoterol (SYMBICORT) 160-4.5 MCG/ACT inhaler Inhale 2 puffs into the lungs 2 (two) times daily.    [provider]  calcium citrate-vitamin D (CITRACAL+D) 315-200 MG-UNIT per tablet Take 1 tablet by mouth 2 (two) times daily.    [provider]  Chromium-Cinnamon (CINNAMON PLUS CHROMIUM PO) Take 2-3 capsules by mouth 2 (two) times daily.    [provider]  Coenzyme Q10 (COQ10) 100 MG CAPS Take 1 capsule by mouth daily.    [provider]  Dextromethorphan-Guaifenesin (MUCINEX DM MAXIMUM STRENGTH PO) Take 1 tablet by mouth  daily as needed. For congestion    [provider]  diclofenac Sodium (VOLTAREN) 1 % GEL Apply 2 g topically 4 (four) times daily. 02/13/21   Salene Mohamud S, PA-C  docusate sodium (COLACE) 100 MG capsule Take 100 mg by mouth at bedtime.    [provider]  esomeprazole (NEXIUM) 40 MG capsule Take 40 mg by mouth daily.      [provider]  famotidine (PEPCID) 20 MG tablet Take 20 mg by mouth at bedtime. Reported on 10/18/2015 07/09/14   [provider]  fluticasone (FLONASE) 50 MCG/ACT nasal spray Place 2 sprays into both nostrils daily.    [provider]  furosemide (LASIX) 20 MG tablet Take 20 mg by mouth.    [provider]  hydrOXYzine (ATARAX/VISTARIL) 25 MG tablet Take 25 mg by mouth 3 (three) times daily as needed. 06/13/14   [provider]  insulin aspart (NOVOLOG) 100 UNIT/ML FlexPen Inject 6 Units into the skin 3 (three) times daily with meals.    [provider]  insulin glargine (LANTUS) 100 UNIT/ML injection Inject 18 Units into the skin at bedtime.    [provider]  ipratropium-albuterol (DUONEB) 0.5-2.5 (3) MG/3ML SOLN Reported on 10/18/2015 05/03/14   [provider]  magnesium oxide (MAG-OX) 400 MG tablet Take 400 mg by mouth daily.    [provider]  metFORMIN (GLUMETZA) 500 MG (MOD) 24 hr tablet Take 500 mg by mouth 2 (two) times daily with a meal.    [provider]  methocarbamol (ROBAXIN) 500 MG tablet Take 500 mg by mouth 2 (two) times daily.     [provider]  Multiple Vitamin (MULTIVITAMIN WITH MINERALS) TABS Take 1 tablet by mouth daily.    [provider]  Multiple Vitamins-Minerals (PRESERVISION AREDS) TABS Take 1 tablet by mouth daily.    [provider]  omeprazole (PRILOSEC) 20 MG capsule Take 20 mg by mouth daily.    [provider]  Potassium 99 MG TABS Take 99 mg by mouth daily.    [provider]  Respiratory  Therapy Supplies (FLUTTER) DEVI Use as directed 10/03/14   Tanda Rockers, MD  rosuvastatin (CRESTOR) 40 MG tablet Take 40 mg by mouth at bedtime.    [provider]  sildenafil (VIAGRA) 100 MG tablet Take 100 mg by mouth daily as needed for erectile dysfunction.    [provider]  tamsulosin (FLOMAX) 0.4 MG CAPS capsule Take 0.4 mg by mouth daily.    [provider]  terbinafine (LAMISIL) 250 MG tablet Take 250 mg by mouth daily.    [provider]  traMADol (ULTRAM) 50 MG tablet 1-2 every 4 hours as needed for cough or pain Patient not taking: Reported on 10/18/2015 09/09/14   Tanda Rockers, MD  triamcinolone cream (KENALOG) 0.1 % Apply 1 application topically daily as needed. For rash/ itching    [provider]    Allergies    Patient has no known allergies.  Review of Systems   Review of Systems  Constitutional:  Negative for fever.  Respiratory:  Negative for shortness of breath.   Cardiovascular:  Positive for leg swelling. Negative for chest pain.  Musculoskeletal:        Left leg pain  Skin:  Negative for color change.  Neurological:  Negative for weakness and numbness.   Physical Exam Updated Vital Signs BP 137/67 (BP Location: Left Arm)   Pulse 70   Temp 98.2 F (36.8 C) (Oral)   Resp 20   Ht '5\' 10"'$  (1.778 m)   Wt 100.2 kg   SpO2 98%   BMI 31.71 kg/m   Physical Exam Constitutional:      General: He is not in acute distress.    Appearance: He is well-developed.  Eyes:     Conjunctiva/sclera: Conjunctivae normal.  Cardiovascular:     Rate and Rhythm: Normal rate and regular rhythm.  Pulmonary:     Effort: Pulmonary effort is normal.     Breath sounds: Normal breath sounds.  Musculoskeletal:     Comments: BLE edema that is symmetric (chronic however pt feels left leg is more swollen today). TTP along the leg hamstring. Pain is elicited with flexion at the knee. He has no pain with extension of the knee. No calf TTP.  DP pulses intact and symmetric  Skin:    General: Skin is warm and dry.  Neurological:     Mental Status: He is alert and oriented to person, place, and time.    ED Results / Procedures / Treatments   Labs (all labs ordered are listed, but only abnormal results are displayed) Labs Reviewed - No data to display  EKG None  Radiology US Venous Img Lower  Left (DVT Study)  Result Date: 02/13/2021 CLINICAL DATA:  Lower extremity edema EXAM: RIGHT LOWER EXTREMITY VENOUS DOPPLER ULTRASOUND TECHNIQUE: Gray-scale sonography with compression, as well as color and duplex ultrasound, were performed to evaluate the deep venous system(s) from the level of the common femoral vein through the popliteal and proximal calf veins. COMPARISON:  None. FINDINGS: VENOUS Normal compressibility of the common femoral, superficial femoral, and popliteal veins, as well as the visualized calf veins. Visualized portions of profunda femoral vein and great saphenous vein unremarkable. No filling defects to suggest DVT on grayscale or color Doppler imaging. Doppler waveforms show normal direction of venous flow, normal respiratory plasticity and response to augmentation. Limited views of the contralateral common femoral vein are unremarkable. OTHER None. Limitations: none IMPRESSION: Negative. Electronically Signed   By: Ulyses Jarred M.D.   On: 02/13/2021 20:22    Procedures Procedures   Medications Ordered in ED Medications - No data to display  ED Course  I have reviewed the triage vital signs and the nursing notes.  Pertinent labs & imaging results that were available during my care of the patient were reviewed by me and considered in my medical decision making (see chart for details).    MDM Rules/Calculators/A&P                          87 year old male presents for evaluation of left lower extremity pain that has been ongoing but worsened today.  He has a history of DVT in that extremity many  years ago and  was concerned for same.  Ultrasound of the left lower extremity was performed and did not show any evidence of this.  On exam he does have some tenderness along the hamstring on the left side and pain is elicited with engagement of the hamstring on that side as well.  He has normal strength and sensation to the lower extremities and distal perfusion is within normal limits.  I suspect that his symptoms are likely related to a muscle strain.  He was given a prescription for Voltaren gel.  He was previously in physical therapy for similar symptoms and I suggested he follow-up with his PCP as he may need to continue this.  Advised on specific return precautions.  He voices understanding of the plan and reasons to return.  All questions answered.  Patient stable for discharge.  Case discussed with Dr. Dina Rich who is in agreement with the plan  Final Clinical Impression(s) / ED Diagnoses Final diagnoses:  Right leg pain    Rx / DC Orders ED Discharge Orders          Ordered    diclofenac Sodium (VOLTAREN) 1 % GEL  4 times daily,   Status:  Discontinued        02/13/21 2057    diclofenac Sodium (VOLTAREN) 1 % GEL  4 times daily        02/13/21 2105             Katoria Yetman S, PA-C 02/13/21 2147    Lorelle Gibbs, DO 02/14/21 1947

## 2021-03-21 LAB — HM DIABETES EYE EXAM

## 2021-04-03 HISTORY — PX: CATARACT EXTRACTION: SUR2

## 2021-04-25 LAB — HM DIABETES EYE EXAM

## 2021-05-01 HISTORY — PX: CATARACT EXTRACTION: SUR2

## 2021-05-10 LAB — HEPATIC FUNCTION PANEL
ALT: 16 U/L (ref 10–40)
AST: 24 (ref 14–40)
Alkaline Phosphatase: 54 (ref 25–125)
Bilirubin, Total: 0.5

## 2021-05-10 LAB — BASIC METABOLIC PANEL
BUN: 26 — AB (ref 4–21)
CO2: 25 — AB (ref 13–22)
Chloride: 103 (ref 99–108)
Creatinine: 1.2 (ref 0.6–1.3)
Glucose: 107
Potassium: 4.5 mEq/L (ref 3.5–5.1)
Sodium: 140 (ref 137–147)

## 2021-05-10 LAB — CBC AND DIFFERENTIAL
HCT: 37 — AB (ref 41–53)
Hemoglobin: 12.9 — AB (ref 13.5–17.5)
Platelets: 256 10*3/uL (ref 150–400)
WBC: 5.3

## 2021-05-10 LAB — COMPREHENSIVE METABOLIC PANEL
Albumin: 3.8 (ref 3.5–5.0)
Calcium: 9.3 (ref 8.7–10.7)
Globulin: 2.4
eGFR: 56

## 2021-05-10 LAB — LIPID PANEL
Cholesterol: 227 — AB (ref 0–200)
HDL: 52 (ref 35–70)
LDL Cholesterol: 128
Triglycerides: 237 — AB (ref 40–160)

## 2021-05-10 LAB — HEMOGLOBIN A1C: Hemoglobin A1C: 7.1

## 2021-05-10 LAB — VITAMIN D 25 HYDROXY (VIT D DEFICIENCY, FRACTURES): Vit D, 25-Hydroxy: 35.89

## 2021-05-10 LAB — CBC: RBC: 4.11 (ref 3.87–5.11)

## 2021-08-09 LAB — COMPREHENSIVE METABOLIC PANEL
Albumin: 4 (ref 3.5–5.0)
Calcium: 9.5 (ref 8.7–10.7)
Globulin: 2.6
eGFR: 54

## 2021-08-09 LAB — BASIC METABOLIC PANEL
BUN: 29 — AB (ref 4–21)
CO2: 25 — AB (ref 13–22)
Chloride: 103 (ref 99–108)
Creatinine: 1.3 (ref 0.6–1.3)
Glucose: 116
Potassium: 4.6 mEq/L (ref 3.5–5.1)
Sodium: 142 (ref 137–147)

## 2021-08-09 LAB — CBC AND DIFFERENTIAL
HCT: 42 (ref 41–53)
Platelets: 296 10*3/uL (ref 150–400)
WBC: 11

## 2021-08-09 LAB — LIPID PANEL
Cholesterol: 140 (ref 0–200)
HDL: 49 (ref 35–70)
LDL Cholesterol: 61
Triglycerides: 149 (ref 40–160)

## 2021-08-09 LAB — HEPATIC FUNCTION PANEL
ALT: 23 U/L (ref 10–40)
AST: 23 (ref 14–40)
Alkaline Phosphatase: 64 (ref 25–125)
Bilirubin, Total: 0.5

## 2021-08-09 LAB — CBC: RBC: 4.28 (ref 3.87–5.11)

## 2022-02-14 ENCOUNTER — Encounter (INDEPENDENT_AMBULATORY_CARE_PROVIDER_SITE_OTHER): Payer: Medicare Other | Admitting: Ophthalmology

## 2022-02-14 DIAGNOSIS — H35033 Hypertensive retinopathy, bilateral: Secondary | ICD-10-CM

## 2022-02-14 DIAGNOSIS — E113391 Type 2 diabetes mellitus with moderate nonproliferative diabetic retinopathy without macular edema, right eye: Secondary | ICD-10-CM | POA: Diagnosis not present

## 2022-02-14 DIAGNOSIS — H35371 Puckering of macula, right eye: Secondary | ICD-10-CM

## 2022-02-14 DIAGNOSIS — E113292 Type 2 diabetes mellitus with mild nonproliferative diabetic retinopathy without macular edema, left eye: Secondary | ICD-10-CM | POA: Diagnosis not present

## 2022-02-14 DIAGNOSIS — H43821 Vitreomacular adhesion, right eye: Secondary | ICD-10-CM | POA: Diagnosis not present

## 2022-02-14 DIAGNOSIS — I10 Essential (primary) hypertension: Secondary | ICD-10-CM

## 2022-02-14 DIAGNOSIS — H43813 Vitreous degeneration, bilateral: Secondary | ICD-10-CM

## 2022-02-25 LAB — CBC: RBC: 4.33 (ref 3.87–5.11)

## 2022-02-25 LAB — LIPID PANEL
Cholesterol: 136 (ref 0–200)
HDL: 49 (ref 35–70)
LDL Cholesterol: 62
Triglycerides: 124 (ref 40–160)

## 2022-02-25 LAB — BASIC METABOLIC PANEL
BUN: 28 — AB (ref 4–21)
CO2: 30 — AB (ref 13–22)
Chloride: 102 (ref 99–108)
Creatinine: 1.1 (ref 0.6–1.3)
Glucose: 106
Potassium: 4.6 mEq/L (ref 3.5–5.1)
Sodium: 140 (ref 137–147)

## 2022-02-25 LAB — HEPATIC FUNCTION PANEL
Alkaline Phosphatase: 52 (ref 25–125)
Bilirubin, Total: 0.6

## 2022-02-25 LAB — COMPREHENSIVE METABOLIC PANEL
Albumin: 4.3 (ref 3.5–5.0)
Globulin: 2.2
eGFR: 61

## 2022-02-25 LAB — CBC AND DIFFERENTIAL
HCT: 41 (ref 41–53)
Hemoglobin: 13.4 — AB (ref 13.5–17.5)
Platelets: 245 10*3/uL (ref 150–400)
WBC: 5.6

## 2022-02-25 LAB — PSA: PSA: 0.07

## 2022-07-21 IMAGING — CT CT FEMUR *R* W/O CM
1 series · 12 of 14 positions shown, 15 images · non-contrast
Comparison: None.

CLINICAL DATA: Right thigh pain and knee pain. No known injury.

EXAM:
CT OF THE LOWER RIGHT EXTREMITY WITHOUT CONTRAST
TECHNIQUE: Multidetector CT imaging of the right lower extremity was performed
according to the standard protocol.

[Series 4: soft tissue lower extremity · axial · 0.54mm/px · z∈[-522,-64]mm · 12 of 271 slices shown, 15 images]
[im 21/271  soft-tissue]
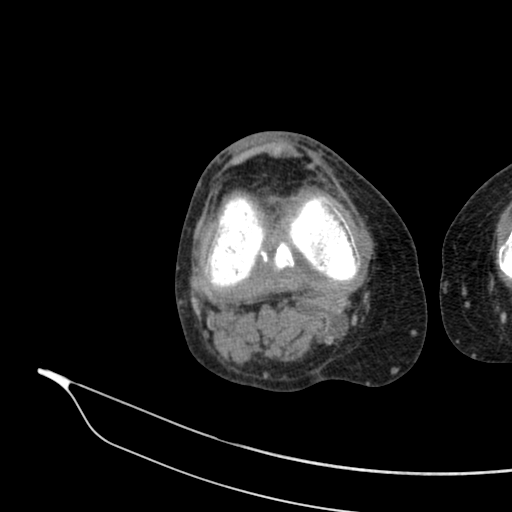
[im 21/271  bone]
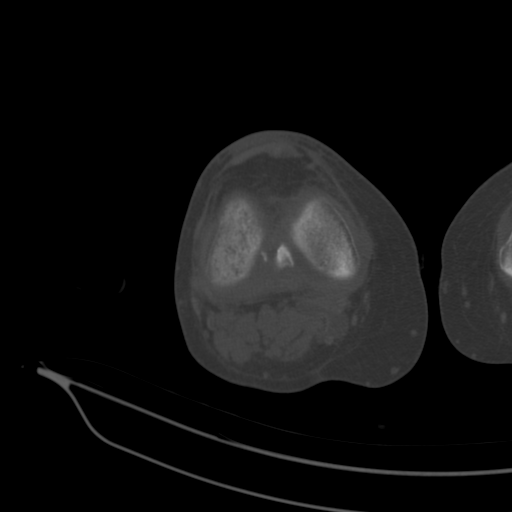
[im 42/271  bone]
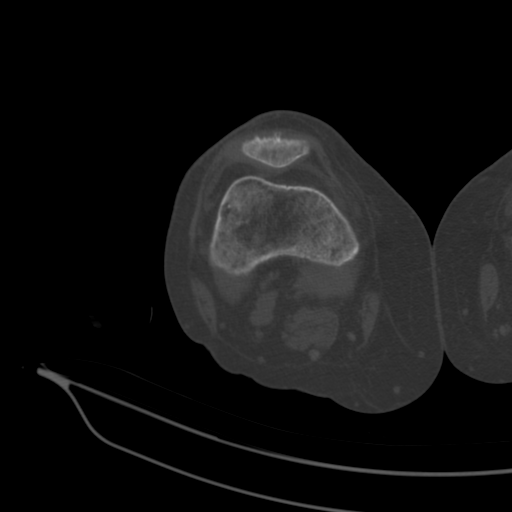
[im 63/271  bone]
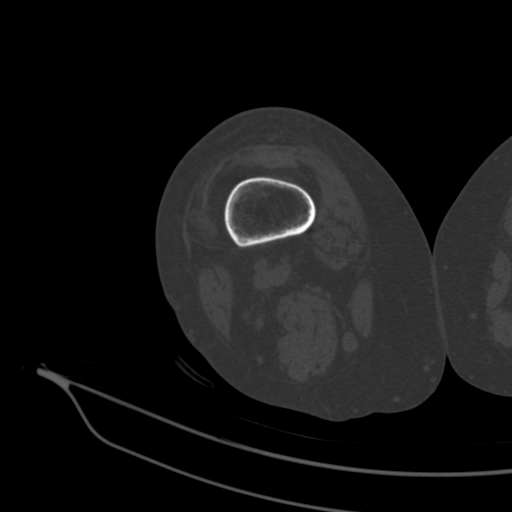
[im 84/271  bone]
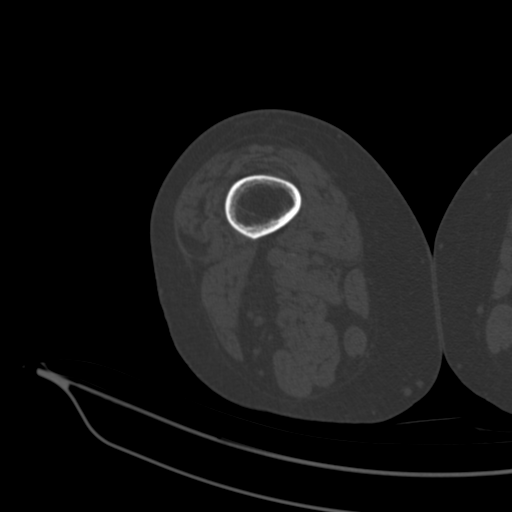
[im 104/271  soft-tissue]
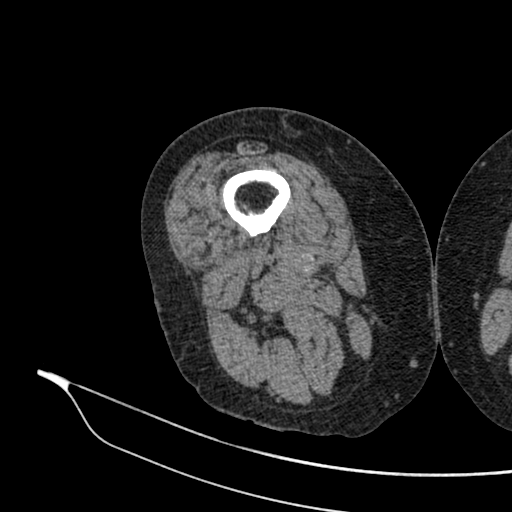
[im 104/271  bone]
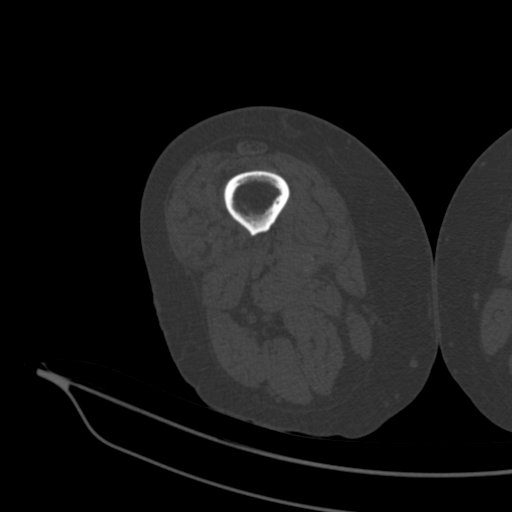
[im 125/271  bone]
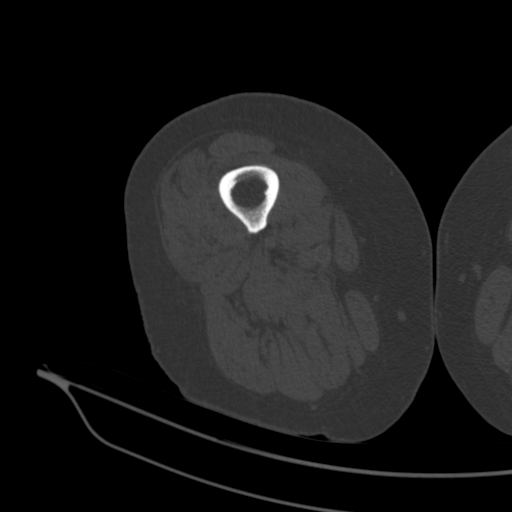
[im 146/271  bone]
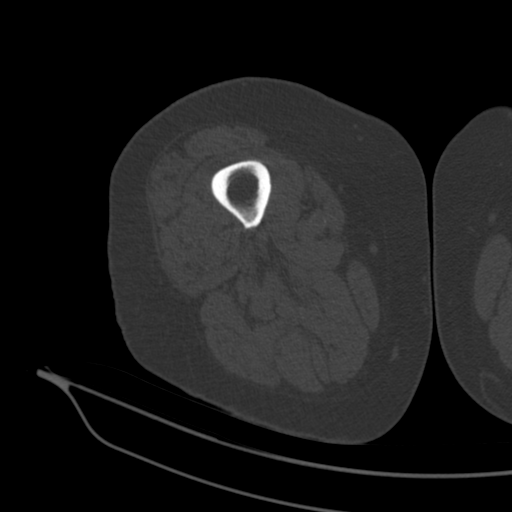
[im 167/271  bone]
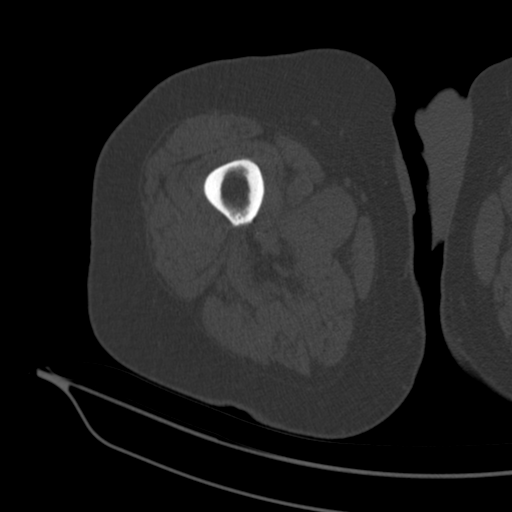
[im 187/271  soft-tissue]
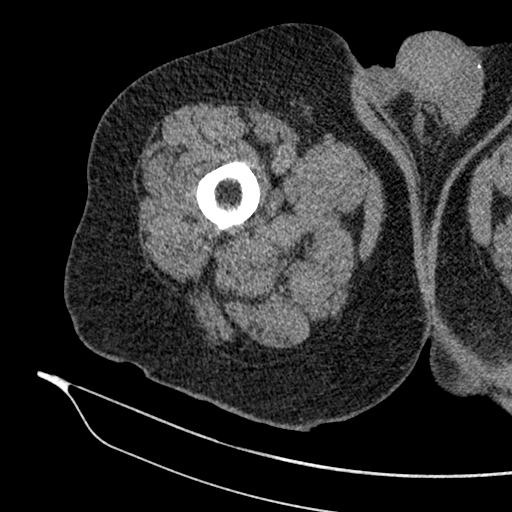
[im 187/271  bone]
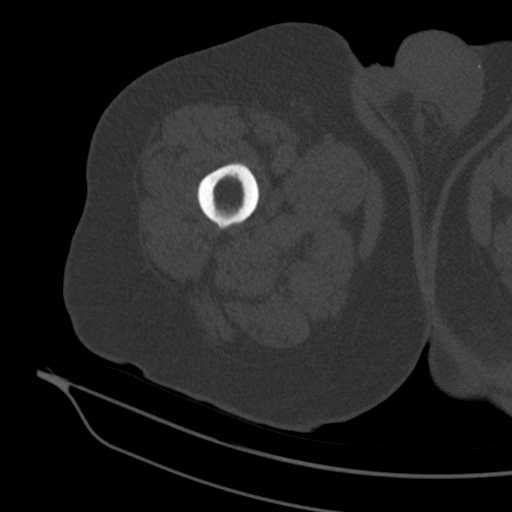
[im 208/271  bone]
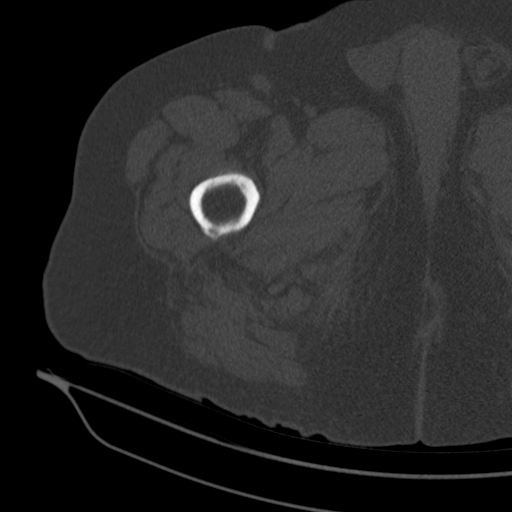
[im 229/271  bone]
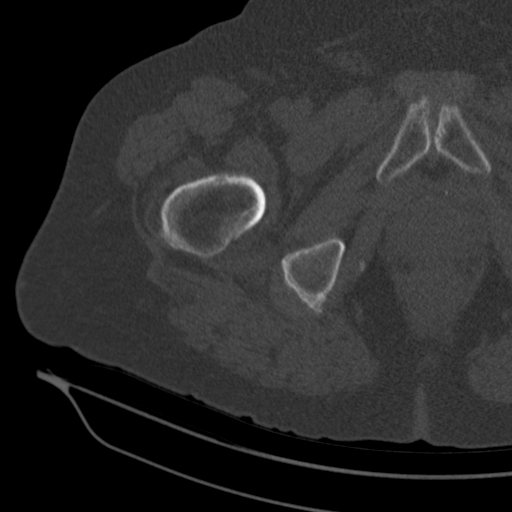
[im 250/271  bone]
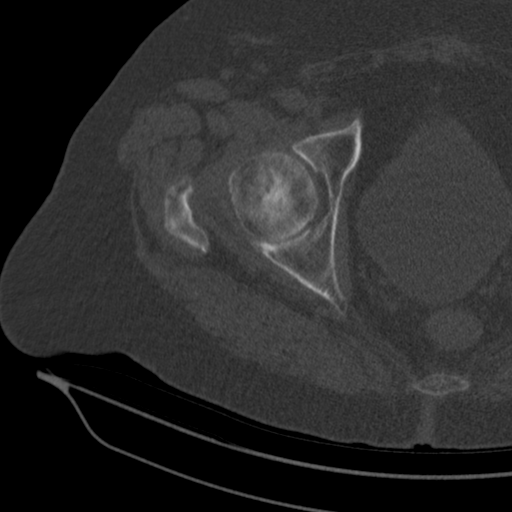

[12 of 14 positions shown; findings below may reference images not displayed]

FINDINGS: Bones/Joint/Cartilage

No fracture or dislocation. Normal alignment. No right hip joint
effusion.

Subtle subchondral sclerosis in the superior right femoral head
concerning for avascular necrosis without articular surface
collapse.

Mild degenerative changes of the pubic symphysis. Mild medial
femorotibial compartment joint space narrowing. Mild lateral
patellofemoral compartment joint space narrowing. Small knee joint
effusion. Small Baker's cyst.

Ligaments

Ligaments are suboptimally evaluated by CT.

Muscles and Tendons
Muscles are normal. No muscle atrophy. No intramuscular fluid
collection or hematoma.

Soft tissue
No fluid collection or hematoma. No soft tissue mass. Peripheral
vascular atherosclerotic disease.
IMPRESSION: 1. No acute fracture or dislocation.
2. Subtle subchondral sclerosis in the superior right femoral head
concerning for avascular necrosis without articular surface
collapse.
3. Mild osteoarthritis of the right knee.

## 2022-08-29 ENCOUNTER — Other Ambulatory Visit: Payer: Self-pay

## 2022-08-29 ENCOUNTER — Emergency Department (HOSPITAL_BASED_OUTPATIENT_CLINIC_OR_DEPARTMENT_OTHER)
Admission: EM | Admit: 2022-08-29 | Discharge: 2022-08-29 | Disposition: A | Discharge status: Home / Self Care | Payer: Medicare Other | Attending: Emergency Medicine | Admitting: Emergency Medicine

## 2022-08-29 DIAGNOSIS — L03211 Cellulitis of face: Secondary | ICD-10-CM

## 2022-08-29 DIAGNOSIS — E119 Type 2 diabetes mellitus without complications: Secondary | ICD-10-CM | POA: Insufficient documentation

## 2022-08-29 DIAGNOSIS — Z794 Long term (current) use of insulin: Secondary | ICD-10-CM | POA: Diagnosis not present

## 2022-08-29 DIAGNOSIS — R220 Localized swelling, mass and lump, head: Secondary | ICD-10-CM | POA: Diagnosis present

## 2022-08-29 DIAGNOSIS — Z7984 Long term (current) use of oral hypoglycemic drugs: Secondary | ICD-10-CM | POA: Diagnosis not present

## 2022-08-29 MED ORDER — AMOXICILLIN-POT CLAVULANATE 875-125 MG PO TABS: 1.0000 | ORAL_TABLET | Freq: Two times a day (BID) | ORAL | 0 refills | Status: DC

## 2022-08-29 NOTE — ED Provider Notes (Signed)
EMERGENCY DEPARTMENT AT [facility] Provider Note   CSN: [id number] Arrival date & time: 08/29/22  1815     History  Chief Complaint  Patient presents with   Insect Bite    Tim Morgan is a 89 y.o. male.  He has a history of diabetes.  He is complaining of some swelling and redness to his left jaw that is been going on for couple of days.  He recalls cleaning the garage and sweeping away some spider webs and is not sure if he may have been bitten.  He denies any dental pain.  It seems like its gotten worse since yesterday.  No fevers or chills.  No difficulty swallowing or speaking.  The history is provided by the patient.  Rash Location:  Face Facial rash location: left jaw. Quality: painful, redness and swelling   Pain details:    Progression:  Worsening Onset quality:  Gradual Duration:  2 days Timing:  Constant Progression:  Worsening Chronicity:  New Context: insect bite/sting (??)   Relieved by:  None tried Worsened by:  Nothing Ineffective treatments:  None tried Associated symptoms: no fever, no hoarse voice, no shortness of breath, no sore throat, no throat swelling and no tongue swelling        Home Medications Prior to Admission medications   Medication Sig Start Date End Date Taking? Authorizing Provider  amoxicillin-clavulanate (AUGMENTIN) 875-125 MG tablet Take 1 tablet by mouth every 12 (twelve) hours. 08/29/22  Yes Hayden Rasmussen, MD  amLODipine-olmesartan (AZOR) 5-40 MG per tablet Take 1 tablet by mouth every morning.     [provider]  beclomethasone (QVAR) 80 MCG/ACT inhaler Inhale 2 puffs into the lungs 2 (two) times daily.    [provider]  budesonide-formoterol (SYMBICORT) 160-4.5 MCG/ACT inhaler Inhale 2 puffs into the lungs 2 (two) times daily.    [provider]  calcium citrate-vitamin D (CITRACAL+D) 315-200 MG-UNIT per tablet Take 1 tablet by mouth 2 (two) times daily.    [provider]  Chromium-Cinnamon (CINNAMON PLUS CHROMIUM PO) Take 2-3 capsules by mouth 2 (two) times daily.    [provider]  Coenzyme Q10 (COQ10) 100 MG CAPS Take 1 capsule by mouth daily.    [provider]  Dextromethorphan-Guaifenesin (MUCINEX DM MAXIMUM STRENGTH PO) Take 1 tablet by mouth daily as needed. For congestion    [provider]  diclofenac Sodium (VOLTAREN) 1 % GEL Apply 2 g topically 4 (four) times daily. 02/13/21   Couture, Cortni S, PA-C  docusate sodium (COLACE) 100 MG capsule Take 100 mg by mouth at bedtime.    [provider]  esomeprazole (NEXIUM) 40 MG capsule Take 40 mg by mouth daily.      [provider]  famotidine (PEPCID) 20 MG tablet Take 20 mg by mouth at bedtime. Reported on 10/18/2015 07/09/14   [provider]  fluticasone (FLONASE) 50 MCG/ACT nasal spray Place 2 sprays into both nostrils daily.    [provider]  furosemide (LASIX) 20 MG tablet Take 20 mg by mouth.    [provider]  hydrOXYzine (ATARAX/VISTARIL) 25 MG tablet Take 25 mg by mouth 3 (three) times daily as needed. 06/13/14   [provider]  insulin aspart (NOVOLOG) 100 UNIT/ML FlexPen Inject 6 Units into the skin 3 (three) times daily with meals.    [provider]  insulin glargine (LANTUS) 100 UNIT/ML injection Inject 18 Units into the skin at bedtime.  [provider]  ipratropium-albuterol (DUONEB) 0.5-2.5 (3) MG/3ML SOLN Reported on 10/18/2015 05/03/14   [provider]  magnesium oxide (MAG-OX) 400 MG tablet Take 400 mg by mouth daily.    [provider]  metFORMIN (GLUMETZA) 500 MG (MOD) 24 hr tablet Take 500 mg by mouth 2 (two) times daily with a meal.    [provider]  methocarbamol (ROBAXIN) 500 MG tablet Take 500 mg by mouth 2 (two) times daily.     [provider]  Multiple Vitamin (MULTIVITAMIN WITH MINERALS) TABS Take 1 tablet by mouth daily.     [provider]  Multiple Vitamins-Minerals (PRESERVISION AREDS) TABS Take 1 tablet by mouth daily.    [provider]  omeprazole (PRILOSEC) 20 MG capsule Take 20 mg by mouth daily.    [provider]  Potassium 99 MG TABS Take 99 mg by mouth daily.    [provider]  Respiratory Therapy Supplies (FLUTTER) DEVI Use as directed 10/03/14   Tanda Rockers, MD  rosuvastatin (CRESTOR) 40 MG tablet Take 40 mg by mouth at bedtime.    [provider]  sildenafil (VIAGRA) 100 MG tablet Take 100 mg by mouth daily as needed for erectile dysfunction.    [provider]  tamsulosin (FLOMAX) 0.4 MG CAPS capsule Take 0.4 mg by mouth daily.    [provider]  terbinafine (LAMISIL) 250 MG tablet Take 250 mg by mouth daily.    [provider]  traMADol (ULTRAM) 50 MG tablet 1-2 every 4 hours as needed for cough or pain Patient not taking: Reported on 10/18/2015 09/09/14   Tanda Rockers, MD  triamcinolone cream (KENALOG) 0.1 % Apply 1 application topically daily as needed. For rash/ itching    [provider]      Allergies    Patient has no known allergies.    Review of Systems   Review of Systems  Constitutional:  Negative for fever.  HENT:  Negative for hoarse voice and sore throat.   Respiratory:  Negative for shortness of breath.   Skin:  Positive for rash.    Physical Exam Updated Vital Signs BP 138/71 (BP Location: Left Arm)   Pulse 72   Temp 97.7 F (36.5 C) (Oral)   Resp 17   Wt 90.3 kg   SpO2 100%   BMI 28.55 kg/m  Physical Exam Vitals and nursing note reviewed.  Constitutional:      Appearance: Normal appearance. He is well-developed.  HENT:     Head: Normocephalic and atraumatic.     Comments: He has a swollen tender area on his lower left jaw.  Area is a little bit indurated.  There is no clear skin break.  On palpation of his mouth there is no intraoral lesion appreciated and does not seem to arise  from the gum.    Nose: Nose normal.     Mouth/Throat:     Mouth: Mucous membranes are moist.     Pharynx: Oropharynx is clear.  Eyes:     Conjunctiva/sclera: Conjunctivae normal.  Cardiovascular:     Rate and Rhythm: Normal rate and regular rhythm.  Pulmonary:     Effort: Pulmonary effort is normal.  Musculoskeletal:     Cervical back: Neck supple.  Skin:    General: Skin is warm and dry.  Neurological:     Mental Status: He is alert.     GCS: GCS eye subscore is 4. GCS verbal subscore is 5. GCS  motor subscore is 6.     ED Results / Procedures / Treatments   Labs (all labs ordered are listed, but only abnormal results are displayed) Labs Reviewed - No data to display  EKG None  Radiology No results found.  Procedures Procedures    Medications Ordered in ED Medications - No data to display  ED Course/ Medical Decision Making/ A&P                             Medical Decision Making Risk Prescription drug management.   This patient complains of possible bite to jaw; this involves an extensive number of treatment Options and is a complaint that carries with it a high risk of complications and morbidity. The differential includes insect bite, spider bite, cellulitis, parotitis, dental abscess  Additional history obtained from patient's wife Previous records obtained and reviewed in epic no recent admissions Social determinants considered, no significant barriers Critical Interventions: None  After the interventions stated above, I reevaluated the patient and found patient to be well-appearing in no distress Admission and further testing considered, no indications for admission or further workup at this time.  Will cover with antibiotics for possible cellulitis.  Do not feel a discrete abscess at this time and no signs of dental involvement.  Return instructions discussed         Final Clinical Impression(s) / ED Diagnoses Final diagnoses:  Facial  cellulitis    Rx / DC Orders ED Discharge Orders          Ordered    amoxicillin-clavulanate (AUGMENTIN) 875-125 MG tablet  Every 12 hours        08/29/22 1912              Hayden Rasmussen, MD 08/30/22 [id number]

## 2022-08-29 NOTE — Discharge Instructions (Signed)
You were seen in the emergency department for reddened area on your left jaw.  This is possibly an insect or spider bite.  We are prescribing you antibiotics for possible infection.  You can also use warm compress to the area.  Follow-up with your doctor.  Return to the emergency department if any worsening or concerning symptoms.

## 2022-08-29 NOTE — ED Triage Notes (Signed)
Pt arrives with c/o left side facial redness and firmness that's started a few days ago. Per pt, he thinks it could possibly be a spider bite.

## 2022-09-01 ENCOUNTER — Emergency Department (HOSPITAL_BASED_OUTPATIENT_CLINIC_OR_DEPARTMENT_OTHER)
Admission: EM | Admit: 2022-09-01 | Discharge: 2022-09-01 | Disposition: A | Discharge status: Home / Self Care | Payer: Medicare Other | Attending: Emergency Medicine | Admitting: Emergency Medicine

## 2022-09-01 ENCOUNTER — Other Ambulatory Visit: Payer: Self-pay

## 2022-09-01 ENCOUNTER — Encounter (HOSPITAL_BASED_OUTPATIENT_CLINIC_OR_DEPARTMENT_OTHER): Payer: Self-pay

## 2022-09-01 DIAGNOSIS — L03211 Cellulitis of face: Secondary | ICD-10-CM | POA: Insufficient documentation

## 2022-09-01 DIAGNOSIS — W57XXXA Bitten or stung by nonvenomous insect and other nonvenomous arthropods, initial encounter: Secondary | ICD-10-CM | POA: Diagnosis not present

## 2022-09-01 DIAGNOSIS — Z794 Long term (current) use of insulin: Secondary | ICD-10-CM | POA: Diagnosis not present

## 2022-09-01 MED ORDER — BACITRACIN ZINC 500 UNIT/GM EX OINT: TOPICAL_OINTMENT | Freq: Two times a day (BID) | CUTANEOUS | Status: DC

## 2022-09-01 MED ORDER — DOXYCYCLINE HYCLATE 100 MG PO TABS
100.0000 mg | ORAL_TABLET | Freq: Once | ORAL | Status: AC
  Administered 2022-09-01: 100 mg via ORAL
  Filled 2022-09-01: qty 1

## 2022-09-01 MED ORDER — DOXYCYCLINE HYCLATE 100 MG PO CAPS: 100.0000 mg | ORAL_CAPSULE | Freq: Two times a day (BID) | ORAL | 0 refills | Status: AC

## 2022-09-01 MED ORDER — BACITRACIN ZINC 500 UNIT/GM EX OINT: 1.0000 | TOPICAL_OINTMENT | Freq: Two times a day (BID) | CUTANEOUS | 0 refills | Status: DC

## 2022-09-01 NOTE — ED Triage Notes (Signed)
Pt has known infection on left side of face and has been on oral ATB since Tuesday. Mild swelling on lower left cheek with scabbing areas. Tender with palpation. Pt denies fever.

## 2022-09-01 NOTE — ED Provider Notes (Signed)
[place] EMERGENCY DEPARTMENT AT [facility] Provider Note   CSN: [id number] Arrival date & time: 09/01/22  1931     History  Chief Complaint  Patient presents with   Cellulitis    Tim Morgan is a 89 y.o. male.  Patient here for wound recheck of cellulitis spider bite on the left side of his face.  Has been on Augmentin.  He has been doing better.  No issues eating or drinking.  Denies any fevers or chills.  Has not been using any topical antibiotics.  The history is provided by the patient.       Home Medications Prior to Admission medications   Medication Sig Start Date End Date Taking? Authorizing Provider  bacitracin ointment Apply 1 Application topically 2 (two) times daily. 09/01/22  Yes Parish Augustine, DO  doxycycline (VIBRAMYCIN) 100 MG capsule Take 1 capsule (100 mg total) by mouth 2 (two) times daily for 7 days. 09/01/22 09/08/22 Yes Malic Rosten, DO  amLODipine-olmesartan (AZOR) 5-40 MG per tablet Take 1 tablet by mouth every morning.     [provider]  beclomethasone (QVAR) 80 MCG/ACT inhaler Inhale 2 puffs into the lungs 2 (two) times daily.    [provider]  budesonide-formoterol (SYMBICORT) 160-4.5 MCG/ACT inhaler Inhale 2 puffs into the lungs 2 (two) times daily.    [provider]  calcium citrate-vitamin D (CITRACAL+D) 315-200 MG-UNIT per tablet Take 1 tablet by mouth 2 (two) times daily.    [provider]  Chromium-Cinnamon (CINNAMON PLUS CHROMIUM PO) Take 2-3 capsules by mouth 2 (two) times daily.    [provider]  Coenzyme Q10 (COQ10) 100 MG CAPS Take 1 capsule by mouth daily.    [provider]  Dextromethorphan-Guaifenesin (MUCINEX DM MAXIMUM STRENGTH PO) Take 1 tablet by mouth daily as needed. For congestion    [provider]  diclofenac Sodium (VOLTAREN) 1 % GEL Apply 2 g topically 4 (four) times daily. 02/13/21   Couture, Cortni S, PA-C  docusate sodium (COLACE) 100 MG  capsule Take 100 mg by mouth at bedtime.    [provider]  esomeprazole (NEXIUM) 40 MG capsule Take 40 mg by mouth daily.      [provider]  famotidine (PEPCID) 20 MG tablet Take 20 mg by mouth at bedtime. Reported on 10/18/2015 07/09/14   [provider]  fluticasone (FLONASE) 50 MCG/ACT nasal spray Place 2 sprays into both nostrils daily.    [provider]  furosemide (LASIX) 20 MG tablet Take 20 mg by mouth.    [provider]  hydrOXYzine (ATARAX/VISTARIL) 25 MG tablet Take 25 mg by mouth 3 (three) times daily as needed. 06/13/14   [provider]  insulin aspart (NOVOLOG) 100 UNIT/ML FlexPen Inject 6 Units into the skin 3 (three) times daily with meals.    [provider]  insulin glargine (LANTUS) 100 UNIT/ML injection Inject 18 Units into the skin at bedtime.    [provider]  ipratropium-albuterol (DUONEB) 0.5-2.5 (3) MG/3ML SOLN Reported on 10/18/2015 05/03/14   [provider]  magnesium oxide (MAG-OX) 400 MG tablet Take 400 mg by mouth daily.    [provider]  metFORMIN (GLUMETZA) 500 MG (MOD) 24 hr tablet Take 500 mg by mouth 2 (two) times daily with a meal.    [provider]  methocarbamol (ROBAXIN) 500 MG tablet Take 500 mg by mouth 2 (two) times daily.     [provider]  Multiple Vitamin (  MULTIVITAMIN WITH MINERALS) TABS Take 1 tablet by mouth daily.    [provider]  Multiple Vitamins-Minerals (PRESERVISION AREDS) TABS Take 1 tablet by mouth daily.    [provider]  omeprazole (PRILOSEC) 20 MG capsule Take 20 mg by mouth daily.    [provider]  Potassium 99 MG TABS Take 99 mg by mouth daily.    [provider]  Respiratory Therapy Supplies (FLUTTER) DEVI Use as directed 10/03/14   Tanda Rockers, MD  rosuvastatin (CRESTOR) 40 MG tablet Take 40 mg by mouth at bedtime.    [provider]  sildenafil (VIAGRA) 100 MG  tablet Take 100 mg by mouth daily as needed for erectile dysfunction.    [provider]  tamsulosin (FLOMAX) 0.4 MG CAPS capsule Take 0.4 mg by mouth daily.    [provider]  terbinafine (LAMISIL) 250 MG tablet Take 250 mg by mouth daily.    [provider]  traMADol (ULTRAM) 50 MG tablet 1-2 every 4 hours as needed for cough or pain Patient not taking: Reported on 10/18/2015 09/09/14   Tanda Rockers, MD  triamcinolone cream (KENALOG) 0.1 % Apply 1 application topically daily as needed. For rash/ itching    [provider]      Allergies    Patient has no known allergies.    Review of Systems   Review of Systems  Physical Exam Updated Vital Signs BP 136/67   Pulse 61   Temp 97.7 F (36.5 C) (Oral)   Resp 16   Ht '5\' 10"'$  (1.778 m)   Wt 90.7 kg   SpO2 100%   BMI 28.70 kg/m  Physical Exam Vitals and nursing note reviewed.  Constitutional:      General: He is not in acute distress.    Appearance: He is well-developed. He is not ill-appearing.  HENT:     Head: Normocephalic and atraumatic.     Nose: Nose normal.     Mouth/Throat:     Mouth: Mucous membranes are moist.  Eyes:     Extraocular Movements: Extraocular movements intact.     Conjunctiva/sclera: Conjunctivae normal.     Pupils: Pupils are equal, round, and reactive to light.  Cardiovascular:     Rate and Rhythm: Normal rate and regular rhythm.     Pulses: Normal pulses.     Heart sounds: Normal heart sounds. No murmur heard. Pulmonary:     Effort: Pulmonary effort is normal. No respiratory distress.     Breath sounds: Normal breath sounds.  Abdominal:     General: Abdomen is flat.     Palpations: Abdomen is soft.     Tenderness: There is no abdominal tenderness.  Musculoskeletal:        General: No swelling.     Cervical back: Normal range of motion and neck supple.  Skin:    General: Skin is warm and dry.     Capillary Refill: Capillary refill takes less than 2  seconds.     Findings: Erythema present.     Comments: Little bit of redness over the left side of the face with some dried scale lesion  Neurological:     General: No focal deficit present.     Mental Status: He is alert.  Psychiatric:        Mood and Affect: Mood normal.     ED Results / Procedures / Treatments   Labs (all labs ordered are listed, but only abnormal results are displayed)  Labs Reviewed - No data to display  EKG None  Radiology No results found.  Procedures Procedures    Medications Ordered in ED Medications  bacitracin ointment (has no administration in time range)  doxycycline (VIBRA-TABS) tablet 100 mg (has no administration in time range)    ED Course/ Medical Decision Making/ A&P                             Medical Decision Making Risk OTC drugs. Prescription drug management.   Acquanetta Belling is here for recheck of facial cellulitis.  Seems like it is fairly stable but we will switch him over to doxycycline as it has been about 4 days since has been on antibiotic.  I do think looking at prior pictures that there is some improvement and no major extension.  There is no submandibular swelling.  He has no trismus.  This appears to be a superficial infectious process in the left side of his face.  Will also given bacitracin ointment to use as well.  Discharged in good condition.  Understands return precautions.  This chart was dictated using voice recognition software.  Despite best efforts to proofread,  errors can occur which can change the documentation meaning.         Final Clinical Impression(s) / ED Diagnoses Final diagnoses:  Cellulitis of face    Rx / DC Orders ED Discharge Orders          Ordered    doxycycline (VIBRAMYCIN) 100 MG capsule  2 times daily        09/01/22 1954    bacitracin ointment  2 times daily        09/01/22 1954              Lennice Sites, DO 09/01/22 1956

## 2022-09-05 ENCOUNTER — Ambulatory Visit: Payer: Medicare Other | Admitting: Family Medicine

## 2022-09-18 ENCOUNTER — Ambulatory Visit (INDEPENDENT_AMBULATORY_CARE_PROVIDER_SITE_OTHER): Payer: Medicare Other | Admitting: Family Medicine

## 2022-09-18 ENCOUNTER — Encounter: Payer: Self-pay | Admitting: Family Medicine

## 2022-09-18 VITALS — BP 128/84 | HR 75 | Temp 97.4°F | Ht 68.5 in | Wt 207.2 lb

## 2022-09-18 DIAGNOSIS — I878 Other specified disorders of veins: Secondary | ICD-10-CM

## 2022-09-18 DIAGNOSIS — Z794 Long term (current) use of insulin: Secondary | ICD-10-CM

## 2022-09-18 DIAGNOSIS — E119 Type 2 diabetes mellitus without complications: Secondary | ICD-10-CM | POA: Diagnosis not present

## 2022-09-18 DIAGNOSIS — L03211 Cellulitis of face: Secondary | ICD-10-CM

## 2022-09-18 NOTE — Progress Notes (Addendum)
Assessment/Plan:  Total time spent caring for the patient today was 70 minutes. This includes time spent before the visit reviewing the chart, time spent during the visit, and time spent after the visit on documentation, etc.  Problem List Items Addressed This Visit       Cardiovascular and Mediastinum   Venous stasis of both lower extremities - Primary    Differential diagnosis: Chronic venous insufficiency, deep vein thrombosis (ruled out by previous ultrasound), lymphedema.  Continue current medication regimen and monitor for blood pressure and renal function due to diuretic therapy. Ambulatory referral to Vascular Surgery for further evaluation of venous stasis and potential interventions to manage symptoms and improve quality of life. Explore more tolerable compression options and techniques for leg elevation to manage edema and discomfort. Schedule follow-up in 3 months or earlier if symptoms worsen significantly.      Relevant Medications   losartan (COZAAR) 50 MG tablet     Endocrine   Type 2 diabetes mellitus without complication, with long-term current use of insulin (HCC)   Relevant Medications   TRULICITY [id number] 0000000 SOPN   losartan (COZAAR) 50 MG tablet     Other   Cellulitis of face    Continue wound care with topical antibiotic ointment and monitor for signs of infection. Revisit in case of worsening symptoms or if the area does not continue to improve.       Medications Discontinued During This Encounter  Medication Reason   amLODipine-olmesartan (AZOR) 5-40 MG per tablet    Dextromethorphan-Guaifenesin (MUCINEX DM MAXIMUM STRENGTH PO)    diclofenac Sodium (VOLTAREN) 1 % GEL    traMADol (ULTRAM) 50 MG tablet     Return in about 3 months (around 12/19/2022) for left swelling.    Subjective:   Encounter date: 09/18/2022  Tim Morgan is a 89 y.o. male who has Acute medial meniscal tear; Chronic sinusitis/chronic cough ; Mild persistent asthma in  adult without complication; Essential hypertension; Upper airway cough syndrome; Venous stasis of both lower extremities; Cellulitis of face; and Type 2 diabetes mellitus without complication, with long-term current use of insulin (HCC) on their problem list..   He  has a past medical history of Acute bronchitis, COPD (chronic obstructive pulmonary disease) ([place]), Diabetes mellitus, Edema of left lower extremity (07/03/2012), GERD (gastroesophageal reflux disease), History of back injury (07/01/1988), Hypertension (12/06/2011), Left ventricular hypertrophy, Melanoma ([place]), and Vitamin D deficiency..   History of Present Illness:  Venous stasis of lower extremity - Mr. Tim Morgan, an 89 year old male, presents for establishing care with a primary complaint of left leg pain persisting for 2 years, associated with worsening edema in the left lower extremity, more significant than the right. He reports a history of various interventions with limited success, including cortisone shots in his lower back for sciatica, diuretics, and compression stockings( which he finds uncomfortable and unable to keep them up).  The pain does lead to increased difficulty with ambulation at times and he is concerned about a fall.  The patient indicates that elevating the leg and minimizing movement can reduce pain temporarily.  Cellulitis of face - Additionally, he reports a recent episode of cellulitis on the left cheek, believed to be from a spider bite, which was treated with Augmentin followed by Doxycycline and Bacitracin ointment, showing improvement.  Review of Systems  Constitutional:  Negative for chills, diaphoresis, fever, malaise/fatigue and weight loss.  HENT:  Negative for congestion, ear discharge, ear pain and hearing loss.  Eyes:  Negative for blurred vision, double vision, photophobia, pain, discharge and redness.  Respiratory:  Negative for cough, sputum production, shortness of breath and wheezing.    Cardiovascular:  Positive for leg swelling (left worse than right). Negative for chest pain and palpitations.  Gastrointestinal:  Negative for abdominal pain, blood in stool, constipation, diarrhea, heartburn, melena, nausea and vomiting.  Genitourinary:  Negative for dysuria, flank pain, frequency, hematuria and urgency.  Musculoskeletal:  Negative for myalgias.  Skin:  Negative for itching and rash.  Neurological:  Negative for dizziness, tingling, tremors, speech change, seizures, loss of consciousness, weakness and headaches.  Psychiatric/Behavioral:  Negative for depression, hallucinations, memory loss, substance abuse and suicidal ideas. The patient does not have insomnia.     Past Surgical History:  Procedure Laterality Date   ANKLE SURGERY Left 07/01/1988   APPENDECTOMY  age 61   BRAIN SURGERY  07/01/1993   concusion   HERNIA REPAIR     x 2   KNEE ARTHROSCOPY Left 11/18/2012   Procedure: LEFT ARTHROSCOPY KNEE WITH DEBRIDEMENT AND CHRONDROLPLASTY;  Surgeon: Gearlean Alf, MD;  Location: [facility];  Service: Orthopedics;  Laterality: Left;   KNEE SURGERY     NASAL SINUS SURGERY Bilateral    to open up ear tubes    Outpatient Medications Prior to Visit  Medication Sig Dispense Refill   Ascorbic Acid (VITAMIN C) 1000 MG tablet Take 1,000 mg by mouth.     bacitracin ointment Apply 1 Application topically 2 (two) times daily. 120 g 0   beclomethasone (QVAR) 80 MCG/ACT inhaler Inhale 2 puffs into the lungs 2 (two) times daily.     Biotin 5000 MCG CAPS Take 1 capsule by mouth daily.     budesonide-formoterol (SYMBICORT) 160-4.5 MCG/ACT inhaler Inhale 2 puffs into the lungs 2 (two) times daily.     calcium citrate-vitamin D (CITRACAL+D) 315-200 MG-UNIT per tablet Take 1 tablet by mouth 2 (two) times daily.     Chromium-Cinnamon (CINNAMON PLUS CHROMIUM PO) Take 2-3 capsules by mouth 2 (two) times daily.     Coenzyme Q10 (COQ10) 100 MG CAPS Take 1 capsule by mouth daily.      cyanocobalamin (VITAMIN B12) 1000 MCG tablet Take 1,000 mcg by mouth daily.     docusate sodium (COLACE) 100 MG capsule Take 100 mg by mouth at bedtime.     Ergocalciferol 50 MCG (2000 UT) CAPS Take 50 mcg by mouth once a week.     esomeprazole (NEXIUM) 40 MG capsule Take 40 mg by mouth daily.       famotidine (PEPCID) 20 MG tablet Take 20 mg by mouth at bedtime. Reported on 10/18/2015  2   ferrous sulfate 325 (65 FE) MG tablet Take 1 tablet by mouth daily.     fluticasone (FLONASE) 50 MCG/ACT nasal spray Place 2 sprays into both nostrils daily.     furosemide (LASIX) 20 MG tablet Take 20 mg by mouth.     hydrOXYzine (ATARAX/VISTARIL) 25 MG tablet Take 25 mg by mouth 3 (three) times daily as needed.  1   insulin aspart (NOVOLOG) 100 UNIT/ML FlexPen Inject 6 Units into the skin 3 (three) times daily with meals.     insulin glargine (LANTUS) 100 UNIT/ML injection Inject 18 Units into the skin at bedtime.     ipratropium-albuterol (DUONEB) 0.5-2.5 (3) MG/3ML SOLN Reported on 10/18/2015  3   losartan (COZAAR) 50 MG tablet      magnesium oxide (MAG-OX) 400 MG tablet Take 400 mg  by mouth daily.     metFORMIN (GLUMETZA) 500 MG (MOD) 24 hr tablet Take 500 mg by mouth 2 (two) times daily with a meal.     methocarbamol (ROBAXIN) 500 MG tablet Take 500 mg by mouth 2 (two) times daily.      Multiple Vitamin (MULTIVITAMIN WITH MINERALS) TABS Take 1 tablet by mouth daily.     Multiple Vitamins-Minerals (PRESERVISION AREDS) TABS Take 1 tablet by mouth daily.     omeprazole (PRILOSEC) 20 MG capsule Take 20 mg by mouth daily.     Potassium 99 MG TABS Take 99 mg by mouth daily.     Respiratory Therapy Supplies (FLUTTER) DEVI Use as directed 1 each 0   rosuvastatin (CRESTOR) 40 MG tablet Take 40 mg by mouth at bedtime.     sildenafil (VIAGRA) 100 MG tablet Take 100 mg by mouth daily as needed for erectile dysfunction.     tamsulosin (FLOMAX) 0.4 MG CAPS capsule Take 0.4 mg by mouth daily.     terbinafine  (LAMISIL) 250 MG tablet Take 250 mg by mouth daily.     triamcinolone cream (KENALOG) 0.1 % Apply 1 application topically daily as needed. For rash/ itching     TRULICITY A999333 0000000 SOPN      amLODipine-olmesartan (AZOR) 5-40 MG per tablet Take 1 tablet by mouth every morning.  (Patient not taking: Reported on 09/18/2022)     Dextromethorphan-Guaifenesin (MUCINEX DM MAXIMUM STRENGTH PO) Take 1 tablet by mouth daily as needed. For congestion (Patient not taking: Reported on 09/18/2022)     diclofenac Sodium (VOLTAREN) 1 % GEL Apply 2 g topically 4 (four) times daily. (Patient not taking: Reported on 09/18/2022) 100 g 0   traMADol (ULTRAM) 50 MG tablet 1-2 every 4 hours as needed for cough or pain (Patient not taking: Reported on 10/18/2015) 40 tablet 0   No facility-administered medications prior to visit.    History reviewed. No pertinent family history.  Social History   Socioeconomic History   Marital status: Married    Spouse name: Not on file   Number of children: Not on file   Years of education: Not on file   Highest education level: Not on file  Occupational History   Occupation: Retired [employer]  Tobacco Use   Smoking status: Never   Smokeless tobacco: Never  Vaping Use   Vaping Use: Never used  Substance and Sexual Activity   Alcohol use: No   Drug use: No   Sexual activity: Not on file  Other Topics Concern   Not on file  Social History Narrative   Not on file   Social Determinants of Health   Financial Resource Strain: Not on file  Food Insecurity: Not on file  Transportation Needs: Not on file  Physical Activity: Not on file  Stress: Not on file  Social Connections: Not on file  Intimate Partner Violence: Not on file                                                                                                  Objective:  Physical Exam: BP 128/84 (BP Location: Right Arm, Patient Position: Sitting, Cuff Size: Large)   Pulse 75   Temp (!) 97.4 F  (36.3 C) (Temporal)   Ht 5' 8.5" (1.74 m)   Wt 207 lb 3.2 oz (94 kg)   SpO2 99%   BMI 31.05 kg/m     Physical Exam Constitutional:      Appearance: Normal appearance.  HENT:     Head: Normocephalic and atraumatic.     Right Ear: Hearing normal.     Left Ear: Hearing normal.     Nose: Nose normal.  Eyes:     General: No scleral icterus.       Right eye: No discharge.        Left eye: No discharge.     Extraocular Movements: Extraocular movements intact.  Cardiovascular:     Rate and Rhythm: Normal rate and regular rhythm.     Heart sounds: Normal heart sounds.  Pulmonary:     Effort: Pulmonary effort is normal.     Breath sounds: Normal breath sounds.  Abdominal:     Palpations: Abdomen is soft.     Tenderness: There is no abdominal tenderness.  Musculoskeletal:     Right lower leg: 2+ Pitting Edema present.     Left lower leg: 3+ Pitting Edema present.  Skin:    General: Skin is warm.     Findings: Wound (well healing cellulitis on left cheek) present. No rash.     Comments: Dry skin with scaling bilateral lower extremities without any ulceration  Neurological:     General: No focal deficit present.     Mental Status: He is alert.     Cranial Nerves: No cranial nerve deficit.  Psychiatric:        Mood and Affect: Mood normal.        Behavior: Behavior normal.        Thought Content: Thought content normal.        Judgment: Judgment normal.    Narrative & Impression  CLINICAL DATA:  Lower extremity edema   EXAM: RIGHT LOWER EXTREMITY VENOUS DOPPLER ULTRASOUND   TECHNIQUE: Gray-scale sonography with compression, as well as color and duplex ultrasound, were performed to evaluate the deep venous system(s) from the level of the common femoral vein through the popliteal and proximal calf veins.   COMPARISON:  None.   FINDINGS: VENOUS   Normal compressibility of the common femoral, superficial femoral, and popliteal veins, as well as the visualized calf  veins. Visualized portions of profunda femoral vein and great saphenous vein unremarkable. No filling defects to suggest DVT on grayscale or color Doppler imaging. Doppler waveforms show normal direction of venous flow, normal respiratory plasticity and response to augmentation.   Limited views of the contralateral common femoral vein are unremarkable.   OTHER   None.   Limitations: none   IMPRESSION: Negative.     Electronically Signed   By: Ulyses Jarred M.D.   On: 02/13/2021 20:22   CLINICAL DATA:  89 year old male with pain beginning in the distal posterior thigh and extending into the proximal calf for 1 week.   EXAM: RIGHT LOWER EXTREMITY VENOUS DOPPLER ULTRASOUND   TECHNIQUE: Gray-scale sonography with graded compression, as well as color Doppler and duplex ultrasound were performed to evaluate the lower extremity deep venous systems from the level of the common femoral vein and including the common femoral, femoral, profunda femoral, popliteal and calf veins including the posterior tibial, peroneal and gastrocnemius  veins when visible. The superficial great saphenous vein was also interrogated. Spectral Doppler was utilized to evaluate flow at rest and with distal augmentation maneuvers in the common femoral, femoral and popliteal veins.   COMPARISON:  None.   FINDINGS: Contralateral Common Femoral Vein: Respiratory phasicity is normal and symmetric with the symptomatic side. No evidence of thrombus. Normal compressibility.   Common Femoral Vein: No evidence of thrombus. Normal compressibility, respiratory phasicity and response to augmentation.   Saphenofemoral Junction: No evidence of thrombus. Normal compressibility and flow on color Doppler imaging.   Profunda Femoral Vein: No evidence of thrombus. Normal compressibility and flow on color Doppler imaging.   Femoral Vein: No evidence of thrombus. Normal compressibility, respiratory phasicity and  response to augmentation.   Popliteal Vein: No evidence of thrombus. Normal compressibility, respiratory phasicity and response to augmentation.   Calf Veins: No evidence of thrombus. Normal compressibility and flow on color Doppler imaging.   Superficial Great Saphenous Vein: No evidence of thrombus. Normal compressibility.   Venous Reflux:  None.   Other Findings: Subcutaneous edema is noted in the region of clinical concern.   IMPRESSION: 1. No evidence of deep or superficial venous thrombosis. 2. There is localized subcutaneous edema along the posterior aspect of the calf. This could represent the sequelae of a recently ruptured Baker's cyst.     Electronically Signed   By: Jacqulynn Cadet M.D.   On: 09/14/2019 13:55  No results found.  No results found for this or any previous visit (from the past 2160 hour(s)).      Alesia Banda, MD, MS

## 2022-09-18 NOTE — Assessment & Plan Note (Signed)
Differential diagnosis: Chronic venous insufficiency, deep vein thrombosis (ruled out by previous ultrasound), lymphedema.  Continue current medication regimen and monitor for blood pressure and renal function due to diuretic therapy. Ambulatory referral to Vascular Surgery for further evaluation of venous stasis and potential interventions to manage symptoms and improve quality of life. Explore more tolerable compression options and techniques for leg elevation to manage edema and discomfort. Schedule follow-up in 3 months or earlier if symptoms worsen significantly.

## 2022-09-18 NOTE — Patient Instructions (Signed)
For lower extremity swelling, please continue elevation, you may try stockings given for improvement.  We are referring to vascular surgery to discuss alternative options.

## 2022-09-18 NOTE — Assessment & Plan Note (Signed)
Continue wound care with topical antibiotic ointment and monitor for signs of infection. Revisit in case of worsening symptoms or if the area does not continue to improve.

## 2022-09-20 ENCOUNTER — Telehealth: Payer: Self-pay

## 2022-09-20 NOTE — Telephone Encounter (Signed)
Received [facility]ill [facility] for [facility] medications via fax, but the sig is missing from the medications. I've listed the medications below. Please advise.  Rosuvastatin tabs 40 mg Furosemide tabs 40 mg Baclofen tabs 10 mg Losartan tabs 50 mg  Omeprazole DR CAPS 20 MG Tamsulosin HCL caps 0.4 mg Vitamin D-3 tabs 2,000 U

## 2022-09-26 ENCOUNTER — Other Ambulatory Visit (INDEPENDENT_AMBULATORY_CARE_PROVIDER_SITE_OTHER): Payer: Medicare Other | Admitting: Family

## 2022-09-26 DIAGNOSIS — I10 Essential (primary) hypertension: Secondary | ICD-10-CM

## 2022-09-26 DIAGNOSIS — E119 Type 2 diabetes mellitus without complications: Secondary | ICD-10-CM | POA: Diagnosis not present

## 2022-09-26 DIAGNOSIS — Z794 Long term (current) use of insulin: Secondary | ICD-10-CM

## 2022-09-26 MED ORDER — LOSARTAN POTASSIUM 50 MG PO TABS: ORAL_TABLET | ORAL | 1 refills | Status: DC

## 2022-09-26 MED ORDER — TAMSULOSIN HCL 0.4 MG PO CAPS: 0.4000 mg | ORAL_CAPSULE | Freq: Every day | ORAL | 1 refills | Status: DC

## 2022-09-26 MED ORDER — FUROSEMIDE 20 MG PO TABS: 20.0000 mg | ORAL_TABLET | Freq: Every day | ORAL | 1 refills | Status: DC

## 2022-09-26 MED ORDER — OMEPRAZOLE 20 MG PO CPDR: 20.0000 mg | DELAYED_RELEASE_CAPSULE | Freq: Every day | ORAL | 1 refills | Status: DC

## 2022-09-26 MED ORDER — ROSUVASTATIN CALCIUM 40 MG PO TABS: 40.0000 mg | ORAL_TABLET | Freq: Every day | ORAL | 1 refills | Status: DC

## 2022-10-01 ENCOUNTER — Telehealth: Payer: Self-pay | Admitting: Family

## 2022-10-01 NOTE — Telephone Encounter (Signed)
Called left vm for pt to call office to verify his PCP.

## 2022-10-01 NOTE — Telephone Encounter (Signed)
Encourage patient to contact the pharmacy for refills or they can request refills through [place] left a message with Ms Smet because she didn't give me the address. She has canceled the scripts that went to [facility].  WHAT PHARMACY WOULD THEY LIKE THIS SENT TO: [facility]  MEDICATION NAME & DOSE:  Disp Refills Start End  losartan (COZAAR) 50 MG tablet 90 tablet 1 09/26/2022   Take 1 tab po daily  tamsulosin (FLOMAX) 0.4 MG CAPS capsule 90 capsule 1 09/26/2022   Take 1 capsule (0.4 mg total) by mouth daily. - Oral  omeprazole (PRILOSEC) 20 MG capsule 90 capsule 1 09/26/2022   Take 1 capsule (20 mg total) by mouth daily. - Oral  rosuvastatin (CRESTOR) 40 MG tablet 90 tablet 1 09/26/2022   Take 1 tablet (40 mg total) by mouth at bedtime. - Oral  furosemide (LASIX) 20 MG tablet 90 tablet 1 09/26/2022   Take 1 tablet (20 mg total) by mouth daily. - Oral    NOTES/COMMENTS FROM PATIENT:      [place] office please notify patient: It takes 48-72 hours to process rx refill requests Ask patient to call pharmacy to ensure rx is ready before heading there.

## 2022-10-02 ENCOUNTER — Other Ambulatory Visit: Payer: Self-pay | Admitting: Family

## 2022-10-02 MED ORDER — FUROSEMIDE 20 MG PO TABS: 20.0000 mg | ORAL_TABLET | Freq: Every day | ORAL | 1 refills | Status: DC

## 2022-10-02 MED ORDER — OMEPRAZOLE 20 MG PO CPDR: 20.0000 mg | DELAYED_RELEASE_CAPSULE | Freq: Every day | ORAL | 1 refills | Status: DC

## 2022-10-02 MED ORDER — TAMSULOSIN HCL 0.4 MG PO CAPS: 0.4000 mg | ORAL_CAPSULE | Freq: Every day | ORAL | 1 refills | Status: DC

## 2022-10-02 MED ORDER — LOSARTAN POTASSIUM 50 MG PO TABS: ORAL_TABLET | ORAL | 1 refills | Status: DC

## 2022-10-02 MED ORDER — ROSUVASTATIN CALCIUM 40 MG PO TABS: 40.0000 mg | ORAL_TABLET | Freq: Every day | ORAL | 1 refills | Status: DC

## 2022-10-04 ENCOUNTER — Other Ambulatory Visit: Payer: Self-pay | Admitting: *Deleted

## 2022-10-04 DIAGNOSIS — I878 Other specified disorders of veins: Secondary | ICD-10-CM

## 2022-10-15 ENCOUNTER — Encounter: Payer: Self-pay | Admitting: Vascular Surgery

## 2022-10-15 ENCOUNTER — Ambulatory Visit (HOSPITAL_COMMUNITY)
Admission: RE | Admit: 2022-10-15 | Discharge: 2022-10-15 | Disposition: A | Discharge status: Home / Self Care | Payer: Medicare Other | Source: Ambulatory Visit | Attending: Vascular Surgery | Admitting: Vascular Surgery

## 2022-10-15 ENCOUNTER — Ambulatory Visit (INDEPENDENT_AMBULATORY_CARE_PROVIDER_SITE_OTHER): Payer: Medicare Other | Admitting: Vascular Surgery

## 2022-10-15 VITALS — BP 121/70 | HR 65 | Temp 97.7°F | Resp 18 | Ht 69.0 in | Wt 206.0 lb

## 2022-10-15 DIAGNOSIS — I878 Other specified disorders of veins: Secondary | ICD-10-CM | POA: Diagnosis present

## 2022-10-15 NOTE — Progress Notes (Signed)
Patient name: Tim Morgan MRN: [id number] DOB: 06-26-1933 Sex: male  REASON FOR CONSULT: Venous stasis of lower extremities   HPI: Tim DEFINO is a 89 y.o. male, with hx COPD, DM, HTN, melanoma presents for evaluation of venous stasis of his lower extremities.  He is more concerned with left lower extremity swelling.  He states this has been ongoing for at least 3 years.  He also has had swelling of the left knee.  Sounds like he had multiple effusions drained with his orthopedic surgeon in the office from his left knee.  He has tried wearing thigh-high stockings that he did not feel worked well.  No previous venous interventions.  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  Past Medical History:  Diagnosis Date   Acute bronchitis    COPD (chronic obstructive pulmonary disease)    Diabetes mellitus    Edema of left lower extremity 07/03/2012   venous doppler-no evidence of thrombus or thrombophlebitis,left gsv valvular insufficency throughout the vein,left SSV appeared patent wiyh no venous insuffiency, left popliteal area questionable rupture Baker's cyst this was considered a abnormal doppler   GERD (gastroesophageal reflux disease)    History of back injury 07/01/1988   L 4 to , no surgery healed on own   Hypertension 12/06/2011   echo-EF 65-70%    Left ventricular hypertrophy    Melanoma    Vitamin D deficiency     Past Surgical History:  Procedure Laterality Date   ANKLE SURGERY Left 07/01/1988   APPENDECTOMY  age 22   BRAIN SURGERY  07/01/1993   concusion   HERNIA REPAIR     x 2   KNEE ARTHROSCOPY Left 11/18/2012   Procedure: LEFT ARTHROSCOPY KNEE WITH DEBRIDEMENT AND CHRONDROLPLASTY;  Surgeon: Loanne Drilling, MD;  Location: [facility];  Service: Orthopedics;  Laterality: Left;   KNEE SURGERY     NASAL SINUS SURGERY Bilateral    to open up ear tubes    History reviewed. No pertinent family history.  SOCIAL HISTORY: Social History    Socioeconomic History   Marital status: Married    Spouse name: Not on file   Number of children: Not on file   Years of education: Not on file   Highest education level: Not on file  Occupational History   Occupation: Retired [employer]  Tobacco Use   Smoking status: Never   Smokeless tobacco: Never  Vaping Use   Vaping Use: Never used  Substance and Sexual Activity   Alcohol use: No   Drug use: No   Sexual activity: Not on file  Other Topics Concern   Not on file  Social History Narrative   Not on file   Social Determinants of Health   Financial Resource Strain: Not on file  Food Insecurity: Not on file  Transportation Needs: Not on file  Physical Activity: Not on file  Stress: Not on file  Social Connections: Not on file  Intimate Partner Violence: Not on file    Allergies  Allergen Reactions   Sitagliptin Hives   Wound Dressing Adhesive Rash   Tape Rash    Current Outpatient Medications  Medication Sig Dispense Refill   Ascorbic Acid (VITAMIN C) 1000 MG tablet Take 1,000 mg by mouth.     bacitracin ointment Apply 1 Application topically 2 (two) times daily. 120 g 0   beclomethasone (QVAR) 80 MCG/ACT inhaler Inhale 2 puffs into the lungs 2 (two) times  daily.     Biotin 5000 MCG CAPS Take 1 capsule by mouth daily.     budesonide-formoterol (SYMBICORT) 160-4.5 MCG/ACT inhaler Inhale 2 puffs into the lungs 2 (two) times daily.     calcium citrate-vitamin D (CITRACAL+D) 315-200 MG-UNIT per tablet Take 1 tablet by mouth 2 (two) times daily.     Chromium-Cinnamon (CINNAMON PLUS CHROMIUM PO) Take 2-3 capsules by mouth 2 (two) times daily.     Coenzyme Q10 (COQ10) 100 MG CAPS Take 1 capsule by mouth daily.     cyanocobalamin (VITAMIN B12) 1000 MCG tablet Take 1,000 mcg by mouth daily.     docusate sodium (COLACE) 100 MG capsule Take 100 mg by mouth at bedtime.     Ergocalciferol 50 MCG (2000 UT) CAPS Take 50 mcg by mouth once a week.     esomeprazole (NEXIUM) 40  MG capsule Take 40 mg by mouth daily.       famotidine (PEPCID) 20 MG tablet Take 20 mg by mouth at bedtime. Reported on 10/18/2015  2   ferrous sulfate 325 (65 FE) MG tablet Take 1 tablet by mouth daily.     fluticasone (FLONASE) 50 MCG/ACT nasal spray Place 2 sprays into both nostrils daily.     furosemide (LASIX) 20 MG tablet Take 1 tablet (20 mg total) by mouth daily. 90 tablet 1   hydrOXYzine (ATARAX/VISTARIL) 25 MG tablet Take 25 mg by mouth 3 (three) times daily as needed.  1   insulin aspart (NOVOLOG) 100 UNIT/ML FlexPen Inject 6 Units into the skin 3 (three) times daily with meals.     insulin glargine (LANTUS) 100 UNIT/ML injection Inject 18 Units into the skin at bedtime.     ipratropium-albuterol (DUONEB) 0.5-2.5 (3) MG/3ML SOLN Reported on 10/18/2015  3   losartan (COZAAR) 50 MG tablet Take 1 tab po daily 90 tablet 1   magnesium oxide (MAG-OX) 400 MG tablet Take 400 mg by mouth daily.     metFORMIN (GLUMETZA) 500 MG (MOD) 24 hr tablet Take 500 mg by mouth 2 (two) times daily with a meal.     methocarbamol (ROBAXIN) 500 MG tablet Take 500 mg by mouth 2 (two) times daily.      Multiple Vitamin (MULTIVITAMIN WITH MINERALS) TABS Take 1 tablet by mouth daily.     Multiple Vitamins-Minerals (PRESERVISION AREDS) TABS Take 1 tablet by mouth daily.     omeprazole (PRILOSEC) 20 MG capsule Take 1 capsule (20 mg total) by mouth daily. 90 capsule 1   Potassium 99 MG TABS Take 99 mg by mouth daily.     Respiratory Therapy Supplies (FLUTTER) DEVI Use as directed 1 each 0   rosuvastatin (CRESTOR) 40 MG tablet Take 1 tablet (40 mg total) by mouth at bedtime. 90 tablet 1   sildenafil (VIAGRA) 100 MG tablet Take 100 mg by mouth daily as needed for erectile dysfunction.     tamsulosin (FLOMAX) 0.4 MG CAPS capsule Take 1 capsule (0.4 mg total) by mouth daily. 90 capsule 1   terbinafine (LAMISIL) 250 MG tablet Take 250 mg by mouth daily.     triamcinolone cream (KENALOG) 0.1 % Apply 1 application  topically daily as needed. For rash/ itching     TRULICITY 0.75 MG/0.5ML SOPN      No current facility-administered medications for this visit.    REVIEW OF SYSTEMS:  [X]  denotes positive finding, [ ]  denotes negative finding Cardiac  Comments:  Chest pain or chest pressure:    Shortness of breath  upon exertion:    Short of breath when lying flat:    Irregular heart rhythm:        Vascular    Pain in calf, thigh, or hip brought on by ambulation:    Pain in feet at night that wakes you up from your sleep:     Blood clot in your veins:    Leg swelling:  x       Pulmonary    Oxygen at home:    Productive cough:     Wheezing:         Neurologic    Sudden weakness in arms or legs:     Sudden numbness in arms or legs:     Sudden onset of difficulty speaking or slurred speech:    Temporary loss of vision in one eye:     Problems with dizziness:         Gastrointestinal    Blood in stool:     Vomited blood:         Genitourinary    Burning when urinating:     Blood in urine:        Psychiatric    Major depression:         Hematologic    Bleeding problems:    Problems with blood clotting too easily:        Skin    Rashes or ulcers:        Constitutional    Fever or chills:      PHYSICAL EXAM: Vitals:   10/15/22 1146  BP: 121/70  Pulse: 65  Resp: 18  Temp: 97.7 F (36.5 C)  TempSrc: Temporal  SpO2: 94%  Weight: 206 lb (93.4 kg)  Height:  (1.753 m)    GENERAL: The patient is a well-nourished male, in no acute distress. The vital signs are documented above. CARDIAC: There is a regular rate and rhythm.  VASCULAR:  Palpable femoral pulses bilaterally Palpable right DP palpable Left pedal pulses nonpalpable Notable venous stasis changes bilaterally with skin thickening  PULMONARY: No respiratory distress. ABDOMEN: Soft and non-tender. MUSCULOSKELETAL: There are no major deformities or cyanosis. NEUROLOGIC: No focal weakness or paresthesias are  detected. PSYCHIATRIC: The patient has a normal affect.  DATA:   Lower Venous Reflux Study   Patient Name:  MACKIE HOLNESS  Date of Exam:   10/15/2022  Medical Rec #: [id number]       Accession #:    [id number]  Date of Birth: 1932/11/11       Patient Gender: M  Patient Age:   15 years  Exam Location:  [facility]  Procedure:      VAS [place] LOWER EXTREMITY VENOUS REFLUX  Referring Phys: Sherald Hess    ---------------------------------------------------------------------------  -----    Indications: Swelling, and Edema.    Risk Factors: Immobility.  Comparison Study: 02/13/21 LT LE DVT study - Negative                    10/19/19 RT LE DVT study - Negative                    07/03/12 LT LE reflux study   Performing Technologist: Lowell Guitar RVT, RDMS     Examination Guidelines: A complete evaluation includes B-mode imaging,  spectral  Doppler, color Doppler, and power Doppler as needed of all accessible  portions  of each vessel. Bilateral testing is considered an integral part of a  complete  examination. Limited examinations for reoccurring indications may be  performed  as noted. The reflux portion of the exam is performed with the patient in  reverse Trendelenburg.  Significant venous reflux is defined as >500 ms in the superficial venous  system, and >1 second in the deep venous system.     +--------------+---------+------+-----------+------------+--------+  LEFT         Reflux NoRefluxReflux TimeDiameter cmsComments                          Yes                                   +--------------+---------+------+-----------+------------+--------+  CFV          no                                              +--------------+---------+------+-----------+------------+--------+  FV prox       no                                              +--------------+---------+------+-----------+------------+--------+  FV mid        no                                               +--------------+---------+------+-----------+------------+--------+  FV dist       no                                              +--------------+---------+------+-----------+------------+--------+  Popliteal              yes   >1 second                       +--------------+---------+------+-----------+------------+--------+  GSV at SFJ              yes    >500 ms      0.87              +--------------+---------+------+-----------+------------+--------+  GSV prox thigh          yes    >500 ms      0.41              +--------------+---------+------+-----------+------------+--------+  GSV mid thigh           yes    >500 ms      0.29              +--------------+---------+------+-----------+------------+--------+  GSV dist thigh          yes    >500 ms      0.24              +--------------+---------+------+-----------+------------+--------+  GSV at knee   no                            0.20              +--------------+---------+------+-----------+------------+--------+  GSV prox calf           yes    >500 ms      0.32              +--------------+---------+------+-----------+------------+--------+  SSV Pop Fossa no                            0.42              +--------------+---------+------+-----------+------------+--------+  SSV prox calf no                            0.20              +--------------+---------+------+-----------+------------+--------+         Summary:  Left:  - No evidence of deep vein thrombosis seen in the left lower extremity,  from the common femoral through the popliteal veins.  - No evidence of superficial venous thrombosis in the left lower  extremity.    - No evidence of superficial venous reflux seen in the left short  saphenous vein.    - Venous reflux is noted in the left sapheno-femoral junction.  - Venous reflux is noted in the  left greater saphenous vein in the thigh.  - Venous reflux is noted in the left greater saphenous vein in the calf.  - Venous reflux is noted in the left popliteal vein.    *See table(s) above for measurements and observations.   Assessment/Plan:  89 year old male presents with evidence of chronic venous insufficiency worse in the left lower extremity consistent with CEAP classification C4.  Discussed his reflux study shows evidence of venous reflux in the deep and superficial venous systems in the left leg.  Discussed this would explain the swelling around his ankle with the discoloration.  I think he has a separate problem with multiple joint effusions in the knee that have required arthrocentesis with his orthopedic surgeon.  I discussed the management for venous insufficiency which involved leg elevation, exercise, weight loss and compression stockings.  We did get him sized for knee-high compression stockings today as he had trouble wearing his thigh high stockings at home.  I discussed he can wear these daily and elevate his legs with pillows in the recliner.  He is very active outdoors working in the yard.  He asked if there is anything else can be done and I discussed laser ablation of his great saphenous vein as this appears to be his dominant refluxing segment.  I will bring him back in 3 months to see one of my partners for further evaluation to see if they feel he would be a candidate for any further intervention.   Cephus Shelling, MD [facility] Office: [phone]

## 2022-11-11 ENCOUNTER — Other Ambulatory Visit: Payer: Self-pay

## 2022-11-11 ENCOUNTER — Emergency Department (HOSPITAL_COMMUNITY): Payer: Medicare Other

## 2022-11-11 ENCOUNTER — Emergency Department (HOSPITAL_COMMUNITY)
Admission: EM | Admit: 2022-11-11 | Discharge: 2022-11-12 | Disposition: A | Discharge status: Home / Self Care | Payer: Medicare Other | Attending: Student | Admitting: Student

## 2022-11-11 ENCOUNTER — Encounter (HOSPITAL_COMMUNITY): Payer: Self-pay | Admitting: Emergency Medicine

## 2022-11-11 DIAGNOSIS — E119 Type 2 diabetes mellitus without complications: Secondary | ICD-10-CM | POA: Insufficient documentation

## 2022-11-11 DIAGNOSIS — W19XXXA Unspecified fall, initial encounter: Secondary | ICD-10-CM

## 2022-11-11 DIAGNOSIS — Y92007 Garden or yard of unspecified non-institutional (private) residence as the place of occurrence of the external cause: Secondary | ICD-10-CM | POA: Diagnosis not present

## 2022-11-11 DIAGNOSIS — W01198A Fall on same level from slipping, tripping and stumbling with subsequent striking against other object, initial encounter: Secondary | ICD-10-CM | POA: Insufficient documentation

## 2022-11-11 DIAGNOSIS — S0990XA Unspecified injury of head, initial encounter: Secondary | ICD-10-CM | POA: Diagnosis present

## 2022-11-11 DIAGNOSIS — M25511 Pain in right shoulder: Secondary | ICD-10-CM | POA: Insufficient documentation

## 2022-11-11 DIAGNOSIS — J449 Chronic obstructive pulmonary disease, unspecified: Secondary | ICD-10-CM | POA: Diagnosis not present

## 2022-11-11 DIAGNOSIS — R0781 Pleurodynia: Secondary | ICD-10-CM | POA: Diagnosis not present

## 2022-11-11 DIAGNOSIS — S0001XA Abrasion of scalp, initial encounter: Secondary | ICD-10-CM | POA: Diagnosis not present

## 2022-11-11 MED ORDER — ACETAMINOPHEN 325 MG PO TABS
650.0000 mg | ORAL_TABLET | Freq: Once | ORAL | Status: AC
  Administered 2022-11-11: 650 mg via ORAL
  Filled 2022-11-11: qty 2

## 2022-11-11 NOTE — ED Provider Notes (Signed)
[facility] AT [facility] Provider Note   CSN: [id number] Arrival date & time: 11/11/22  1835     History  Chief Complaint  Patient presents with   De Burrs is a 89 y.o. male. With past medical history of COPD, DM, GERD, LVH who presents to the emergency department with fall.   States this evening he was outside in his yard gardening when he lost his balance and fell.  He states that he had 1 foot in his garden bed and the other outside when he went to step out of the garden bed he stumbled backwards, twisting and falling onto his right shoulder.  He does remember striking his head but denies having any loss of consciousness.  He states that he immediately had pain to the right shoulder and right ribs.  He denies having any lightheadedness or dizziness or chest pain or shortness of breath prior to or after falling.  He does not take anticoagulation.  He does have history of right shoulder injury and has been told by his orthopedics that he needs a right shoulder replacement.  He denies any numbness or tingling down the arm.  Denies neck pain, hip pain.  He does not use a cane or walker at baseline.  Does not have a history of frequent falls.   Fall       Home Medications Prior to Admission medications   Medication Sig Start Date End Date Taking? Authorizing Provider  Ascorbic Acid (VITAMIN C) 1000 MG tablet Take 1,000 mg by mouth. 10/05/15   [provider]  bacitracin ointment Apply 1 Application topically 2 (two) times daily. Patient not taking: Reported on 10/15/2022 09/01/22   Virgina Norfolk, DO  beclomethasone (QVAR) 80 MCG/ACT inhaler Inhale 2 puffs into the lungs 2 (two) times daily. Patient not taking: Reported on 10/15/2022    [provider]  Biotin 5000 MCG CAPS Take 1 capsule by mouth daily. Patient not taking: Reported on 10/15/2022 02/18/22   [provider]  budesonide-formoterol (SYMBICORT) 160-4.5  MCG/ACT inhaler Inhale 2 puffs into the lungs 2 (two) times daily. Patient not taking: Reported on 10/15/2022    [provider]  calcium citrate-vitamin D (CITRACAL+D) 315-200 MG-UNIT per tablet Take 1 tablet by mouth 2 (two) times daily.    [provider]  Chromium-Cinnamon (CINNAMON PLUS CHROMIUM PO) Take 2-3 capsules by mouth 2 (two) times daily.    [provider]  Coenzyme Q10 (COQ10) 100 MG CAPS Take 1 capsule by mouth daily.    [provider]  cyanocobalamin (VITAMIN B12) 1000 MCG tablet Take 1,000 mcg by mouth daily. Patient not taking: Reported on 10/15/2022    [provider]  docusate sodium (COLACE) 100 MG capsule Take 100 mg by mouth at bedtime. Patient not taking: Reported on 10/15/2022    [provider]  Ergocalciferol 50 MCG (2000 UT) CAPS Take 50 mcg by mouth once a week. Patient not taking: Reported on 10/15/2022 02/18/22   [provider]  esomeprazole (NEXIUM) 40 MG capsule Take 40 mg by mouth daily.   Patient not taking: Reported on 10/15/2022    [provider]  famotidine (PEPCID) 20 MG tablet Take 20 mg by mouth at bedtime. Reported on 10/18/2015 Patient not taking: Reported on 10/15/2022 07/09/14   [provider]  ferrous sulfate 325 (65 FE) MG tablet Take 1 tablet by mouth daily. 10/05/15   [provider]  fluticasone Aleda Grana)  50 MCG/ACT nasal spray Place 2 sprays into both nostrils daily.    [provider]  furosemide (LASIX) 20 MG tablet Take 1 tablet (20 mg total) by mouth daily. 10/02/22   [name] B, FNP  hydrOXYzine (ATARAX/VISTARIL) 25 MG tablet Take 25 mg by mouth 3 (three) times daily as needed. Patient not taking: Reported on 10/15/2022 06/13/14   [provider]  insulin aspart (NOVOLOG) 100 UNIT/ML FlexPen Inject 6 Units into the skin 3 (three) times daily with meals. Patient not taking: Reported on 10/15/2022    [provider]  insulin  glargine (LANTUS) 100 UNIT/ML injection Inject 18 Units into the skin at bedtime.    [provider]  ipratropium-albuterol (DUONEB) 0.5-2.5 (3) MG/3ML SOLN Reported on 10/18/2015 Patient not taking: Reported on 10/15/2022 05/03/14   [provider]  losartan (COZAAR) 50 MG tablet Take 1 tab po daily 10/02/22   [name] B, FNP  magnesium oxide (MAG-OX) 400 MG tablet Take 400 mg by mouth daily.    [provider]  metFORMIN (GLUMETZA) 500 MG (MOD) 24 hr tablet Take 500 mg by mouth 2 (two) times daily with a meal.    [provider]  methocarbamol (ROBAXIN) 500 MG tablet Take 500 mg by mouth 2 (two) times daily.  Patient not taking: Reported on 10/15/2022    [provider]  Multiple Vitamin (MULTIVITAMIN WITH MINERALS) TABS Take 1 tablet by mouth daily.    [provider]  Multiple Vitamins-Minerals (PRESERVISION AREDS) TABS Take 1 tablet by mouth daily.    [provider]  omeprazole (PRILOSEC) 20 MG capsule Take 1 capsule (20 mg total) by mouth daily. 10/02/22   Eulis Foster, FNP  Potassium 99 MG TABS Take 99 mg by mouth daily.    [provider]  Respiratory Therapy Supplies (FLUTTER) DEVI Use as directed 10/03/14   Nyoka Cowden, MD  rosuvastatin (CRESTOR) 40 MG tablet Take 1 tablet (40 mg total) by mouth at bedtime. 10/02/22   Eulis Foster, FNP  sildenafil (VIAGRA) 100 MG tablet Take 100 mg by mouth daily as needed for erectile dysfunction. Patient not taking: Reported on 10/15/2022    [provider]  tamsulosin (FLOMAX) 0.4 MG CAPS capsule Take 1 capsule (0.4 mg total) by mouth daily. 10/02/22   Eulis Foster, FNP  terbinafine (LAMISIL) 250 MG tablet Take 250 mg by mouth daily.    [provider]  triamcinolone cream (KENALOG) 0.1 % Apply 1 application topically daily as needed. For rash/ itching    [provider]  TRULICITY 0.75 MG/0.5ML SOPN  10/12/19   [provider]       Allergies    Sitagliptin, Wound dressing adhesive, and Tape    Review of Systems   Review of Systems  Musculoskeletal:  Positive for arthralgias. Negative for neck pain.  All other systems reviewed and are negative.   Physical Exam Updated Vital Signs BP (!) 139/54   Pulse 68   Temp 98 F (36.7 C) (Oral)   Resp 16   SpO2 100%  Physical Exam Vitals and nursing note reviewed.  Constitutional:      General: He is not in acute distress.    Appearance: Normal appearance. He is obese. He is not ill-appearing or toxic-appearing.  HENT:     Head: Normocephalic.     Comments: Into the right temple    Mouth/Throat:     Mouth: Mucous membranes are moist.  Pharynx: Oropharynx is clear.  Eyes:     General: No scleral icterus.    Extraocular Movements: Extraocular movements intact.     Pupils: Pupils are equal, round, and reactive to light.  Cardiovascular:     Rate and Rhythm: Normal rate and regular rhythm.     Pulses: Normal pulses.     Heart sounds: Normal heart sounds. No murmur heard. Pulmonary:     Effort: Pulmonary effort is normal. No respiratory distress.     Breath sounds: Normal breath sounds.  Chest:     Chest wall: Tenderness present.     Comments: Tenderness over the right rib cage to palpation.  There is no bruising or swelling.  Lung sounds are clear bilaterally.  There is no abdominal tenderness or distention. Abdominal:     General: Bowel sounds are normal. There is no distension.     Palpations: Abdomen is soft.     Tenderness: There is no abdominal tenderness.  Musculoskeletal:     Right shoulder: Tenderness present. No deformity. Decreased range of motion. Normal pulse.     Cervical back: Normal range of motion and neck supple. No rigidity or tenderness.     Comments: Tenderness with active range of motion of the right shoulder.  No pain with passive range of motion.  He has some nonbony tenderness over the posterior right shoulder.  Neurovascularly  intact.  Skin:    General: Skin is warm and dry.     Capillary Refill: Capillary refill takes less than 2 seconds.  Neurological:     General: No focal deficit present.     Mental Status: He is alert and oriented to person, place, and time. Mental status is at baseline.     Motor: No weakness.  Psychiatric:        Mood and Affect: Mood normal.        Behavior: Behavior normal.        Thought Content: Thought content normal.        Judgment: Judgment normal.     ED Results / Procedures / Treatments   Labs (all labs ordered are listed, but only abnormal results are displayed) Labs Reviewed - No data to display  EKG None  Radiology CT Cervical Spine Wo Contrast  Result Date: 11/11/2022 CLINICAL DATA:  Neck trauma (Age >= 65y). EXAM: CT CERVICAL SPINE WITHOUT CONTRAST TECHNIQUE: Multidetector CT imaging of the cervical spine was performed without intravenous contrast. Multiplanar CT image reconstructions were also generated. RADIATION DOSE REDUCTION: This exam was performed according to the departmental dose-optimization program which includes automated exposure control, adjustment of the mA and/or kV according to patient size and/or use of iterative reconstruction technique. COMPARISON:  10/18/2015 FINDINGS: Alignment: Normal Skull base and vertebrae: No acute fracture. No primary bone lesion or focal pathologic process. Soft tissues and spinal canal: No prevertebral fluid or swelling. No visible canal hematoma. Disc levels: Mild bilateral degenerative facet disease. Early degenerative disc disease. No disc herniation. Upper chest: No acute findings Other: None IMPRESSION: Mild cervical spondylosis.  No acute bony abnormality. Electronically Signed   By: Charlett Nose M.D.   On: 11/11/2022 23:18   CT Head Wo Contrast  Result Date: 11/11/2022 CLINICAL DATA:  Head trauma, minor (Age >= 65y) EXAM: CT HEAD WITHOUT CONTRAST TECHNIQUE: Contiguous axial images were obtained from the base of the  skull through the vertex without intravenous contrast. RADIATION DOSE REDUCTION: This exam was performed according to the departmental dose-optimization program which includes automated exposure  control, adjustment of the mA and/or kV according to patient size and/or use of iterative reconstruction technique. COMPARISON:  04/16/2016 FINDINGS: Brain: Diffuse cerebral atrophy. Bilateral low-density CSF fluid collections overlying the frontal lobes may reflect subdural hygromas, 9 mm on the right and 10 mm on the left. These are slightly more prominent than prior study. No acute intracranial abnormality. Specifically, no hemorrhage, hydrocephalus, mass lesion, acute infarction, or significant intracranial injury. Vascular: No hyperdense vessel or unexpected calcification. Skull: No acute calvarial abnormality.  Bilateral burr holes noted. Sinuses/Orbits: No acute findings Other: None IMPRESSION: Bilateral low-density fluid collections overlie the frontal lobes compatible with subdural hygromas. No acute intracranial abnormality. Electronically Signed   By: Charlett Nose M.D.   On: 11/11/2022 23:16   DG Chest 2 View  Result Date: 11/11/2022 CLINICAL DATA:  Fall.  Right rib pain. EXAM: CHEST - 2 VIEW COMPARISON:  None Available. FINDINGS: The heart size is normal. Atherosclerotic calcifications are present at the aortic arch. The lungs are clear. No edema or effusion is present. The visualized soft tissues and bony thorax are unremarkable. No pneumothorax is present. IMPRESSION: 1. No acute cardiopulmonary disease. 2. Atherosclerosis. Electronically Signed   By: Marin Roberts M.D.   On: 11/11/2022 19:32   DG Shoulder Right  Result Date: 11/11/2022 CLINICAL DATA:  Fall.  Right shoulder pain. EXAM: RIGHT SHOULDER - 3 VIEW COMPARISON:  None Available. FINDINGS: The right shoulder is located. Mild degenerative changes are noted. No acute abnormality is present. IMPRESSION: Mild degenerative changes of the right  shoulder. Electronically Signed   By: Marin Roberts M.D.   On: 11/11/2022 19:31    Procedures Procedures   Medications Ordered in ED Medications  acetaminophen (TYLENOL) tablet 650 mg (650 mg Oral Given 11/11/22 2319)    ED Course/ Medical Decision Making/ A&P Clinical Course as of 11/11/22 2340  Mon Nov 11, 2022  2028 DG Shoulder Right [OZ]    Clinical Course User Index [OZ] Smitty Knudsen, PA-C    Medical Decision Making Amount and/or Complexity of Data Reviewed Radiology: ordered.  Risk OTC drugs.  Initial Impression and Ddx 89 year old male who presents to the emergency department with fall  Patient PMH that increases complexity of ED encounter:  LVH, GERD, DM, COPD  Differential: mechanical fall, syncope, hypoglycemia, etc.   Interpretation of Diagnostics I independent reviewed and interpreted the labs as followed: Not indicated  - I independently visualized the following imaging with scope of interpretation limited to determining acute life threatening conditions related to emergency care: XR right shoulder, chest, which revealed no acute findings.  CT head shows IMPRESSION: Bilateral low-density fluid collections overlie the frontal lobes compatible with subdural hygromas. No acute intracranial abnormality  Patient Reassessment and Ultimate Disposition/Management 89 year old male who presents to the emergency department with fall. He is overall well-appearing, nonseptic and nontoxic in appearance.  Hemodynamically stable.  He is alert and oriented and appropriate.  Overall physical exam he has a minor abrasion to the right temporal scalp without underlying hematoma or crepitus.  No C-spine tenderness to palpation.  He does have some tenderness of the right shoulder and right rib cage.  Decreased active range of motion to the right shoulder but normal passive range of motion.  History of previous right shoulder injury.  No pelvic instability.  He has normal  strength to bilateral lower extremities.  Obtain an x-ray of the right shoulder and chest which showed no acute findings.  Also obtain a CT of his head given  his age and striking his head upon fall.  This showed IMPRESSION: Bilateral low-density fluid collections overlie the frontal lobes compatible with subdural hygromas. No acute intracranial abnormality.  Given Tylenol and ice while he was here.  Otherwise do not feel that he needs laboratory workup.  He denies having any chest pain, shortness of breath, lightheadedness or dizziness, palpitations or recent illnesses prior to falling.  Regarding CT head findings, he has history of chronic bilateral hygromas. These appear to be unchanged. He was followed by NSGY at [facility] for these. There is no new acute subdural bleed found on today's scans. He is neurologically intact. Not anticoagulated. Do not feel he needs further observation as this happened about 6 hours ago or new scan. Will discharge with PCP follow up PRN and return precautions given.   The patient has been appropriately medically screened and/or stabilized in the ED. I have low suspicion for any other emergent medical condition which would require further screening, evaluation or treatment in the ED or require inpatient management. At time of discharge the patient is hemodynamically stable and in no acute distress. I have discussed work-up results and diagnosis with patient and answered all questions. Patient is agreeable with discharge plan. We discussed strict return precautions for returning to the emergency department and they verbalized understanding.     Patient management required discussion with the following services or consulting groups:  None  Complexity of Problems Addressed Acute complicated illness or Injury  Additional Data Reviewed and Analyzed Further history obtained from: EMS on arrival, Further history from spouse/family member, Past medical history and medications  listed in the EMR, and Care Everywhere  Patient Encounter Risk Assessment None  Final Clinical Impression(s) / ED Diagnoses Final diagnoses:  Fall, initial encounter    Rx / DC Orders ED Discharge Orders     None         Cristopher Peru, PA-C 11/11/22 2340    Glendora Score, MD 11/12/22 1533

## 2022-11-11 NOTE — ED Triage Notes (Signed)
Pt BIB [facility] with reports of fall while working in his garden. Pt reports he lost his balance and fell. Pt C/O right shoulder pain and right rib pain.

## 2022-11-11 NOTE — Discharge Instructions (Signed)
You were seen in the emergency department today for a fall. Your X-rays and CT scan are all without any acute findings. You will be sore in the coming days. Please use tylenol and ibuprofen on a schedule over the next few days to help improve symptoms. Please also use ice for 15-20 minutes at a time multiple times a day on your shoulder and perform gentle range of motion each day multiple times. Please return for significantly worsening symptoms. Please follow-up with your orthopedist Dr. Everardo Pacific for your right shoulder.

## 2022-11-15 ENCOUNTER — Other Ambulatory Visit: Payer: Self-pay | Admitting: Orthopaedic Surgery

## 2022-11-15 DIAGNOSIS — Z01818 Encounter for other preprocedural examination: Secondary | ICD-10-CM

## 2022-11-18 ENCOUNTER — Inpatient Hospital Stay: Payer: Medicare Other | Admitting: Family Medicine

## 2022-11-18 ENCOUNTER — Ambulatory Visit
Admission: RE | Admit: 2022-11-18 | Discharge: 2022-11-18 | Disposition: A | Discharge status: Home / Self Care | Payer: Medicare Other | Source: Ambulatory Visit | Attending: Orthopaedic Surgery | Admitting: Orthopaedic Surgery

## 2022-11-18 ENCOUNTER — Telehealth: Payer: Self-pay

## 2022-11-18 ENCOUNTER — Telehealth: Payer: Self-pay | Admitting: Family Medicine

## 2022-11-18 DIAGNOSIS — Z01818 Encounter for other preprocedural examination: Secondary | ICD-10-CM

## 2022-11-18 NOTE — Telephone Encounter (Signed)
Received e-fax from [facility] regarding surgery clearance. Patient has appt set with Dr. [name] on 11/22/2022 will discuss at that visit.

## 2022-11-22 ENCOUNTER — Ambulatory Visit (INDEPENDENT_AMBULATORY_CARE_PROVIDER_SITE_OTHER): Payer: Medicare Other | Admitting: Family Medicine

## 2022-11-22 ENCOUNTER — Encounter: Payer: Self-pay | Admitting: Family Medicine

## 2022-11-22 VITALS — BP 134/82 | HR 79 | Temp 97.8°F | Wt 209.6 lb

## 2022-11-22 DIAGNOSIS — I429 Cardiomyopathy, unspecified: Secondary | ICD-10-CM

## 2022-11-22 DIAGNOSIS — I878 Other specified disorders of veins: Secondary | ICD-10-CM | POA: Diagnosis not present

## 2022-11-22 DIAGNOSIS — D649 Anemia, unspecified: Secondary | ICD-10-CM

## 2022-11-22 DIAGNOSIS — Z01818 Encounter for other preprocedural examination: Secondary | ICD-10-CM | POA: Diagnosis not present

## 2022-11-22 DIAGNOSIS — I10 Essential (primary) hypertension: Secondary | ICD-10-CM

## 2022-11-22 DIAGNOSIS — E669 Obesity, unspecified: Secondary | ICD-10-CM

## 2022-11-22 DIAGNOSIS — E559 Vitamin D deficiency, unspecified: Secondary | ICD-10-CM

## 2022-11-22 DIAGNOSIS — Z794 Long term (current) use of insulin: Secondary | ICD-10-CM

## 2022-11-22 DIAGNOSIS — M25511 Pain in right shoulder: Secondary | ICD-10-CM

## 2022-11-22 DIAGNOSIS — E119 Type 2 diabetes mellitus without complications: Secondary | ICD-10-CM

## 2022-11-22 DIAGNOSIS — Z9889 Other specified postprocedural states: Secondary | ICD-10-CM | POA: Insufficient documentation

## 2022-11-22 DIAGNOSIS — R21 Rash and other nonspecific skin eruption: Secondary | ICD-10-CM | POA: Insufficient documentation

## 2022-11-22 DIAGNOSIS — W19XXXS Unspecified fall, sequela: Secondary | ICD-10-CM

## 2022-11-22 DIAGNOSIS — I4510 Unspecified right bundle-branch block: Secondary | ICD-10-CM

## 2022-11-22 DIAGNOSIS — W19XXXA Unspecified fall, initial encounter: Secondary | ICD-10-CM | POA: Insufficient documentation

## 2022-11-22 DIAGNOSIS — E785 Hyperlipidemia, unspecified: Secondary | ICD-10-CM

## 2022-11-22 DIAGNOSIS — R0789 Other chest pain: Secondary | ICD-10-CM

## 2022-11-22 DIAGNOSIS — R051 Acute cough: Secondary | ICD-10-CM

## 2022-11-22 NOTE — Progress Notes (Signed)
Assessment/Plan:  Total time spent caring for the patient today was 79 minutes. This includes time spent before the visit reviewing the chart, time spent during the visit, and time spent after the visit on documentation, etc.  Problem List Items Addressed This Visit       Cardiovascular and Mediastinum   Essential hypertension   Relevant Orders   CBC with Differential/Platelet   Microalbumin / creatinine urine ratio   TSH   Urinalysis, Routine w reflex microscopic   Venous stasis of both lower extremities    Chronic venous stasis disease causing leg swelling and localized pain with physical pressure.  Plan:  Monitoring of swelling in both legs. Possible use of compression therapy. Ensure regular follow-up with vascular specialists if necessary.      Cardiomyopathy (HCC)   Relevant Orders   B Nat Peptide   Protime-INR   RBBB   Relevant Orders   B Nat Peptide     Endocrine   Type 2 diabetes mellitus without complication, with long-term current use of insulin (HCC)    Patient has well-managed type 2 diabetes with latest HbA1c of 6.3 in March.  Plan:  Continue current diabetes management plan. HbA1c test required within 3 months of surgery. Discuss fasting blood glucose levels and possible need for further intervention if necessary.      Relevant Orders   Comprehensive metabolic panel   Hemoglobin A1c     Musculoskeletal and Integument   Rash     Other   H/O local excision of skin lesion on right lower leg    Patient recovering from surgical excision with ongoing care for wound management.  Plan:  Continue current wound management with Duraderm as prescribed. Monitor for signs of infection and consult dermatology if issues arise.      Vitamin D deficiency   Relevant Orders   Vitamin D 1,25 dihydroxy   Acute cough    Get a chest X-ray to rule out ongoing respiratory infection causing sputum production.      Relevant Orders   B Nat Peptide   DG Chest  2 View   Chest wall pain   Relevant Orders   B Nat Peptide   DG Chest 2 View   Class 1 obesity with serious comorbidity in adult   Hyperlipidemia   Relevant Orders   Lipid panel   Acute pain of right shoulder    Recent fall leading to multiple bruises, pain in right shoulder and ribs, left shoulder strain.  Plan:  Continue taking acetaminophen 650 mg for pain as needed. Follow-up imaging (CT and X-rays) to monitor for pneumonia or any unforeseen complications. Monitor left shoulder for any worsening condition.      Relevant Medications   acetaminophen (TYLENOL) 650 MG CR tablet   Fall   Preop general physical exam - Primary   Relevant Orders   Protime-INR   Other Visit Diagnoses     Anemia, unspecified type       Relevant Orders   CBC with Differential/Platelet       There are no discontinued medications.  Return if symptoms worsen or fail to improve, for fasting labs.    Subjective:   Encounter date: 11/22/2022  Tim Morgan is a 89 y.o. male who has Acute medial meniscal tear; Chronic sinusitis/chronic cough ; Mild persistent asthma in adult without complication; Essential hypertension; Upper airway cough syndrome; Venous stasis of both lower extremities; Cellulitis of face; Type 2 diabetes mellitus without complication, with long-term current  use of insulin (HCC); H/O local excision of skin lesion on right lower leg; Rash; Vitamin D deficiency; Cardiomyopathy (HCC); RBBB; Acute cough; Chest wall pain; Class 1 obesity with serious comorbidity in adult; Hyperlipidemia; Acute pain of right shoulder; Fall; and Preop general physical exam on their problem list..   He  has a past medical history of Acute bronchitis, COPD (chronic obstructive pulmonary disease) (HCC), Diabetes mellitus, Edema of left lower extremity (07/03/2012), GERD (gastroesophageal reflux disease), History of back injury (07/01/1988), Hypertension (12/06/2011), Left ventricular hypertrophy, Melanoma  (HCC), and Vitamin D deficiency..   Chief Complaint: Follow-up for surgical clearance following a fall on 05/13.  History of Present Illness:  Fall. Patient experienced a fall on May 13th while in the yard, causing shoulder pain injury which resulted in a right reverse total shoulder replacement scheduled for June 26th. The patient has reported significant pain in the right shoulder and ribs due to the fall. He also presents discomfort in the left shoulder blade, believed to be due to strain or injury from the same incident. The patient has a history of having fallen on the same shoulder a few years ago, but the injury was not as severe then.  Right Lower Leg Wound. The patient is currently healing from a skin cancer excision (squamous cell carcinoma) performed by Dr. Sherrie George. The wound area is being managed with various treatments including a chemotherapy cream initially, followed by Vaseline, and most recently, Duraderm.  Respiratory Symptoms. The patient has begun to cough up grayish sputum, approximately a tablespoon or more each time, which has increased in volume and darkened recently. He reports no fever or chills at present but notes that he was feeling colder than usual after the fall.  Diabetes Management. The patient has type 2 diabetes, which is currently well-managed with a HbA1c of 6.3 from March. He also has elevated fasting glucose levels ranging from 101 to 159.  Nocturia. The patient is experiencing increased nocturnal urination (up to 3 times per night). He is on tamsulosin but has a history of an enlarged prostate.  Chronic Conditions. He has known venous stasis leading to chronic swelling of the legs, and he has been experiencing pain when pressing on new "whisker-like" growths on his legs.   Review of Systems  Constitutional:  Positive for chills. Negative for fever.  Respiratory:  Positive for cough and sputum production. Negative for hemoptysis, shortness of breath and  wheezing.   Cardiovascular:  Positive for chest pain (right lower, with cough and deep breathing) and leg swelling (chronic from venous stasis, but no change).  Genitourinary:  Positive for frequency.       Nocturia x3  Musculoskeletal:  Positive for back pain (left shoulder blade) and falls.  Skin:  Positive for rash (right lower leg).   Revised Cardiac Risk Index for Pre-Operative Risk from [email or web address]  on 11/22/2022 ** All calculations should be rechecked by clinician prior to use **  RESULT SUMMARY: 1 points Class II Risk  6.0 % 30-day risk of death, MI, or cardiac arrest  From Duceppe 2017. These numbers are higher than those from the original study Nedra Hai 1999). See Evidence for details.   INPUTS: Elevated-risk surgery --> 0 = No History of ischemic heart disease --> 0 = No History of congestive heart failure --> 0 = No History of cerebrovascular disease --> 0 = No Pre-operative treatment with insulin --> 1 = Yes Pre-operative creatinine >2 mg/dL / 161.0 mol/L --> 0 = No   Past Surgical  History:  Procedure Laterality Date   ANKLE SURGERY Left 07/01/1988   APPENDECTOMY  age 89   BRAIN SURGERY  07/01/1993   concusion   HERNIA REPAIR     x 2   KNEE ARTHROSCOPY Left 11/18/2012   Procedure: LEFT ARTHROSCOPY KNEE WITH DEBRIDEMENT AND CHRONDROLPLASTY;  Surgeon: Loanne Drilling, MD;  Location: [facility];  Service: Orthopedics;  Laterality: Left;   KNEE SURGERY     NASAL SINUS SURGERY Bilateral    to open up ear tubes    Outpatient Medications Prior to Visit  Medication Sig Dispense Refill   acetaminophen (TYLENOL) 650 MG CR tablet Take 650 mg by mouth every 8 (eight) hours as needed for pain.     Ascorbic Acid (VITAMIN C) 1000 MG tablet Take 1,000 mg by mouth.     Biotin 5000 MCG CAPS Take 1 capsule by mouth daily.     calcium citrate-vitamin D (CITRACAL+D) 315-200 MG-UNIT per tablet Take 1 tablet by mouth 2 (two) times daily.     Chromium-Cinnamon (CINNAMON PLUS CHROMIUM  PO) Take 2-3 capsules by mouth 2 (two) times daily.     Coenzyme Q10 (COQ10) 100 MG CAPS Take 1 capsule by mouth daily.     cyanocobalamin (VITAMIN B12) 1000 MCG tablet Take 1,000 mcg by mouth daily.     ferrous sulfate 325 (65 FE) MG tablet Take 1 tablet by mouth daily.     furosemide (LASIX) 20 MG tablet Take 1 tablet (20 mg total) by mouth daily. (Patient taking differently: Take 40 mg by mouth daily.) 90 tablet 1   insulin glargine (LANTUS) 100 UNIT/ML injection Inject 18 Units into the skin at bedtime.     losartan (COZAAR) 50 MG tablet Take 1 tab po daily 90 tablet 1   magnesium oxide (MAG-OX) 400 MG tablet Take 400 mg by mouth daily.     metFORMIN (GLUMETZA) 500 MG (MOD) 24 hr tablet Take 500 mg by mouth 2 (two) times daily with a meal.     Multiple Vitamin (MULTIVITAMIN WITH MINERALS) TABS Take 1 tablet by mouth daily.     Multiple Vitamins-Minerals (PRESERVISION AREDS) TABS Take 1 tablet by mouth daily.     omeprazole (PRILOSEC) 20 MG capsule Take 1 capsule (20 mg total) by mouth daily. 90 capsule 1   Potassium 99 MG TABS Take 99 mg by mouth daily.     Respiratory Therapy Supplies (FLUTTER) DEVI Use as directed 1 each 0   rosuvastatin (CRESTOR) 40 MG tablet Take 1 tablet (40 mg total) by mouth at bedtime. 90 tablet 1   tamsulosin (FLOMAX) 0.4 MG CAPS capsule Take 1 capsule (0.4 mg total) by mouth daily. 90 capsule 1   terbinafine (LAMISIL) 250 MG tablet Take 250 mg by mouth daily.     triamcinolone cream (KENALOG) 0.1 % Apply 1 application topically daily as needed. For rash/ itching     TRULICITY 0.75 MG/0.5ML SOPN      bacitracin ointment Apply 1 Application topically 2 (two) times daily. (Patient not taking: Reported on 10/15/2022) 120 g 0   beclomethasone (QVAR) 80 MCG/ACT inhaler Inhale 2 puffs into the lungs 2 (two) times daily. (Patient not taking: Reported on 10/15/2022)     budesonide-formoterol (SYMBICORT) 160-4.5 MCG/ACT inhaler Inhale 2 puffs into the lungs 2 (two) times  daily. (Patient not taking: Reported on 10/15/2022)     docusate sodium (COLACE) 100 MG capsule Take 100 mg by mouth at bedtime. (Patient not taking: Reported on 10/15/2022)  Ergocalciferol 50 MCG (2000 UT) CAPS Take 50 mcg by mouth once a week. (Patient not taking: Reported on 10/15/2022)     esomeprazole (NEXIUM) 40 MG capsule Take 40 mg by mouth daily.   (Patient not taking: Reported on 10/15/2022)     famotidine (PEPCID) 20 MG tablet Take 20 mg by mouth at bedtime. Reported on 10/18/2015 (Patient not taking: Reported on 10/15/2022)  2   fluticasone (FLONASE) 50 MCG/ACT nasal spray Place 2 sprays into both nostrils daily. (Patient not taking: Reported on 11/22/2022)     hydrOXYzine (ATARAX/VISTARIL) 25 MG tablet Take 25 mg by mouth 3 (three) times daily as needed. (Patient not taking: Reported on 10/15/2022)  1   insulin aspart (NOVOLOG) 100 UNIT/ML FlexPen Inject 6 Units into the skin 3 (three) times daily with meals. (Patient not taking: Reported on 10/15/2022)     ipratropium-albuterol (DUONEB) 0.5-2.5 (3) MG/3ML SOLN Reported on 10/18/2015 (Patient not taking: Reported on 10/15/2022)  3   methocarbamol (ROBAXIN) 500 MG tablet Take 500 mg by mouth 2 (two) times daily.  (Patient not taking: Reported on 10/15/2022)     sildenafil (VIAGRA) 100 MG tablet Take 100 mg by mouth daily as needed for erectile dysfunction. (Patient not taking: Reported on 10/15/2022)     No facility-administered medications prior to visit.    History reviewed. No pertinent family history.  Social History   Socioeconomic History   Marital status: Married    Spouse name: Not on file   Number of children: Not on file   Years of education: Not on file   Highest education level: Not on file  Occupational History   Occupation: Retired [employer]  Tobacco Use   Smoking status: Never   Smokeless tobacco: Never  Vaping Use   Vaping Use: Never used  Substance and Sexual Activity   Alcohol use: No   Drug use: No   Sexual  activity: Not on file  Other Topics Concern   Not on file  Social History Narrative   Not on file   Social Determinants of Health   Financial Resource Strain: Not on file  Food Insecurity: Not on file  Transportation Needs: Not on file  Physical Activity: Not on file  Stress: Not on file  Social Connections: Not on file  Intimate Partner Violence: Not on file                                                                                                  Objective:  Physical Exam: BP 134/82 (BP Location: Left Arm, Patient Position: Sitting, Cuff Size: Large)   Pulse 79   Temp 97.8 F (36.6 C) (Temporal)   Wt 209 lb 9.6 oz (95.1 kg)   SpO2 99%   BMI 30.95 kg/m     Physical Exam Constitutional:      Appearance: Normal appearance.  HENT:     Head: Normocephalic and atraumatic.     Right Ear: Hearing normal.     Left Ear: Hearing normal.     Nose: Nose normal.  Eyes:     General: No  scleral icterus.       Right eye: No discharge.        Left eye: No discharge.     Extraocular Movements: Extraocular movements intact.  Cardiovascular:     Rate and Rhythm: Normal rate and regular rhythm.     Heart sounds: Normal heart sounds.  Pulmonary:     Effort: Pulmonary effort is normal.     Breath sounds: Normal breath sounds.  Abdominal:     Palpations: Abdomen is soft.     Tenderness: There is no abdominal tenderness.  Musculoskeletal:     Right lower leg: 2+ Pitting Edema present.     Left lower leg: 3+ Pitting Edema present.  Skin:    General: Skin is warm.     Findings: Rash (multiple small red pappules scattered across right lower leg, mildly tender) and wound (covered excised lesion on right lower extremity, surrounded by erythema, but no drainage) present. Rash is scaling (scatterd across lower extremities consistent with venous statis dermatitis).     Comments: Dry skin with scaling bilateral lower extremities without any ulceration  Neurological:     General: No  focal deficit present.     Mental Status: He is alert.     Cranial Nerves: No cranial nerve deficit.  Psychiatric:        Mood and Affect: Mood normal.        Behavior: Behavior normal.        Thought Content: Thought content normal.        Judgment: Judgment normal.    11/12/2022:  ECG: NSR with RBBB Lab Results  Component Value Date   WBC 5.6 02/25/2022   HGB 13.4 (A) 02/25/2022   HCT 41 02/25/2022   MCV 92.1 05/19/2013   PLT 245 02/25/2022   Lab Results  Component Value Date   HGBA1C 7.1 05/10/2021   Lab Results  Component Value Date   CHOL 136 02/25/2022   HDL 49 02/25/2022   LDLCALC 62 02/25/2022   TRIG 124 02/25/2022    Last metabolic panel Lab Results  Component Value Date        NA 140 02/25/2022   K 4.6 02/25/2022   CL 102 02/25/2022   CO2 30 (A) 02/25/2022   BUN 28 (A) 02/25/2022   CREATININE 1.1 02/25/2022   EGFR 61 02/25/2022   CALCIUM 9.5 08/09/2021   PROT 7.2 05/19/2013   ALBUMIN 4.3 02/25/2022   BILITOT 0.5 05/19/2013   ALKPHOS 52 02/25/2022   AST 23 08/09/2021   ALT 23 08/09/2021     CT Cervical Spine Wo Contrast  Result Date: 11/11/2022 CLINICAL DATA:  Neck trauma (Age >= 65y). EXAM: CT CERVICAL SPINE WITHOUT CONTRAST TECHNIQUE: Multidetector CT imaging of the cervical spine was performed without intravenous contrast. Multiplanar CT image reconstructions were also generated. RADIATION DOSE REDUCTION: This exam was performed according to the departmental dose-optimization program which includes automated exposure control, adjustment of the mA and/or kV according to patient size and/or use of iterative reconstruction technique. COMPARISON:  10/18/2015 FINDINGS: Alignment: Normal Skull base and vertebrae: No acute fracture. No primary bone lesion or focal pathologic process. Soft tissues and spinal canal: No prevertebral fluid or swelling. No visible canal hematoma. Disc levels: Mild bilateral degenerative facet disease. Early degenerative disc  disease. No disc herniation. Upper chest: No acute findings Other: None IMPRESSION: Mild cervical spondylosis.  No acute bony abnormality. Electronically Signed   By: Charlett Nose M.D.   On: 11/11/2022 23:18   CT  Head Wo Contrast  Result Date: 11/11/2022 CLINICAL DATA:  Head trauma, minor (Age >= 65y) EXAM: CT HEAD WITHOUT CONTRAST TECHNIQUE: Contiguous axial images were obtained from the base of the skull through the vertex without intravenous contrast. RADIATION DOSE REDUCTION: This exam was performed according to the departmental dose-optimization program which includes automated exposure control, adjustment of the mA and/or kV according to patient size and/or use of iterative reconstruction technique. COMPARISON:  04/16/2016 FINDINGS: Brain: Diffuse cerebral atrophy. Bilateral low-density CSF fluid collections overlying the frontal lobes may reflect subdural hygromas, 9 mm on the right and 10 mm on the left. These are slightly more prominent than prior study. No acute intracranial abnormality. Specifically, no hemorrhage, hydrocephalus, mass lesion, acute infarction, or significant intracranial injury. Vascular: No hyperdense vessel or unexpected calcification. Skull: No acute calvarial abnormality.  Bilateral burr holes noted. Sinuses/Orbits: No acute findings Other: None IMPRESSION: Bilateral low-density fluid collections overlie the frontal lobes compatible with subdural hygromas. No acute intracranial abnormality. Electronically Signed   By: Charlett Nose M.D.   On: 11/11/2022 23:16   DG Chest 2 View  Result Date: 11/11/2022 CLINICAL DATA:  Fall.  Right rib pain. EXAM: CHEST - 2 VIEW COMPARISON:  None Available. FINDINGS: The heart size is normal. Atherosclerotic calcifications are present at the aortic arch. The lungs are clear. No edema or effusion is present. The visualized soft tissues and bony thorax are unremarkable. No pneumothorax is present. IMPRESSION: 1. No acute cardiopulmonary disease. 2.  Atherosclerosis. Electronically Signed   By: Marin Roberts M.D.   On: 11/11/2022 19:32   DG Shoulder Right  Result Date: 11/11/2022 CLINICAL DATA:  Fall.  Right shoulder pain. EXAM: RIGHT SHOULDER - 3 VIEW COMPARISON:  None Available. FINDINGS: The right shoulder is located. Mild degenerative changes are noted. No acute abnormality is present. IMPRESSION: Mild degenerative changes of the right shoulder. Electronically Signed   By: Marin Roberts M.D.   On: 11/11/2022 19:31   VAS [place] LOWER EXTREMITY VENOUS REFLUX  Result Date: 10/15/2022  Lower Venous Reflux Study Patient Name:  Tim Morgan  Date of Exam:   10/15/2022 Medical Rec #: [id number]       Accession #:    [id number] Date of Birth: 17-Jun-1933       Patient Gender: M Patient Age:   49 years Exam Location:  [facility] Procedure:      VAS [place] LOWER EXTREMITY VENOUS REFLUX Referring Phys: Sherald Hess --------------------------------------------------------------------------------  Indications: Swelling, and Edema.  Risk Factors: Immobility. Comparison Study: 02/13/21 LT LE DVT study - Negative                   10/19/19 RT LE DVT study - Negative                   07/03/12 LT LE reflux study Performing Technologist: Lowell Guitar RVT, RDMS  Examination Guidelines: A complete evaluation includes B-mode imaging, spectral Doppler, color Doppler, and power Doppler as needed of all accessible portions of each vessel. Bilateral testing is considered an integral part of a complete examination. Limited examinations for reoccurring indications may be performed as noted. The reflux portion of the exam is performed with the patient in reverse Trendelenburg. Significant venous reflux is defined as >500 ms in the superficial venous system, and >1 second in the deep venous system.  +--------------+---------+------+-----------+------------+--------+ LEFT          Reflux NoRefluxReflux TimeDiameter cmsComments  Yes                                  +--------------+---------+------+-----------+------------+--------+ CFV           no                                             +--------------+---------+------+-----------+------------+--------+ FV prox       no                                             +--------------+---------+------+-----------+------------+--------+ FV mid        no                                             +--------------+---------+------+-----------+------------+--------+ FV dist       no                                             +--------------+---------+------+-----------+------------+--------+ Popliteal               yes   >1 second                      +--------------+---------+------+-----------+------------+--------+ GSV at SFJ              yes    >500 ms      0.87             +--------------+---------+------+-----------+------------+--------+ GSV prox thigh          yes    >500 ms      0.41             +--------------+---------+------+-----------+------------+--------+ GSV mid thigh           yes    >500 ms      0.29             +--------------+---------+------+-----------+------------+--------+ GSV dist thigh          yes    >500 ms      0.24             +--------------+---------+------+-----------+------------+--------+ GSV at knee   no                            0.20             +--------------+---------+------+-----------+------------+--------+ GSV prox calf           yes    >500 ms      0.32             +--------------+---------+------+-----------+------------+--------+ SSV Pop Fossa no                            0.42             +--------------+---------+------+-----------+------------+--------+ SSV prox calf no  0.20             +--------------+---------+------+-----------+------------+--------+   Summary: Left: - No evidence of deep vein thrombosis seen in the  left lower extremity, from the common femoral through the popliteal veins. - No evidence of superficial venous thrombosis in the left lower extremity.  - No evidence of superficial venous reflux seen in the left short saphenous vein.  - Venous reflux is noted in the left sapheno-femoral junction. - Venous reflux is noted in the left greater saphenous vein in the thigh. - Venous reflux is noted in the left greater saphenous vein in the calf. - Venous reflux is noted in the left popliteal vein.  *See table(s) above for measurements and observations. Electronically signed by Sherald Hess MD on 10/15/2022 at 12:07:46 PM.    Final     No results found for this or any previous visit (from the past 2160 hour(s)).      Garner Nash, MD, MS

## 2022-11-22 NOTE — Assessment & Plan Note (Signed)
Get a chest X-ray to rule out ongoing respiratory infection causing sputum production.

## 2022-11-22 NOTE — Patient Instructions (Addendum)
Procedure and Follow-Up:  Scheduled for right reverse total shoulder replacement on June 26th, 2024. Follow-up appointment on May 28th for blood work.  Pre-Surgical Preparation:  Plan for fasting blood work next week (cholesterol, kidney function, etc.). Fast for 8-12 hours before. For chest xray, go to:   [facility] [facility] [address] [place], [place], [place] [address] Phone: [phone]  Monitoring Symptoms:  Watch for coughing, wheezing, shortness of breath, or fever. Seek immediate attention if these worsen. Use Tylenol for pain, avoid NSAIDs like Aleve. Perform deep breathing exercises; watch for increased pain.  Emergency Symptoms:  Go to the emergency department for severe chest pain, significant shortness of breath, high fever, or severe respiratory distress.

## 2022-11-22 NOTE — Assessment & Plan Note (Signed)
Patient has well-managed type 2 diabetes with latest HbA1c of 6.3 in March.  Plan:  Continue current diabetes management plan. HbA1c test required within 3 months of surgery. Discuss fasting blood glucose levels and possible need for further intervention if necessary.

## 2022-11-22 NOTE — Assessment & Plan Note (Signed)
Patient recovering from surgical excision with ongoing care for wound management.  Plan:  Continue current wound management with Duraderm as prescribed. Monitor for signs of infection and consult dermatology if issues arise.

## 2022-11-22 NOTE — Assessment & Plan Note (Signed)
Chronic venous stasis disease causing leg swelling and localized pain with physical pressure.  Plan:  Monitoring of swelling in both legs. Possible use of compression therapy. Ensure regular follow-up with vascular specialists if necessary.

## 2022-11-22 NOTE — Assessment & Plan Note (Signed)
Recent fall leading to multiple bruises, pain in right shoulder and ribs, left shoulder strain.  Plan:  Continue taking acetaminophen 650 mg for pain as needed. Follow-up imaging (CT and X-rays) to monitor for pneumonia or any unforeseen complications. Monitor left shoulder for any worsening condition.

## 2022-11-22 NOTE — Telephone Encounter (Signed)
error 

## 2022-11-26 ENCOUNTER — Ambulatory Visit (INDEPENDENT_AMBULATORY_CARE_PROVIDER_SITE_OTHER)
Admission: RE | Admit: 2022-11-26 | Discharge: 2022-11-26 | Disposition: A | Discharge status: Home / Self Care | Payer: Medicare Other | Source: Ambulatory Visit | Attending: Family Medicine | Admitting: Family Medicine

## 2022-11-26 ENCOUNTER — Other Ambulatory Visit (INDEPENDENT_AMBULATORY_CARE_PROVIDER_SITE_OTHER): Payer: Medicare Other

## 2022-11-26 DIAGNOSIS — R0789 Other chest pain: Secondary | ICD-10-CM | POA: Diagnosis not present

## 2022-11-26 DIAGNOSIS — R051 Acute cough: Secondary | ICD-10-CM | POA: Diagnosis not present

## 2022-11-26 DIAGNOSIS — Z794 Long term (current) use of insulin: Secondary | ICD-10-CM

## 2022-11-26 DIAGNOSIS — I4510 Unspecified right bundle-branch block: Secondary | ICD-10-CM

## 2022-11-26 DIAGNOSIS — E785 Hyperlipidemia, unspecified: Secondary | ICD-10-CM

## 2022-11-26 DIAGNOSIS — I10 Essential (primary) hypertension: Secondary | ICD-10-CM

## 2022-11-26 DIAGNOSIS — E119 Type 2 diabetes mellitus without complications: Secondary | ICD-10-CM

## 2022-11-26 DIAGNOSIS — E559 Vitamin D deficiency, unspecified: Secondary | ICD-10-CM

## 2022-11-26 DIAGNOSIS — I429 Cardiomyopathy, unspecified: Secondary | ICD-10-CM | POA: Diagnosis not present

## 2022-11-26 DIAGNOSIS — Z01818 Encounter for other preprocedural examination: Secondary | ICD-10-CM

## 2022-11-26 LAB — URINALYSIS, ROUTINE W REFLEX MICROSCOPIC
Bilirubin Urine: NEGATIVE
Hgb urine dipstick: NEGATIVE
Leukocytes,Ua: NEGATIVE
Nitrite: NEGATIVE
Specific Gravity, Urine: 1.015 (ref 1.000–1.030)
Total Protein, Urine: NEGATIVE
Urine Glucose: NEGATIVE
Urobilinogen, UA: 0.2 (ref 0.0–1.0)
pH: 7 (ref 5.0–8.0)

## 2022-11-26 LAB — COMPREHENSIVE METABOLIC PANEL
ALT: 19 U/L (ref 0–53)
AST: 21 U/L (ref 0–37)
Albumin: 3.5 g/dL (ref 3.5–5.2)
Alkaline Phosphatase: 78 U/L (ref 39–117)
BUN: 24 mg/dL — ABNORMAL HIGH (ref 6–23)
CO2: 27 mEq/L (ref 19–32)
Calcium: 8.7 mg/dL (ref 8.4–10.5)
Chloride: 104 mEq/L (ref 96–112)
Creatinine, Ser: 1.09 mg/dL (ref 0.40–1.50)
GFR: 60.96 mL/min (ref >60.00)
Glucose, Bld: 84 mg/dL (ref 70–99)
Potassium: 4.5 mEq/L (ref 3.5–5.1)
Sodium: 140 mEq/L (ref 135–145)
Total Bilirubin: 0.5 mg/dL (ref 0.2–1.2)
Total Protein: 5.6 g/dL — ABNORMAL LOW (ref 6.0–8.3)

## 2022-11-26 LAB — LIPID PANEL
Cholesterol: 108 mg/dL (ref 0–200)
HDL: 43.7 mg/dL (ref >39.00)
LDL Cholesterol: 41 mg/dL (ref 0–99)
NonHDL: 63.86
Total CHOL/HDL Ratio: 2
Triglycerides: 112 mg/dL (ref 0.0–149.0)
VLDL: 22.4 mg/dL (ref 0.0–40.0)

## 2022-11-26 LAB — TSH: TSH: 1.04 u[IU]/mL (ref 0.35–5.50)

## 2022-11-26 LAB — MICROALBUMIN / CREATININE URINE RATIO
Creatinine,U: 115.9 mg/dL
Microalb Creat Ratio: 0.6 mg/g (ref 0.0–30.0)
Microalb, Ur: <0.7 mg/dL (ref 0.0–1.9)

## 2022-11-26 LAB — BRAIN NATRIURETIC PEPTIDE: Pro B Natriuretic peptide (BNP): 187 pg/mL — ABNORMAL HIGH (ref 0.0–100.0)

## 2022-11-26 LAB — PROTIME-INR
INR: 0.9 ratio (ref 0.8–1.0)
Prothrombin Time: 10 s (ref 9.6–13.1)

## 2022-11-26 LAB — HEMOGLOBIN A1C: Hgb A1c MFr Bld: 6.7 % — ABNORMAL HIGH (ref 4.6–6.5)

## 2022-11-27 NOTE — Addendum Note (Signed)
Addended by: Garnette Gunner on: 11/27/2022 04:53 PM   Modules accepted: Orders

## 2022-11-29 LAB — VITAMIN D 1,25 DIHYDROXY
Vitamin D 1, 25 (OH)2 Total: 17 pg/mL — ABNORMAL LOW (ref 18–72)
Vitamin D2 1, 25 (OH)2: <8 pg/mL
Vitamin D3 1, 25 (OH)2: 17 pg/mL

## 2022-12-02 ENCOUNTER — Telehealth: Payer: Self-pay | Admitting: Family Medicine

## 2022-12-02 ENCOUNTER — Ambulatory Visit: Payer: Self-pay | Admitting: *Deleted

## 2022-12-02 NOTE — Telephone Encounter (Signed)
Pt needs to have shoulder surgery, but can not until sore on his ankle heals. Someone at the hospital told him to drink Orgain 30 gr protein and it would heal faster. He wants to know if this is so and is it good for him? Please advise

## 2022-12-02 NOTE — Telephone Encounter (Signed)
  Chief Complaint: Protein shakes Symptoms: non healing wound Frequency:  Pertinent Negatives: Patient denies  Disposition: [] ED /[] Urgent Care (no appt availability in office) / [] Appointment(In office/virtual)/ []  [facility]/ [] Home Care/ [] Refused Recommended Disposition /[]  Mobile Bus/ [x]  Follow-up with PCP Additional Notes: Returned pt's call. Pt would like to start using a protein shake in order to help a non healing wound. Pt has found a protein shake that has 30 g of protein and only 1 gram of sugar. Pt will start to drink 1 a day. Pt is diabetic, with good sugar control. Pt has also posed this question to his PCP.   Summary: Protein shakes, question   I have a patient of Dr. Fanny Bien that has a question about protein shakes interfering with current medication. I asked if she contacted Dr. [name] and she says no bc she was instructed to contact the community line should she have any questions.     Reason for Disposition  Caller has medicine question only, adult not sick, AND triager answers question  Answer Assessment - Initial Assessment Questions 1. NAME of MEDICINE: "What medicine(s) are you calling about?"     Protein shakes with current medicines 2. QUESTION: "What is your question?" (e.g., double dose of medicine, side effect)     Can pt drink protein shakes 3. PRESCRIBER: "Who prescribed the medicine?" Reason: if prescribed by specialist, call should be referred to that group.      4. SYMPTOMS: "Do you have any symptoms?" If Yes, ask: "What symptoms are you having?"  "How bad are the symptoms (e.g., mild, moderate, severe)     Wound not resolving  Protocols used: Medication Question Call-A-AH

## 2022-12-02 NOTE — Telephone Encounter (Addendum)
Summary: Protein shakes, question   I have a patient of Dr. Fanny Bien that has a question about protein shakes interfering with current medication. I asked if she contacted Dr. [name] and she says no bc she was instructed to contact the community line should she have any questions.         Attempted to call patient- no answer- left message to call back on voice mail.

## 2022-12-03 NOTE — Telephone Encounter (Signed)
Left patient a detailed voice message regarding annotation below and to return call to office with any concerns.  

## 2022-12-16 NOTE — Progress Notes (Unsigned)
Cardiology Office Note:   Date:  12/18/2022  NAME:  Tim Morgan    MRN: [id number] DOB:  May 31, 1933   PCP:  Garnette Gunner, MD  Cardiologist:  None  Electrophysiologist:  None   Referring MD: Bjorn Pippin, MD   Chief Complaint  Patient presents with   Pre-op Exam        History of Present Illness:   Tim Morgan is a 89 y.o. male with a hx of DM, HTN, HLD, venous insufficiency who is being seen today for the evaluation of preop examination at the request of Garnette Gunner, MD. he presents for preoperative assessment.  No prior cardiac history.  Apparently labs were drawn by his primary care physician.  A BNP value of 187 was reported.  He actually reports no symptoms of heart failure.  He does have chronic venous insufficiency and this is treated with leg elevation and Lasix.  He has never had a heart attack or stroke.  His CV exam is normal.  His EKG does show right bundle branch block.  He had an echo last year through [facility] that was normal.  He is without any symptoms of chest pain or shortness of breath.  He will have right shoulder surgery.  He tells me he can walk around [employer] or [employer] without any major limitations.  He may take a break.  He can climb a flight of stairs.  He reports balance is a bigger issue for him than reduction in cardiopulmonary effort.  He has high blood pressure that is controlled.  He is diabetic.  He is on a statin.  Cholesterol at goal.  CV exam unremarkable except for venous insufficiency.  He is a never smoker.  No alcohol or drug use.  He is retired [employer].  He presents with his wife.  They do not have children.  No symptoms reported in office today.  Echo 11/20/2021  EF 55-60% [facility])  Cr 1.09 TSH 1.04  Problem List DM -A1c 8.7 2. HTN 3. HLD -T chol 108, TG 112, HDL 43, LDL 41 4. Venous Insufficiency  5. RBBB  Past Medical History: Past Medical History:  Diagnosis Date   Acute bronchitis     COPD (chronic obstructive pulmonary disease) (HCC)    Diabetes mellitus    Edema of left lower extremity 07/03/2012   venous doppler-no evidence of thrombus or thrombophlebitis,left gsv valvular insufficency throughout the vein,left SSV appeared patent wiyh no venous insuffiency, left popliteal area questionable rupture Baker's cyst this was considered a abnormal doppler   GERD (gastroesophageal reflux disease)    History of back injury 07/01/1988   L 4 to , no surgery healed on own   Hypertension 12/06/2011   echo-EF 65-70%    Left ventricular hypertrophy    Melanoma (HCC)    Vitamin D deficiency     Past Surgical History: Past Surgical History:  Procedure Laterality Date   ANKLE SURGERY Left 07/01/1988   APPENDECTOMY  age 3   BRAIN SURGERY  07/01/1993   concusion   HERNIA REPAIR     x 2   KNEE ARTHROSCOPY Left 11/18/2012   Procedure: LEFT ARTHROSCOPY KNEE WITH DEBRIDEMENT AND CHRONDROLPLASTY;  Surgeon: Loanne Drilling, MD;  Location: [facility];  Service: Orthopedics;  Laterality: Left;   KNEE SURGERY     NASAL SINUS SURGERY Bilateral    to open up ear tubes    Current Medications: Current Meds  Medication Sig  acetaminophen (TYLENOL) 650 MG CR tablet Take 650 mg by mouth every 8 (eight) hours as needed for pain.   Ascorbic Acid (VITAMIN C) 1000 MG tablet Take 1,000 mg by mouth.   baclofen (LIORESAL) 10 MG tablet Take 10 mg by mouth 2 (two) times daily.   Biotin 5000 MCG CAPS Take 1 capsule by mouth daily.   calcium citrate-vitamin D (CITRACAL+D) 315-200 MG-UNIT per tablet Take 1 tablet by mouth 2 (two) times daily.   Chromium-Cinnamon (CINNAMON PLUS CHROMIUM PO) Take 2-3 capsules by mouth 2 (two) times daily.   ciclopirox (PENLAC) 8 % solution 1 application Externally Once a day for 90 days   clobetasol cream (TEMOVATE) 0.05 % External for 30 Days   Coenzyme Q10 (COQ10) 100 MG CAPS Take 1 capsule by mouth daily.   cyanocobalamin (VITAMIN B12) 1000 MCG tablet Take 1,000 mcg  by mouth daily.   Ergocalciferol 50 MCG (2000 UT) CAPS Take 50 mcg by mouth once a week.   ferrous sulfate 325 (65 FE) MG tablet Take 1 tablet by mouth daily.   furosemide (LASIX) 20 MG tablet Take 1 tablet (20 mg total) by mouth daily. (Patient taking differently: Take 40 mg by mouth daily.)   insulin glargine (LANTUS) 100 UNIT/ML injection Inject 18 Units into the skin at bedtime.   losartan (COZAAR) 50 MG tablet Take 1 tab po daily   magnesium oxide (MAG-OX) 400 MG tablet Take 400 mg by mouth daily.   metFORMIN (GLUMETZA) 500 MG (MOD) 24 hr tablet Take 500 mg by mouth 2 (two) times daily with a meal.   Multiple Vitamin (MULTIVITAMIN WITH MINERALS) TABS Take 1 tablet by mouth daily.   Multiple Vitamins-Minerals (PRESERVISION AREDS) TABS Take 1 tablet by mouth daily.   omeprazole (PRILOSEC) 20 MG capsule Take 1 capsule (20 mg total) by mouth daily.   rosuvastatin (CRESTOR) 40 MG tablet Take 1 tablet (40 mg total) by mouth at bedtime.   tamsulosin (FLOMAX) 0.4 MG CAPS capsule Take 1 capsule (0.4 mg total) by mouth daily.   TRULICITY 0.75 MG/0.5ML SOPN      Allergies:    Sitagliptin, Wound dressing adhesive, and Tape   Social History: Social History   Socioeconomic History   Marital status: Married    Spouse name: Not on file   Number of children: 0   Years of education: Not on file   Highest education level: Not on file  Occupational History   Occupation: Retired [employer]  Tobacco Use   Smoking status: Never   Smokeless tobacco: Never  Vaping Use   Vaping Use: Never used  Substance and Sexual Activity   Alcohol use: No   Drug use: No   Sexual activity: Not on file  Other Topics Concern   Not on file  Social History Narrative   Not on file   Social Determinants of Health   Financial Resource Strain: Not on file  Food Insecurity: Not on file  Transportation Needs: Not on file  Physical Activity: Not on file  Stress: Not on file  Social Connections: Not on file      Family History: The patient's family history is not on file.  ROS:   All other ROS reviewed and negative. Pertinent positives noted in the HPI.     EKGs/Labs/Other Studies Reviewed:   The following studies were personally reviewed by me today:  EKG:  EKG is  ordered today.    EKG Interpretation  Date/Time:  Wednesday December 18 2022 13:30:32  EDT Ventricular Rate:  71 PR Interval:  194 QRS Duration: 138 QT Interval:  404 QTC Calculation: 439 R Axis:   35 Text Interpretation: Normal sinus rhythm Right bundle branch block Confirmed by Lennie Odor [id number]) on 12/18/2022 1:32:16 PM  LE Venous Duplex 10/15/2022 Summary:  Left:  - No evidence of deep vein thrombosis seen in the left lower extremity,  from the common femoral through the popliteal veins.  - No evidence of superficial venous thrombosis in the left lower  extremity.    - No evidence of superficial venous reflux seen in the left short  saphenous vein.    - Venous reflux is noted in the left sapheno-femoral junction.  - Venous reflux is noted in the left greater saphenous vein in the thigh.  - Venous reflux is noted in the left greater saphenous vein in the calf.  - Venous reflux is noted in the left popliteal vein.   Recent Labs: 02/25/2022: Hemoglobin 13.4; Platelets 245 11/26/2022: ALT 19; BUN 24; Creatinine, Ser 1.09; Potassium 4.5; Pro B Natriuretic peptide (BNP) 187.0; Sodium 140; TSH 1.04   Recent Lipid Panel    Component Value Date/Time   CHOL 108 11/26/2022 0852   TRIG 112.0 11/26/2022 0852   HDL 43.70 11/26/2022 0852   CHOLHDL 2 11/26/2022 0852   VLDL 22.4 11/26/2022 0852   LDLCALC 41 11/26/2022 0852    Physical Exam:   VS:  BP 130/66   Pulse 71   Ht 5\' 10"  (1.778 m)   Wt 204 lb (92.5 kg)   SpO2 97%   BMI 29.27 kg/m    Wt Readings from Last 3 Encounters:  12/18/22 204 lb (92.5 kg)  11/22/22 209 lb 9.6 oz (95.1 kg)  10/15/22 206 lb (93.4 kg)    General: Well nourished, well developed, in  no acute distress Head: Atraumatic, normal size  Eyes: PEERLA, EOMI  Neck: Supple, no JVD Endocrine: No thryomegaly Cardiac: Normal S1, S2; RRR; no murmurs, rubs, or gallops Lungs: Clear to auscultation bilaterally, no wheezing, rhonchi or rales  Abd: Soft, nontender, no hepatomegaly  Ext: No edema, pulses 2+ Musculoskeletal: No deformities, BUE and BLE strength normal and equal Skin: Warm and dry, no rashes   Neuro: Alert and oriented to person, place, time, and situation, CNII-XII grossly intact, no focal deficits  Psych: Normal mood and affect   ASSESSMENT:   DEAMONTAE ROUSER is a 89 y.o. male who presents for the following: 1. Preop cardiovascular exam   2. Essential hypertension   3. Venous stasis of both lower extremities     PLAN:   1. Preop cardiovascular exam -He presents without cardiac complaints.  Can complete greater than 4 METS.  Echo obtained last year was normal.  He did obtain testing from his primary care physician which showed a BNP value of 187.  He has no signs or symptoms of heart failure.  His edema is due to venous insufficiency.  Given his lack of symptoms and ability to complete greater than 4 METS I see no need for further cardiac testing.  He describes nothing concerning for angina.  His EKG shows right bundle branch block but this appears to be similar to prior tracings.  2. Essential hypertension -Well-controlled on current medications.  No changes.  3. Venous stasis of both lower extremities -Recommend leg elevation and compression stockings.  He takes Lasix as well.  Disposition: Return if symptoms worsen or fail to improve.  Medication Adjustments/Labs and Tests Ordered: Current medicines are  reviewed at length with the patient today.  Concerns regarding medicines are outlined above.  Orders Placed This Encounter  Procedures   EKG 12-Lead   No orders of the defined types were placed in this encounter.   Patient Instructions  Medication  Instructions:  The current medical regimen is effective;  continue present plan and medications.  *If you need a refill on your cardiac medications before your next appointment, please call your pharmacy*   Follow-Up: At [facility], you and your health needs are our priority.  As part of our continuing mission to provide you with exceptional heart care, we have created designated Provider Care Teams.  These Care Teams include your primary Cardiologist (physician) and Advanced Practice Providers (APPs -  Physician Assistants and Nurse Practitioners) who all work together to provide you with the care you need, when you need it.  We recommend signing up for the patient portal called "MyChart".  Sign up information is provided on this After Visit Summary.  MyChart is used to connect with patients for Virtual Visits (Telemedicine).  Patients are able to view lab/test results, encounter notes, upcoming appointments, etc.  Non-urgent messages can be sent to your provider as well.   To learn more about what you can do with MyChart, go to [email or web address].    Your next appointment:   As needed  Provider:   Lennie Odor, MD     Signed, Lenna Gilford. Flora Lipps, MD, [facility]  [facility]  [address] [place], [place] [address] [phone]  12/18/2022 2:29 PM

## 2022-12-18 ENCOUNTER — Ambulatory Visit: Payer: Medicare Other | Attending: Cardiovascular Disease | Admitting: Cardiovascular Disease

## 2022-12-18 ENCOUNTER — Encounter: Payer: Self-pay | Admitting: Cardiovascular Disease

## 2022-12-18 VITALS — BP 130/66 | HR 71 | Ht 70.0 in | Wt 204.0 lb

## 2022-12-18 DIAGNOSIS — I10 Essential (primary) hypertension: Secondary | ICD-10-CM | POA: Insufficient documentation

## 2022-12-18 DIAGNOSIS — I878 Other specified disorders of veins: Secondary | ICD-10-CM | POA: Insufficient documentation

## 2022-12-18 DIAGNOSIS — Z01810 Encounter for preprocedural cardiovascular examination: Secondary | ICD-10-CM | POA: Insufficient documentation

## 2022-12-18 NOTE — Patient Instructions (Signed)
Medication Instructions:  The current medical regimen is effective;  continue present plan and medications.  *If you need a refill on your cardiac medications before your next appointment, please call your pharmacy*   Follow-Up: At [facility], you and your health needs are our priority.  As part of our continuing mission to provide you with exceptional heart care, we have created designated Provider Care Teams.  These Care Teams include your primary Cardiologist (physician) and Advanced Practice Providers (APPs -  Physician Assistants and Nurse Practitioners) who all work together to provide you with the care you need, when you need it.  We recommend signing up for the patient portal called "MyChart".  Sign up information is provided on this After Visit Summary.  MyChart is used to connect with patients for Virtual Visits (Telemedicine).  Patients are able to view lab/test results, encounter notes, upcoming appointments, etc.  Non-urgent messages can be sent to your provider as well.   To learn more about what you can do with MyChart, go to [email or web address].    Your next appointment:   As needed  Provider:   Harts O'Neal, MD   

## 2023-01-15 ENCOUNTER — Ambulatory Visit: Payer: Medicare Other | Admitting: Vascular Surgery

## 2023-02-17 ENCOUNTER — Ambulatory Visit (INDEPENDENT_AMBULATORY_CARE_PROVIDER_SITE_OTHER): Payer: Medicare Other

## 2023-02-17 VITALS — BP 122/70 | HR 68 | Temp 97.7°F | Ht 68.5 in | Wt 205.0 lb

## 2023-02-17 DIAGNOSIS — Z0000 Encounter for general adult medical examination without abnormal findings: Secondary | ICD-10-CM | POA: Diagnosis not present

## 2023-02-17 NOTE — Progress Notes (Signed)
Subjective:   Tim Morgan is a 89 y.o. male who presents for Medicare Annual/Subsequent preventive examination.  Visit Complete: In person    Review of Systems     Cardiac Risk Factors include: advanced age (>28men, >86 women);diabetes mellitus;dyslipidemia;hypertension;male gender     Objective:    Today's Vitals   02/17/23 1551  BP: 122/70  Pulse: 68  Temp: 97.7 F (36.5 C)  TempSrc: Oral  SpO2: 97%  Weight: 205 lb (93 kg)  Height: 5' 8.5" (1.74 m)   Body mass index is 30.72 kg/m.     02/17/2023    4:04 PM 11/11/2022    6:45 PM 09/01/2022    7:54 PM 08/29/2022    6:23 PM 02/13/2021    6:53 PM 09/27/2016    4:14 PM 10/18/2015    5:36 PM  Advanced Directives  Does Patient Have a Medical Advance Directive? No No No No No Yes No  Type of Advance Directive      Healthcare Power of Attorney   Would patient like information on creating a medical advance directive? Yes (MAU/Ambulatory/Procedural Areas - Information given)  No - Patient declined No - Patient declined   No - patient declined information    Current Medications (verified) Outpatient Encounter Medications as of 02/17/2023  Medication Sig   Ascorbic Acid (VITAMIN C) 1000 MG tablet Take 1,000 mg by mouth.   baclofen (LIORESAL) 10 MG tablet Take 10 mg by mouth 2 (two) times daily.   Biotin 5000 MCG CAPS Take 1 capsule by mouth daily.   calcium citrate-vitamin D (CITRACAL+D) 315-200 MG-UNIT per tablet Take 1 tablet by mouth 2 (two) times daily.   Chromium-Cinnamon (CINNAMON PLUS CHROMIUM PO) Take 2-3 capsules by mouth 2 (two) times daily.   Coenzyme Q10 (COQ10) 100 MG CAPS Take 1 capsule by mouth daily.   cyanocobalamin (VITAMIN B12) 1000 MCG tablet Take 1,000 mcg by mouth daily.   ferrous sulfate 325 (65 FE) MG tablet Take 1 tablet by mouth daily.   furosemide (LASIX) 20 MG tablet Take 1 tablet (20 mg total) by mouth daily. (Patient taking differently: Take 40 mg by mouth daily.)   insulin glargine (LANTUS) 100  UNIT/ML injection Inject 18 Units into the skin at bedtime.   losartan (COZAAR) 50 MG tablet Take 1 tab po daily   magnesium oxide (MAG-OX) 400 MG tablet Take 400 mg by mouth daily.   metFORMIN (GLUMETZA) 500 MG (MOD) 24 hr tablet Take 500 mg by mouth 2 (two) times daily with a meal.   Multiple Vitamin (MULTIVITAMIN WITH MINERALS) TABS Take 1 tablet by mouth daily.   Multiple Vitamins-Minerals (PRESERVISION AREDS) TABS Take 1 tablet by mouth daily.   omeprazole (PRILOSEC) 20 MG capsule Take 1 capsule (20 mg total) by mouth daily.   rosuvastatin (CRESTOR) 40 MG tablet Take 1 tablet (40 mg total) by mouth at bedtime.   tamsulosin (FLOMAX) 0.4 MG CAPS capsule Take 1 capsule (0.4 mg total) by mouth daily.   TRULICITY 0.75 MG/0.5ML SOPN    acetaminophen (TYLENOL) 650 MG CR tablet Take 650 mg by mouth every 8 (eight) hours as needed for pain. (Patient not taking: Reported on 02/17/2023)   bacitracin ointment Apply 1 Application topically 2 (two) times daily. (Patient not taking: Reported on 10/15/2022)   beclomethasone (QVAR) 80 MCG/ACT inhaler Inhale 2 puffs into the lungs 2 (two) times daily. (Patient not taking: Reported on 10/15/2022)   budesonide-formoterol (SYMBICORT) 160-4.5 MCG/ACT inhaler Inhale 2 puffs into the lungs 2 (  two) times daily. (Patient not taking: Reported on 10/15/2022)   ciclopirox (PENLAC) 8 % solution 1 application Externally Once a day for 90 days (Patient not taking: Reported on 02/17/2023)   clobetasol cream (TEMOVATE) 0.05 % External for 30 Days (Patient not taking: Reported on 02/17/2023)   docusate sodium (COLACE) 100 MG capsule Take 100 mg by mouth at bedtime. (Patient not taking: Reported on 10/15/2022)   Ergocalciferol 50 MCG (2000 UT) CAPS Take 50 mcg by mouth once a week. (Patient not taking: Reported on 02/17/2023)   esomeprazole (NEXIUM) 40 MG capsule Take 40 mg by mouth daily.   (Patient not taking: Reported on 10/15/2022)   famotidine (PEPCID) 20 MG tablet Take 20 mg by  mouth at bedtime. Reported on 10/18/2015 (Patient not taking: Reported on 10/15/2022)   fluticasone (FLONASE) 50 MCG/ACT nasal spray Place 2 sprays into both nostrils daily. (Patient not taking: Reported on 11/22/2022)   hydrOXYzine (ATARAX/VISTARIL) 25 MG tablet Take 25 mg by mouth 3 (three) times daily as needed. (Patient not taking: Reported on 10/15/2022)   insulin aspart (NOVOLOG) 100 UNIT/ML FlexPen Inject 6 Units into the skin 3 (three) times daily with meals. (Patient not taking: Reported on 10/15/2022)   ipratropium-albuterol (DUONEB) 0.5-2.5 (3) MG/3ML SOLN Reported on 10/18/2015 (Patient not taking: Reported on 10/15/2022)   methocarbamol (ROBAXIN) 500 MG tablet Take 500 mg by mouth 2 (two) times daily.  (Patient not taking: Reported on 10/15/2022)   Potassium 99 MG TABS Take 99 mg by mouth daily. (Patient not taking: Reported on 12/18/2022)   Respiratory Therapy Supplies (FLUTTER) DEVI Use as directed (Patient not taking: Reported on 12/18/2022)   sildenafil (VIAGRA) 100 MG tablet Take 100 mg by mouth daily as needed for erectile dysfunction. (Patient not taking: Reported on 10/15/2022)   terbinafine (LAMISIL) 250 MG tablet Take 250 mg by mouth daily. (Patient not taking: Reported on 12/18/2022)   triamcinolone cream (KENALOG) 0.1 % Apply 1 application topically daily as needed. For rash/ itching (Patient not taking: Reported on 12/18/2022)   No facility-administered encounter medications on file as of 02/17/2023.    Allergies (verified) Sitagliptin, Wound dressing adhesive, and Tape   History: Past Medical History:  Diagnosis Date   Acute bronchitis    COPD (chronic obstructive pulmonary disease) (HCC)    Diabetes mellitus    Edema of left lower extremity 07/03/2012   venous doppler-no evidence of thrombus or thrombophlebitis,left gsv valvular insufficency throughout the vein,left SSV appeared patent wiyh no venous insuffiency, left popliteal area questionable rupture Baker's cyst this was  considered a abnormal doppler   GERD (gastroesophageal reflux disease)    History of back injury 07/01/1988   L 4 to , no surgery healed on own   Hypertension 12/06/2011   echo-EF 65-70%    Left ventricular hypertrophy    Melanoma (HCC)    Vitamin D deficiency    Past Surgical History:  Procedure Laterality Date   ANKLE SURGERY Left 07/01/1988   APPENDECTOMY  age 75   BRAIN SURGERY  07/01/1993   concusion   HERNIA REPAIR     x 2   KNEE ARTHROSCOPY Left 11/18/2012   Procedure: LEFT ARTHROSCOPY KNEE WITH DEBRIDEMENT AND CHRONDROLPLASTY;  Surgeon: Loanne Drilling, MD;  Location: [facility];  Service: Orthopedics;  Laterality: Left;   KNEE SURGERY     NASAL SINUS SURGERY Bilateral    to open up ear tubes   History reviewed. No pertinent family history. Social History   Socioeconomic History  Marital status: Married    Spouse name: Not on file   Number of children: 0   Years of education: Not on file   Highest education level: Not on file  Occupational History   Occupation: Retired [employer]  Tobacco Use   Smoking status: Never   Smokeless tobacco: Never  Vaping Use   Vaping status: Never Used  Substance and Sexual Activity   Alcohol use: No   Drug use: No   Sexual activity: Not on file  Other Topics Concern   Not on file  Social History Narrative   Not on file   Social Determinants of Health   Financial Resource Strain: Low Risk  (02/17/2023)   Overall Financial Resource Strain (CARDIA)    Difficulty of Paying Living Expenses: Not hard at all  Food Insecurity: No Food Insecurity (02/17/2023)   Hunger Vital Sign    Worried About Running Out of Food in the Last Year: Never true    Ran Out of Food in the Last Year: Never true  Transportation Needs: No Transportation Needs (02/17/2023)   PRAPARE - Administrator, Civil Service (Medical): No    Lack of Transportation (Non-Medical): No  Physical Activity: Inactive (02/17/2023)   Exercise Vital Sign    Days  of Exercise per Week: 0 days    Minutes of Exercise per Session: 0 min  Stress: No Stress Concern Present (02/17/2023)   [employer] of Occupational Health - Occupational Stress Questionnaire    Feeling of Stress : Not at all  Social Connections: Moderately Integrated (02/17/2023)   Social Connection and Isolation Panel [NHANES]    Frequency of Communication with Friends and Family: More than three times a week    Frequency of Social Gatherings with Friends and Family: Twice a week    Attends Religious Services: More than 4 times per year    Active Member of [employer] or Organizations: No    Attends Engineer, structural: Never    Marital Status: Married    Tobacco Counseling Counseling given: Not Answered   Clinical Intake:  Pre-visit preparation completed: Yes  Pain : No/denies pain     Nutritional Status: BMI > 30  Obese Nutritional Risks: None Diabetes: Yes CBG done?: No Did pt. bring in CBG monitor from home?: No  How often do you need to have someone help you when you read instructions, pamphlets, or other written materials from your doctor or pharmacy?: 1 - Never  Interpreter Needed?: No  Information entered by :: NAllen LPN   Activities of Daily Living    02/17/2023    3:53 PM  In your present state of health, do you have any difficulty performing the following activities:  Hearing? 1  Comment has hearing aids  Vision? 0  Difficulty concentrating or making decisions? 0  Walking or climbing stairs? 1  Comment needs to use bannister  Dressing or bathing? 0  Doing errands, shopping? 0  Preparing Food and eating ? N  Using the Toilet? N  In the past six months, have you accidently leaked urine? N  Do you have problems with loss of bowel control? N  Managing your Medications? N  Managing your Finances? N  Housekeeping or managing your Housekeeping? N    Patient Care Team: Garnette Gunner, MD as PCP - General (Family Medicine) Bjorn Pippin,  MD as Consulting Physician (Orthopedic Surgery)  Indicate any recent Medical Services you may have received from other than [facility]  providers in the past year (date may be approximate).     Assessment:   This is a routine wellness examination for Tim Morgan.  Hearing/Vision screen Hearing Screening - Comments:: Has hearing aids Vision Screening - Comments:: No regular eye exams, Dr. Hyacinth Meeker  Dietary issues and exercise activities discussed:     Goals Addressed             This Visit's Progress    Patient Stated       02/17/2023, live to 100       Depression Screen    02/17/2023    4:06 PM 09/18/2022    9:40 AM  PHQ 2/9 Scores  PHQ - 2 Score 0 0  PHQ- 9 Score 0     Fall Risk    02/17/2023    4:05 PM  Fall Risk   Falls in the past year? 1  Number falls in past yr: 0  Injury with Fall? 1  Comment hurt right shoulder  Risk for fall due to : Impaired balance/gait;Impaired mobility;Medication side effect  Follow up Falls prevention discussed;Falls evaluation completed    MEDICARE RISK AT HOME: Medicare Risk at Home Any stairs in or around the home?: No If so, are there any without handrails?: No Home free of loose throw rugs in walkways, pet beds, electrical cords, etc?: Yes Adequate lighting in your home to reduce risk of falls?: Yes Life alert?: No Use of a cane, walker or w/c?: No Grab bars in the bathroom?: No Shower chair or bench in shower?: Yes Elevated toilet seat or a handicapped toilet?: Yes  TIMED UP AND GO:  Was the test performed?  Yes  Length of time to ambulate 10 feet: 6 sec Gait slow and steady without use of assistive device    Cognitive Function:        02/17/2023    4:07 PM  6CIT Screen  What Year? 0 points  What month? 0 points  What time? 0 points  Count back from 20 0 points  Months in reverse 4 points  Repeat phrase 4 points  Total Score 8 points    Immunizations Immunization History  Administered Date(s) Administered    Influenza-Unspecified 07/01/2021   PFIZER(Purple Top)SARS-COV-2 Vaccination 12/23/2019, 01/13/2020   Tdap 09/27/2016   Zoster Recombinant(Shingrix) 07/22/2018, 01/28/2019    TDAP status: Up to date  Flu Vaccine status: Due, Education has been provided regarding the importance of this vaccine. Advised may receive this vaccine at local pharmacy or Health Dept. Aware to provide a copy of the vaccination record if obtained from local pharmacy or Health Dept. Verbalized acceptance and understanding.  Pneumococcal vaccine status: Due, Education has been provided regarding the importance of this vaccine. Advised may receive this vaccine at local pharmacy or Health Dept. Aware to provide a copy of the vaccination record if obtained from local pharmacy or Health Dept. Verbalized acceptance and understanding.  Covid-19 vaccine status: Information provided on how to obtain vaccines.   Qualifies for Shingles Vaccine? Yes   Zostavax completed Yes   Shingrix Completed?: Yes  Screening Tests Health Maintenance  Topic Date Due   FOOT EXAM  Never done   Pneumonia Vaccine 53+ Years old (1 of 1 - PCV) Never done   COVID-19 Vaccine (4 - 2023-24 season) 03/01/2022   OPHTHALMOLOGY EXAM  04/25/2022   INFLUENZA VACCINE  01/30/2023   HEMOGLOBIN A1C  05/29/2023   Medicare Annual Wellness (AWV)  02/17/2024   DTaP/Tdap/Td (2 - Td or Tdap) 09/28/2026  Zoster Vaccines- Shingrix  Completed   HPV VACCINES  Aged Out    Health Maintenance  Health Maintenance Due  Topic Date Due   FOOT EXAM  Never done   Pneumonia Vaccine 19+ Years old (1 of 1 - PCV) Never done   COVID-19 Vaccine (4 - 2023-24 season) 03/01/2022   OPHTHALMOLOGY EXAM  04/25/2022   INFLUENZA VACCINE  01/30/2023    Colorectal cancer screening: No longer required.   Lung Cancer Screening: (Low Dose CT Chest recommended if Age 67-80 years, 20 pack-year currently smoking OR have quit w/in 15years.) does not qualify.   Lung Cancer Screening  Referral: no  Additional Screening:  Hepatitis C Screening: does not qualify;   Vision Screening: Recommended annual ophthalmology exams for early detection of glaucoma and other disorders of the eye. Is the patient up to date with their annual eye exam?  No  Who is the provider or what is the name of the office in which the patient attends annual eye exams? Dr. Ashley Royalty If pt is not established with a provider, would they like to be referred to a provider to establish care? No .   Dental Screening: Recommended annual dental exams for proper oral hygiene  Diabetic Foot Exam: Diabetic Foot Exam: Overdue, Pt has been advised about the importance in completing this exam. Pt is scheduled for diabetic foot exam on next appointment.  Community Resource Referral / Chronic Care Management: CRR required this visit?  No   CCM required this visit?  No     Plan:     I have personally reviewed and noted the following in the patient's chart:   Medical and social history Use of alcohol, tobacco or illicit drugs  Current medications and supplements including opioid prescriptions. Patient is not currently taking opioid prescriptions. Functional ability and status Nutritional status Physical activity Advanced directives List of other physicians Hospitalizations, surgeries, and ER visits in previous 12 months Vitals Screenings to include cognitive, depression, and falls Referrals and appointments  In addition, I have reviewed and discussed with patient certain preventive protocols, quality metrics, and best practice recommendations. A written personalized care plan for preventive services as well as general preventive health recommendations were provided to patient.     Barb Merino, LPN   1/32/4401   After Visit Summary: (MyChart) Due to this being a telephonic visit, the after visit summary with patients personalized plan was offered to patient via MyChart   Nurse Notes:  none

## 2023-02-17 NOTE — Patient Instructions (Signed)
Tim Morgan , Thank you for taking time to come for your Medicare Wellness Visit. I appreciate your ongoing commitment to your health goals. Please review the following plan we discussed and let me know if I can assist you in the future.   Referrals/Orders/Follow-Ups/Clinician Recommendations: none  This is a list of the screening recommended for you and due dates:  Health Maintenance  Topic Date Due   Complete foot exam   Never done   Pneumonia Vaccine (1 of 1 - PCV) Never done   COVID-19 Vaccine (4 - 2023-24 season) 03/01/2022   Eye exam for diabetics  04/25/2022   Flu Shot  01/30/2023   Hemoglobin A1C  05/29/2023   Medicare Annual Wellness Visit  02/17/2024   DTaP/Tdap/Td vaccine (2 - Td or Tdap) 09/28/2026   Zoster (Shingles) Vaccine  Completed   HPV Vaccine  Aged Out    Advanced directives: (Provided) Advance directive discussed with you today. I have provided a copy for you to complete at home and have notarized. Once this is complete, please bring a copy in to our office so we can scan it into your chart.   Next Medicare Annual Wellness Visit scheduled for next year: Yes  Preventive Care 65 Years and Older, Male  Preventive care refers to lifestyle choices and visits with your health care provider that can promote health and wellness. What does preventive care include? A yearly physical exam. This is also called an annual well check. Dental exams once or twice a year. Routine eye exams. Ask your health care provider how often you should have your eyes checked. Personal lifestyle choices, including: Daily care of your teeth and gums. Regular physical activity. Eating a healthy diet. Avoiding tobacco and drug use. Limiting alcohol use. Practicing safe sex. Taking low doses of aspirin every day. Taking vitamin and mineral supplements as recommended by your health care provider. What happens during an annual well check? The services and screenings done by your health care  provider during your annual well check will depend on your age, overall health, lifestyle risk factors, and family history of disease. Counseling  Your health care provider may ask you questions about your: Alcohol use. Tobacco use. Drug use. Emotional well-being. Home and relationship well-being. Sexual activity. Eating habits. History of falls. Memory and ability to understand (cognition). Work and work Astronomer. Screening  You may have the following tests or measurements: Height, weight, and BMI. Blood pressure. Lipid and cholesterol levels. These may be checked every 5 years, or more frequently if you are over 50 years old. Skin check. Lung cancer screening. You may have this screening every year starting at age 33 if you have a 30-pack-year history of smoking and currently smoke or have quit within the past 15 years. Fecal occult blood test (FOBT) of the stool. You may have this test every year starting at age 38. Flexible sigmoidoscopy or colonoscopy. You may have a sigmoidoscopy every 5 years or a colonoscopy every 10 years starting at age 25. Prostate cancer screening. Recommendations will vary depending on your family history and other risks. Hepatitis C blood test. Hepatitis B blood test. Sexually transmitted disease (STD) testing. Diabetes screening. This is done by checking your blood sugar (glucose) after you have not eaten for a while (fasting). You may have this done every 1-3 years. Abdominal aortic aneurysm (AAA) screening. You may need this if you are a current or former smoker. Osteoporosis. You may be screened starting at age 53 if you are  at high risk. Talk with your health care provider about your test results, treatment options, and if necessary, the need for more tests. Vaccines  Your health care provider may recommend certain vaccines, such as: Influenza vaccine. This is recommended every year. Tetanus, diphtheria, and acellular pertussis (Tdap, Td)  vaccine. You may need a Td booster every 10 years. Zoster vaccine. You may need this after age 54. Pneumococcal 13-valent conjugate (PCV13) vaccine. One dose is recommended after age 63. Pneumococcal polysaccharide (PPSV23) vaccine. One dose is recommended after age 69. Talk to your health care provider about which screenings and vaccines you need and how often you need them. This information is not intended to replace advice given to you by your health care provider. Make sure you discuss any questions you have with your health care provider. Document Released: 07/14/2015 Document Revised: 03/06/2016 Document Reviewed: 04/18/2015 Elsevier Interactive Patient Education  2017 [employer].  Fall Prevention in the Home Falls can cause injuries. They can happen to people of all ages. There are many things you can do to make your home safe and to help prevent falls. What can I do on the outside of my home? Regularly fix the edges of walkways and driveways and fix any cracks. Remove anything that might make you trip as you walk through a door, such as a raised step or threshold. Trim any bushes or trees on the path to your home. Use bright outdoor lighting. Clear any walking paths of anything that might make someone trip, such as rocks or tools. Regularly check to see if handrails are loose or broken. Make sure that both sides of any steps have handrails. Any raised decks and porches should have guardrails on the edges. Have any leaves, snow, or ice cleared regularly. Use sand or salt on walking paths during winter. Clean up any spills in your garage right away. This includes oil or grease spills. What can I do in the bathroom? Use night lights. Install grab bars by the toilet and in the tub and shower. Do not use towel bars as grab bars. Use non-skid mats or decals in the tub or shower. If you need to sit down in the shower, use a plastic, non-slip stool. Keep the floor dry. Clean up any  water that spills on the floor as soon as it happens. Remove soap buildup in the tub or shower regularly. Attach bath mats securely with double-sided non-slip rug tape. Do not have throw rugs and other things on the floor that can make you trip. What can I do in the bedroom? Use night lights. Make sure that you have a light by your bed that is easy to reach. Do not use any sheets or blankets that are too big for your bed. They should not hang down onto the floor. Have a firm chair that has side arms. You can use this for support while you get dressed. Do not have throw rugs and other things on the floor that can make you trip. What can I do in the kitchen? Clean up any spills right away. Avoid walking on wet floors. Keep items that you use a lot in easy-to-reach places. If you need to reach something above you, use a strong step stool that has a grab bar. Keep electrical cords out of the way. Do not use floor polish or wax that makes floors slippery. If you must use wax, use non-skid floor wax. Do not have throw rugs and other things on the floor  that can make you trip. What can I do with my stairs? Do not leave any items on the stairs. Make sure that there are handrails on both sides of the stairs and use them. Fix handrails that are broken or loose. Make sure that handrails are as long as the stairways. Check any carpeting to make sure that it is firmly attached to the stairs. Fix any carpet that is loose or worn. Avoid having throw rugs at the top or bottom of the stairs. If you do have throw rugs, attach them to the floor with carpet tape. Make sure that you have a light switch at the top of the stairs and the bottom of the stairs. If you do not have them, ask someone to add them for you. What else can I do to help prevent falls? Wear shoes that: Do not have high heels. Have rubber bottoms. Are comfortable and fit you well. Are closed at the toe. Do not wear sandals. If you use a  stepladder: Make sure that it is fully opened. Do not climb a closed stepladder. Make sure that both sides of the stepladder are locked into place. Ask someone to hold it for you, if possible. Clearly mark and make sure that you can see: Any grab bars or handrails. First and last steps. Where the edge of each step is. Use tools that help you move around (mobility aids) if they are needed. These include: Canes. Walkers. Scooters. Crutches. Turn on the lights when you go into a dark area. Replace any light bulbs as soon as they burn out. Set up your furniture so you have a clear path. Avoid moving your furniture around. If any of your floors are uneven, fix them. If there are any pets around you, be aware of where they are. Review your medicines with your doctor. Some medicines can make you feel dizzy. This can increase your chance of falling. Ask your doctor what other things that you can do to help prevent falls. This information is not intended to replace advice given to you by your health care provider. Make sure you discuss any questions you have with your health care provider. Document Released: 04/13/2009 Document Revised: 11/23/2015 Document Reviewed: 07/22/2014 Elsevier Interactive Patient Education  2017 [employer].

## 2023-02-20 ENCOUNTER — Encounter (INDEPENDENT_AMBULATORY_CARE_PROVIDER_SITE_OTHER): Payer: Medicare Other | Admitting: Ophthalmology

## 2023-02-20 DIAGNOSIS — Z794 Long term (current) use of insulin: Secondary | ICD-10-CM

## 2023-02-20 DIAGNOSIS — I10 Essential (primary) hypertension: Secondary | ICD-10-CM | POA: Diagnosis not present

## 2023-02-20 DIAGNOSIS — E113293 Type 2 diabetes mellitus with mild nonproliferative diabetic retinopathy without macular edema, bilateral: Secondary | ICD-10-CM | POA: Diagnosis not present

## 2023-02-20 DIAGNOSIS — Z7984 Long term (current) use of oral hypoglycemic drugs: Secondary | ICD-10-CM | POA: Diagnosis not present

## 2023-02-20 DIAGNOSIS — H43821 Vitreomacular adhesion, right eye: Secondary | ICD-10-CM

## 2023-02-20 DIAGNOSIS — H26493 Other secondary cataract, bilateral: Secondary | ICD-10-CM

## 2023-02-20 DIAGNOSIS — H35033 Hypertensive retinopathy, bilateral: Secondary | ICD-10-CM

## 2023-02-22 ENCOUNTER — Emergency Department (HOSPITAL_BASED_OUTPATIENT_CLINIC_OR_DEPARTMENT_OTHER): Payer: Medicare Other

## 2023-02-22 ENCOUNTER — Other Ambulatory Visit: Payer: Self-pay

## 2023-02-22 ENCOUNTER — Emergency Department (HOSPITAL_BASED_OUTPATIENT_CLINIC_OR_DEPARTMENT_OTHER)
Admission: EM | Admit: 2023-02-22 | Discharge: 2023-02-22 | Disposition: A | Discharge status: Home / Self Care | Payer: Medicare Other | Attending: Emergency Medicine | Admitting: Emergency Medicine

## 2023-02-22 ENCOUNTER — Encounter (HOSPITAL_BASED_OUTPATIENT_CLINIC_OR_DEPARTMENT_OTHER): Payer: Self-pay

## 2023-02-22 DIAGNOSIS — Z85820 Personal history of malignant melanoma of skin: Secondary | ICD-10-CM | POA: Diagnosis not present

## 2023-02-22 DIAGNOSIS — U071 COVID-19: Secondary | ICD-10-CM | POA: Insufficient documentation

## 2023-02-22 DIAGNOSIS — R051 Acute cough: Secondary | ICD-10-CM

## 2023-02-22 DIAGNOSIS — E119 Type 2 diabetes mellitus without complications: Secondary | ICD-10-CM | POA: Insufficient documentation

## 2023-02-22 DIAGNOSIS — J449 Chronic obstructive pulmonary disease, unspecified: Secondary | ICD-10-CM | POA: Diagnosis not present

## 2023-02-22 DIAGNOSIS — I10 Essential (primary) hypertension: Secondary | ICD-10-CM | POA: Insufficient documentation

## 2023-02-22 DIAGNOSIS — Z79899 Other long term (current) drug therapy: Secondary | ICD-10-CM | POA: Diagnosis not present

## 2023-02-22 DIAGNOSIS — R059 Cough, unspecified: Secondary | ICD-10-CM | POA: Diagnosis present

## 2023-02-22 DIAGNOSIS — Z794 Long term (current) use of insulin: Secondary | ICD-10-CM | POA: Insufficient documentation

## 2023-02-22 LAB — SARS CORONAVIRUS 2 BY RT PCR: SARS Coronavirus 2 by RT PCR: POSITIVE — AB

## 2023-02-22 NOTE — Discharge Instructions (Addendum)
You tested positive for COVID today.  No pneumonia shown on chest Xray. Please rest, stay hydrated, wear a mask when around other people.  If you start developing cold symptoms, you can try DayQuil/NyQuil or Mucinex or TheraFlu for symptom relief. Please do not hesitate to return to emergency department if worrisome signs symptoms we discussed become apparent.

## 2023-02-22 NOTE — ED Provider Notes (Signed)
[facility] Provider Note   CSN: [id number] Arrival date & time: 02/22/23  1355     History  Chief Complaint  Patient presents with   Cough    Tim Morgan is a 89 y.o. male history of COPD, diabetes presents today for evaluation of fever and cough, weakness.  Symptoms started yesterday.  Family reports fever of 101 at home.  Nonproductive cough.  Denies chest pain, shortness of breath, nausea, vomiting, abdominal pain, bowel changes, urinary symptoms, blood in his stool or urine.   Cough   Past Medical History:  Diagnosis Date   Acute bronchitis    COPD (chronic obstructive pulmonary disease) (HCC)    Diabetes mellitus    Edema of left lower extremity 07/03/2012   venous doppler-no evidence of thrombus or thrombophlebitis,left gsv valvular insufficency throughout the vein,left SSV appeared patent wiyh no venous insuffiency, left popliteal area questionable rupture Baker's cyst this was considered a abnormal doppler   GERD (gastroesophageal reflux disease)    History of back injury 07/01/1988   L 4 to , no surgery healed on own   Hypertension 12/06/2011   echo-EF 65-70%    Left ventricular hypertrophy    Melanoma (HCC)    Vitamin D deficiency    Past Surgical History:  Procedure Laterality Date   ANKLE SURGERY Left 07/01/1988   APPENDECTOMY  age 89   BRAIN SURGERY  07/01/1993   concusion   CATARACT EXTRACTION Right 04/03/2021   CATARACT EXTRACTION Left 05/01/2021   HERNIA REPAIR     x 2   KNEE ARTHROSCOPY Left 11/18/2012   Procedure: LEFT ARTHROSCOPY KNEE WITH DEBRIDEMENT AND CHRONDROLPLASTY;  Surgeon: Loanne Drilling, MD;  Location: [facility];  Service: Orthopedics;  Laterality: Left;   KNEE SURGERY     NASAL SINUS SURGERY Bilateral    to open up ear tubes     Home Medications Prior to Admission medications   Medication Sig Start Date End Date Taking? Authorizing Provider  acetaminophen (TYLENOL) 650 MG CR tablet  Take 650 mg by mouth every 8 (eight) hours as needed for pain. Patient not taking: Reported on 02/17/2023    [provider]  Ascorbic Acid (VITAMIN C) 1000 MG tablet Take 1,000 mg by mouth. 10/05/15   [provider]  bacitracin ointment Apply 1 Application topically 2 (two) times daily. Patient not taking: Reported on 10/15/2022 09/01/22   Virgina Norfolk, DO  baclofen (LIORESAL) 10 MG tablet Take 10 mg by mouth 2 (two) times daily. 11/06/22   [provider]  beclomethasone (QVAR) 80 MCG/ACT inhaler Inhale 2 puffs into the lungs 2 (two) times daily. Patient not taking: Reported on 10/15/2022    [provider]  Biotin 5000 MCG CAPS Take 1 capsule by mouth daily. 02/18/22   [provider]  budesonide-formoterol (SYMBICORT) 160-4.5 MCG/ACT inhaler Inhale 2 puffs into the lungs 2 (two) times daily. Patient not taking: Reported on 10/15/2022    [provider]  calcium citrate-vitamin D (CITRACAL+D) 315-200 MG-UNIT per tablet Take 1 tablet by mouth 2 (two) times daily.    [provider]  Chromium-Cinnamon (CINNAMON PLUS CHROMIUM PO) Take 2-3 capsules by mouth 2 (two) times daily.    [provider]  ciclopirox (PENLAC) 8 % solution 1 application Externally Once a day for 90 days Patient not taking: Reported on 02/17/2023    [provider]  clobetasol cream (TEMOVATE) 0.05 % External for 30 Days Patient not taking: Reported  on 02/17/2023    [provider]  Coenzyme Q10 (COQ10) 100 MG CAPS Take 1 capsule by mouth daily.    [provider]  cyanocobalamin (VITAMIN B12) 1000 MCG tablet Take 1,000 mcg by mouth daily.    [provider]  docusate sodium (COLACE) 100 MG capsule Take 100 mg by mouth at bedtime. Patient not taking: Reported on 10/15/2022    [provider]  Ergocalciferol 50 MCG (2000 UT) CAPS Take 50 mcg by mouth once a week. Patient not taking: Reported on 02/17/2023 02/18/22    [provider]  esomeprazole (NEXIUM) 40 MG capsule Take 40 mg by mouth daily.   Patient not taking: Reported on 10/15/2022    [provider]  famotidine (PEPCID) 20 MG tablet Take 20 mg by mouth at bedtime. Reported on 10/18/2015 Patient not taking: Reported on 10/15/2022 07/09/14   [provider]  ferrous sulfate 325 (65 FE) MG tablet Take 1 tablet by mouth daily. 10/05/15   [provider]  fluticasone (FLONASE) 50 MCG/ACT nasal spray Place 2 sprays into both nostrils daily. Patient not taking: Reported on 11/22/2022    [provider]  furosemide (LASIX) 20 MG tablet Take 1 tablet (20 mg total) by mouth daily. Patient taking differently: Take 40 mg by mouth daily. 10/02/22   [name] B, FNP  hydrOXYzine (ATARAX/VISTARIL) 25 MG tablet Take 25 mg by mouth 3 (three) times daily as needed. Patient not taking: Reported on 10/15/2022 06/13/14   [provider]  insulin aspart (NOVOLOG) 100 UNIT/ML FlexPen Inject 6 Units into the skin 3 (three) times daily with meals. Patient not taking: Reported on 10/15/2022    [provider]  insulin glargine (LANTUS) 100 UNIT/ML injection Inject 18 Units into the skin at bedtime.    [provider]  ipratropium-albuterol (DUONEB) 0.5-2.5 (3) MG/3ML SOLN Reported on 10/18/2015 Patient not taking: Reported on 10/15/2022 05/03/14   [provider]  losartan (COZAAR) 50 MG tablet Take 1 tab po daily 10/02/22   [name] B, FNP  magnesium oxide (MAG-OX) 400 MG tablet Take 400 mg by mouth daily.    [provider]  metFORMIN (GLUMETZA) 500 MG (MOD) 24 hr tablet Take 500 mg by mouth 2 (two) times daily with a meal.    [provider]  methocarbamol (ROBAXIN) 500 MG tablet Take 500 mg by mouth 2 (two) times daily.  Patient not taking: Reported on 10/15/2022    [provider]  Multiple Vitamin (MULTIVITAMIN WITH MINERALS) TABS Take 1 tablet by mouth daily.     [provider]  Multiple Vitamins-Minerals (PRESERVISION AREDS) TABS Take 1 tablet by mouth daily.    [provider]  omeprazole (PRILOSEC) 20 MG capsule Take 1 capsule (20 mg total) by mouth daily. 10/02/22   Eulis Foster, FNP  Potassium 99 MG TABS Take 99 mg by mouth daily. Patient not taking: Reported on 12/18/2022    [provider]  Respiratory Therapy Supplies (FLUTTER) DEVI Use as directed Patient not taking: Reported on 12/18/2022 10/03/14   Nyoka Cowden, MD  rosuvastatin (CRESTOR) 40 MG tablet Take 1 tablet (40 mg total) by mouth at bedtime. 10/02/22   Eulis Foster, FNP  sildenafil (VIAGRA) 100 MG tablet Take 100 mg by mouth daily as needed for erectile dysfunction. Patient not taking: Reported on 10/15/2022    [provider]  tamsulosin (FLOMAX) 0.4 MG CAPS capsule Take 1 capsule (0.4 mg total) by mouth daily. 10/02/22  [name] B, FNP  terbinafine (LAMISIL) 250 MG tablet Take 250 mg by mouth daily. Patient not taking: Reported on 12/18/2022    [provider]  triamcinolone cream (KENALOG) 0.1 % Apply 1 application topically daily as needed. For rash/ itching Patient not taking: Reported on 12/18/2022    [provider]  TRULICITY 0.75 MG/0.5ML SOPN  10/12/19   [provider]      Allergies    Sitagliptin, Wound dressing adhesive, and Tape    Review of Systems   Review of Systems  Respiratory:  Positive for cough.     Physical Exam Updated Vital Signs BP 104/70 (BP Location: Left Arm)   Pulse 72   Temp 98.3 F (36.8 C) (Oral)   Resp 20   Ht 5' 8.5" (1.74 m)   Wt 92.1 kg   SpO2 98%   BMI 30.42 kg/m  Physical Exam Vitals and nursing note reviewed.  Constitutional:      Appearance: Normal appearance.  HENT:     Head: Normocephalic and atraumatic.     Mouth/Throat:     Mouth: Mucous membranes are moist.  Eyes:     General: No scleral icterus. Cardiovascular:     Rate and Rhythm: Normal rate and  regular rhythm.     Pulses: Normal pulses.     Heart sounds: Normal heart sounds.  Pulmonary:     Effort: Pulmonary effort is normal.     Breath sounds: Normal breath sounds.  Abdominal:     General: Abdomen is flat.     Palpations: Abdomen is soft.     Tenderness: There is no abdominal tenderness.  Musculoskeletal:        General: No deformity.  Skin:    General: Skin is warm.     Findings: No rash.  Neurological:     General: No focal deficit present.     Mental Status: He is alert.  Psychiatric:        Mood and Affect: Mood normal.    ED Results / Procedures / Treatments   Labs (all labs ordered are listed, but only abnormal results are displayed) Labs Reviewed  SARS CORONAVIRUS 2 BY RT PCR    EKG None  Radiology No results found.  Procedures Procedures    Medications Ordered in ED Medications - No data to display  ED Course/ Medical Decision Making/ A&P                                 Medical Decision Making Amount and/or Complexity of Data Reviewed Radiology: ordered.   This patient presents to the ED for sore throat, body aches, this involves an extensive number of treatment options, and is a complaint that carries with a high risk of complications and morbidity.  The differential diagnosis includes flu, COVID, RSV, strep, pharyngitis, bronchitis, pneumonia, infectious etiology.  This is not an exhaustive list.  Lab tests: I ordered and personally interpreted labs.  The pertinent results include: Viral panel positive for COVID.  Imaging studies: Negative chest x-ray.  Problem list/ ED course/ Critical interventions/ Medical management: HPI: See above Vital signs within normal range and stable throughout visit. Laboratory/imaging studies significant for: See above. On physical examination, patient is afebrile and appears in no acute distress. This patient presents with symptoms suspicious for COVID. Based on history and physical doubt sinusitis.  COVID test was positive. Do not suspect underlying cardiopulmonary process. I considered,  but think unlikely, pneumonia. Patient is nontoxic appearing and not in need of emergent medical intervention. Patient told to self isolate at home until symptoms subside for 72 hours. Recommended patient to take TheraFlu or Mucinex for symptom relief.  Follow-up with primary care physician for further evaluation and management.  Return to the ER if new or worsening symptoms. I have reviewed the patient home medicines and have made adjustments as needed.  Cardiac monitoring/EKG: The patient was maintained on a cardiac monitor.  I personally reviewed and interpreted the cardiac monitor which showed an underlying rhythm of: sinus rhythm.  Additional history obtained: External records from outside source obtained and reviewed including: Chart review including previous notes, labs, imaging.  Consultations obtained:  Disposition Continued outpatient therapy. Follow-up with PCP recommended for reevaluation of symptoms. Treatment plan discussed with patient.  Pt acknowledged understanding was agreeable to the plan. Worrisome signs and symptoms were discussed with patient, and patient acknowledged understanding to return to the ED if they noticed these signs and symptoms. Patient was stable upon discharge.   This chart was dictated using voice recognition software.  Despite best efforts to proofread,  errors can occur which can change the documentation meaning.          Final Clinical Impression(s) / ED Diagnoses Final diagnoses:  COVID  Acute cough    Rx / DC Orders ED Discharge Orders     None         Jeanelle Malling, [place] 02/22/23 1537    Rolan Bucco, MD 02/23/23 [phone]

## 2023-02-22 NOTE — ED Triage Notes (Signed)
Having cough, weakness fever at home. + home covid test.

## 2023-03-05 ENCOUNTER — Ambulatory Visit: Payer: Medicare Other | Admitting: Family Medicine

## 2023-04-02 ENCOUNTER — Other Ambulatory Visit: Payer: Self-pay | Admitting: Family Medicine

## 2023-04-02 MED ORDER — FUROSEMIDE 20 MG PO TABS: 20.0000 mg | ORAL_TABLET | Freq: Every day | ORAL | 1 refills | Status: DC

## 2023-04-02 NOTE — Telephone Encounter (Signed)
Prescription Request  04/02/2023  LOV: 11/22/2022  What is the name of the medication or equipment? furosemide (LASIX) 20 MG tablet [[id number]] - Pt is requesting for 90 days supply  Have you contacted your pharmacy to request a refill?  No  Which pharmacy would you like this sent to?  [facility] - [place] - [address] [place] [address] Phone: [phone] Fax: [phone]  [facility] - [place], [place] - [address] [place] [address] Phone: [phone] Fax: [phone]    Patient notified that their request is being sent to the clinical staff for review and that they should receive a response within 2 business days.   Please advise at Mobile [phone] (mobile)

## 2023-04-08 NOTE — Telephone Encounter (Signed)
Pt is asking for a script to be sent in for furosemide (LASIX) 20 MG tablet [[id number]] but 40 mg. They stated that it has been 40 mg all along. He is going to double up on the 20 mg until the other is called in.   They want 90 tablets with 3 auto refills. Send to:   [facility] - [place], [place] - [address], [place] [address] Phone: [phone]  Fax: [phone]

## 2023-04-09 ENCOUNTER — Ambulatory Visit (INDEPENDENT_AMBULATORY_CARE_PROVIDER_SITE_OTHER): Payer: Medicare Other | Admitting: Vascular Surgery

## 2023-04-09 ENCOUNTER — Encounter: Payer: Self-pay | Admitting: Vascular Surgery

## 2023-04-09 VITALS — BP 123/76 | HR 68 | Temp 98.1°F | Resp 20 | Ht 68.5 in | Wt 204.0 lb

## 2023-04-09 DIAGNOSIS — I872 Venous insufficiency (chronic) (peripheral): Secondary | ICD-10-CM | POA: Diagnosis not present

## 2023-04-09 NOTE — Progress Notes (Signed)
Patient ID: Tim Morgan, male   DOB: 1932/07/19, 89 y.o.   MRN: [id number]  Reason for Consult: Follow-up   Referred by Garnette Gunner, MD  Subjective:     HPI:  Tim Morgan is a 89 y.o. 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  He does have chronic swelling of the both lower extremities more on the right than left at this time.  Previously had left knee effusions but these have resolved.  He states that it has been a long time for the squamous cell excision site to heal but currently is nearly healed.  He does have redness of his ankles.  Chief complaint is neuropathy of both feet.  He does not wear compression stockings.  Past Medical History:  Diagnosis Date   Acute bronchitis    COPD (chronic obstructive pulmonary disease) (HCC)    Diabetes mellitus    Edema of left lower extremity 07/03/2012   venous doppler-no evidence of thrombus or thrombophlebitis,left gsv valvular insufficency throughout the vein,left SSV appeared patent wiyh no venous insuffiency, left popliteal area questionable rupture Baker's cyst this was considered a abnormal doppler   GERD (gastroesophageal reflux disease)    History of back injury 07/01/1988   L 4 to , no surgery healed on own   Hypertension 12/06/2011   echo-EF 65-70%    Left ventricular hypertrophy    Melanoma (HCC)    Vitamin D deficiency    History reviewed. No pertinent family history. Past Surgical History:  Procedure Laterality Date   ANKLE SURGERY Left 07/01/1988   APPENDECTOMY  age 55   BRAIN SURGERY  07/01/1993   concusion   CATARACT EXTRACTION Right 04/03/2021   CATARACT EXTRACTION Left 05/01/2021   HERNIA REPAIR     x 2   KNEE ARTHROSCOPY Left 11/18/2012   Procedure: LEFT ARTHROSCOPY KNEE WITH DEBRIDEMENT AND CHRONDROLPLASTY;  Surgeon: Loanne Drilling, MD;  Location: [facility];  Service: Orthopedics;  Laterality: Left;   KNEE SURGERY      NASAL SINUS SURGERY Bilateral    to open up ear tubes    Short Social History:  Social History   Tobacco Use   Smoking status: Never   Smokeless tobacco: Never  Substance Use Topics   Alcohol use: No    Allergies  Allergen Reactions   Sitagliptin Hives   Wound Dressing Adhesive Rash   Tape Rash    Current Outpatient Medications  Medication Sig Dispense Refill   acetaminophen (TYLENOL) 650 MG CR tablet Take 650 mg by mouth every 8 (eight) hours as needed for pain.     Ascorbic Acid (VITAMIN C) 1000 MG tablet Take 1,000 mg by mouth.     bacitracin ointment Apply 1 Application topically 2 (two) times daily. 120 g 0   baclofen (LIORESAL) 10 MG tablet Take 10 mg by mouth 2 (two) times daily.     beclomethasone (QVAR) 80 MCG/ACT inhaler Inhale 2 puffs into the lungs 2 (two) times daily.     Biotin 5000 MCG CAPS Take 1 capsule by mouth daily.     budesonide-formoterol (SYMBICORT) 160-4.5 MCG/ACT inhaler Inhale 2 puffs into the lungs 2 (two) times daily.     Chromium-Cinnamon (CINNAMON PLUS CHROMIUM PO) Take 2-3 capsules by mouth 2 (two) times daily.     ciclopirox (PENLAC) 8 % solution      clobetasol cream (TEMOVATE) 0.05 %  Coenzyme Q10 (COQ10) 100 MG CAPS Take 1 capsule by mouth daily.     cyanocobalamin (VITAMIN B12) 1000 MCG tablet Take 1,000 mcg by mouth daily.     docusate sodium (COLACE) 100 MG capsule Take 100 mg by mouth at bedtime.     Ergocalciferol 50 MCG (2000 UT) CAPS Take 50 mcg by mouth once a week.     esomeprazole (NEXIUM) 40 MG capsule Take 40 mg by mouth daily.     famotidine (PEPCID) 20 MG tablet Take 20 mg by mouth at bedtime. Reported on 10/18/2015  2   ferrous sulfate 325 (65 FE) MG tablet Take 1 tablet by mouth daily.     fluticasone (FLONASE) 50 MCG/ACT nasal spray Place 2 sprays into both nostrils daily.     furosemide (LASIX) 20 MG tablet Take 1 tablet (20 mg total) by mouth daily. 90 tablet 1   hydrOXYzine (ATARAX/VISTARIL) 25 MG tablet Take 25 mg  by mouth 3 (three) times daily as needed.  1   insulin aspart (NOVOLOG) 100 UNIT/ML FlexPen Inject 6 Units into the skin 3 (three) times daily with meals.     insulin glargine (LANTUS) 100 UNIT/ML injection Inject 18 Units into the skin at bedtime.     ipratropium-albuterol (DUONEB) 0.5-2.5 (3) MG/3ML SOLN Reported on 10/18/2015  3   losartan (COZAAR) 50 MG tablet Take 1 tab po daily 90 tablet 1   magnesium oxide (MAG-OX) 400 MG tablet Take 400 mg by mouth daily.     metFORMIN (GLUMETZA) 500 MG (MOD) 24 hr tablet Take 500 mg by mouth 2 (two) times daily with a meal.     methocarbamol (ROBAXIN) 500 MG tablet Take 500 mg by mouth 2 (two) times daily.     Multiple Vitamin (MULTIVITAMIN WITH MINERALS) TABS Take 1 tablet by mouth daily.     Multiple Vitamins-Minerals (PRESERVISION AREDS) TABS Take 1 tablet by mouth daily.     omeprazole (PRILOSEC) 20 MG capsule Take 1 capsule (20 mg total) by mouth daily. 90 capsule 1   Potassium 99 MG TABS Take 99 mg by mouth daily.     Respiratory Therapy Supplies (FLUTTER) DEVI Use as directed 1 each 0   rosuvastatin (CRESTOR) 40 MG tablet Take 1 tablet (40 mg total) by mouth at bedtime. 90 tablet 1   sildenafil (VIAGRA) 100 MG tablet Take 100 mg by mouth daily as needed for erectile dysfunction.     tamsulosin (FLOMAX) 0.4 MG CAPS capsule Take 1 capsule (0.4 mg total) by mouth daily. 90 capsule 1   terbinafine (LAMISIL) 250 MG tablet Take 250 mg by mouth daily.     triamcinolone cream (KENALOG) 0.1 % Apply 1 application  topically daily as needed. For rash/ itching     TRULICITY 0.75 MG/0.5ML SOPN      Zinc 50 MG TABS Take by mouth.     calcium citrate-vitamin D (CITRACAL+D) 315-200 MG-UNIT per tablet Take 1 tablet by mouth 2 (two) times daily. (Patient not taking: Reported on 04/09/2023)     No current facility-administered medications for this visit.    Review of Systems  Constitutional:  Constitutional negative. HENT: HENT negative.  Eyes: Eyes negative.   Respiratory: Respiratory negative.  Cardiovascular: Positive for leg swelling.  GI: Gastrointestinal negative.  Musculoskeletal: Positive for leg pain.  Skin: Positive for rash.  Neurological: Positive for numbness.  Hematologic: Hematologic/lymphatic negative.  Psychiatric: Psychiatric negative.        Objective:  Objective   Vitals:   04/09/23  1232  BP: 123/76  Pulse: 68  Resp: 20  Temp: 98.1 F (36.7 C)  SpO2: 96%  Weight: 204 lb (92.5 kg)  Height: 5' 8.5" (1.74 m)   Body mass index is 30.57 kg/m.  Physical Exam HENT:     Head: Normocephalic.     Nose: Nose normal.     Mouth/Throat:     Mouth: Mucous membranes are moist.  Eyes:     Pupils: Pupils are equal, round, and reactive to light.  Cardiovascular:     Pulses: Normal pulses.  Pulmonary:     Effort: Pulmonary effort is normal.  Abdominal:     General: Abdomen is flat.  Musculoskeletal:     Cervical back: Normal range of motion.     Right lower leg: Edema present.     Left lower leg: Edema present.  Skin:    Capillary Refill: Capillary refill takes less than 2 seconds.  Neurological:     General: No focal deficit present.     Mental Status: He is alert.  Psychiatric:        Mood and Affect: Mood normal.        Thought Content: Thought content normal.        Judgment: Judgment normal.     Data: LEFT          Reflux NoRefluxReflux TimeDiameter cmsComments                          Yes                                   +--------------+---------+------+-----------+------------+--------+  CFV          no                                              +--------------+---------+------+-----------+------------+--------+  FV prox       no                                              +--------------+---------+------+-----------+------------+--------+  FV mid        no                                               +--------------+---------+------+-----------+------------+--------+  FV dist       no                                              +--------------+---------+------+-----------+------------+--------+  Popliteal              yes   >1 second                       +--------------+---------+------+-----------+------------+--------+  GSV at SFJ              yes    >500 ms      0.87              +--------------+---------+------+-----------+------------+--------+  GSV prox thigh          yes    >500 ms      0.41              +--------------+---------+------+-----------+------------+--------+  GSV mid thigh           yes    >500 ms      0.29              +--------------+---------+------+-----------+------------+--------+  GSV dist thigh          yes    >500 ms      0.24              +--------------+---------+------+-----------+------------+--------+  GSV at knee   no                            0.20              +--------------+---------+------+-----------+------------+--------+  GSV prox calf           yes    >500 ms      0.32              +--------------+---------+------+-----------+------------+--------+  SSV Pop Fossa no                            0.42              +--------------+---------+------+-----------+------------+--------+  SSV prox calf no                            0.20              +--------------+---------+------+-----------+------------+--------+         Summary:  Left:  - No evidence of deep vein thrombosis seen in the left lower extremity,  from the common femoral through the popliteal veins.  - No evidence of superficial venous thrombosis in the left lower  extremity.    - No evidence of superficial venous reflux seen in the left short  saphenous vein.    - Venous reflux is noted in the left sapheno-femoral junction.  - Venous reflux is noted in the left greater saphenous vein in the thigh.  -  Venous reflux is noted in the left greater saphenous vein in the calf.  - Venous reflux is noted in the left popliteal vein.         Assessment/Plan:     89 year old male with bilateral lower extremity swelling with minimal reflux of his left greater saphenous vein but swelling much improved now that he is not having any left lower extremity effusions.  He also has a recent squamous cell removed from his right ankle and this has healed.  I have recommended continued knee-high compression stockings as tolerated he can see me on an as-needed basis.     Maeola Harman MD [facility]

## 2023-04-13 ENCOUNTER — Emergency Department (HOSPITAL_COMMUNITY)
Admission: EM | Admit: 2023-04-13 | Discharge: 2023-04-13 | Disposition: A | Discharge status: Home / Self Care | Payer: Medicare Other | Attending: Emergency Medicine | Admitting: Emergency Medicine

## 2023-04-13 ENCOUNTER — Emergency Department (HOSPITAL_COMMUNITY): Payer: Medicare Other

## 2023-04-13 ENCOUNTER — Other Ambulatory Visit: Payer: Self-pay

## 2023-04-13 ENCOUNTER — Encounter (HOSPITAL_COMMUNITY): Payer: Self-pay

## 2023-04-13 DIAGNOSIS — W19XXXA Unspecified fall, initial encounter: Secondary | ICD-10-CM | POA: Insufficient documentation

## 2023-04-13 DIAGNOSIS — S0990XA Unspecified injury of head, initial encounter: Secondary | ICD-10-CM | POA: Diagnosis present

## 2023-04-13 DIAGNOSIS — Z794 Long term (current) use of insulin: Secondary | ICD-10-CM | POA: Insufficient documentation

## 2023-04-13 DIAGNOSIS — Z7984 Long term (current) use of oral hypoglycemic drugs: Secondary | ICD-10-CM | POA: Insufficient documentation

## 2023-04-13 DIAGNOSIS — S0003XA Contusion of scalp, initial encounter: Secondary | ICD-10-CM | POA: Diagnosis not present

## 2023-04-13 DIAGNOSIS — M79605 Pain in left leg: Secondary | ICD-10-CM | POA: Insufficient documentation

## 2023-04-13 DIAGNOSIS — E119 Type 2 diabetes mellitus without complications: Secondary | ICD-10-CM | POA: Insufficient documentation

## 2023-04-13 NOTE — ED Triage Notes (Signed)
BIB EMS from home for fall, wife fell onto pt. Pt fell into sledgehammer and hit back of his head. No LOC, no blood thinners.  Left leg pain and left shoulder being sore.

## 2023-04-13 NOTE — ED Notes (Signed)
Pt ambulated to bathroom with standby assistance. Pt has no difficulties ambulating

## 2023-04-13 NOTE — Discharge Instructions (Signed)
You were evaluated in the emergency department for a fall.  You have a contusion of your scalp.  A CT was done of your head that did not show any bleeding or injury inside the brain.  A CT was done of your neck that did not show any broken bones in your neck. Plain x-Jery Hollern of your left shoulder, left hip, and left knee were obtained and no evidence of broken bones were noted. You should return to the emergency department if you are having increased weakness, headache, weakness on one side or the other, or other new symptoms.  Please recheck with your doctor this week

## 2023-04-13 NOTE — ED Provider Notes (Signed)
[place] EMERGENCY DEPARTMENT AT [facility] Provider Note   CSN: [id number] Arrival date & time: 04/13/23  1103     History  No chief complaint on file.   Tim Morgan is a 89 y.o. male.  HPI -year-old male history of diabetes presents today after mechanical fall.  Patient was getting ready to go to church with his wife.  They both bent over to open the hatchback.  She stumbled and fell into him knocking him to the ground.  He fell backwards and struck the back of his head on a sledgehammer.  He did not lose consciousness.  He was unable to get up on his own and a neighbor came over and helped him up.  He had pain in his left medial leg and knee with trying to stand up.  He did stand up on EMS arrival.  They transported him here via EMS without a cervical collar in place.  They report that he had normal vital signs and blood sugar prehospital. Patient is not on any reported blood thinners    Home Medications Prior to Admission medications   Medication Sig Start Date End Date Taking? Authorizing Provider  acetaminophen (TYLENOL) 650 MG CR tablet Take 650 mg by mouth every 8 (eight) hours as needed for pain.    [provider]  Ascorbic Acid (VITAMIN C) 1000 MG tablet Take 1,000 mg by mouth. 10/05/15   [provider]  bacitracin ointment Apply 1 Application topically 2 (two) times daily. 09/01/22   Curatolo, Adam, DO  baclofen (LIORESAL) 10 MG tablet Take 10 mg by mouth 2 (two) times daily. 11/06/22   [provider]  beclomethasone (QVAR) 80 MCG/ACT inhaler Inhale 2 puffs into the lungs 2 (two) times daily.    [provider]  Biotin 5000 MCG CAPS Take 1 capsule by mouth daily. 02/18/22   [provider]  budesonide-formoterol (SYMBICORT) 160-4.5 MCG/ACT inhaler Inhale 2 puffs into the lungs 2 (two) times daily.    [provider]  calcium citrate-vitamin D (CITRACAL+D) 315-200 MG-UNIT per tablet Take 1 tablet by mouth 2  (two) times daily. Patient not taking: Reported on 04/09/2023    [provider]  Chromium-Cinnamon (CINNAMON PLUS CHROMIUM PO) Take 2-3 capsules by mouth 2 (two) times daily.    [provider]  ciclopirox (PENLAC) 8 % solution     [provider]  clobetasol cream (TEMOVATE) 0.05 %     [provider]  Coenzyme Q10 (COQ10) 100 MG CAPS Take 1 capsule by mouth daily.    [provider]  cyanocobalamin (VITAMIN B12) 1000 MCG tablet Take 1,000 mcg by mouth daily.    [provider]  docusate sodium (COLACE) 100 MG capsule Take 100 mg by mouth at bedtime.    [provider]  Ergocalciferol 50 MCG (2000 UT) CAPS Take 50 mcg by mouth once a week. 02/18/22   [provider]  esomeprazole (NEXIUM) 40 MG capsule Take 40 mg by mouth daily.    [provider]  famotidine (PEPCID) 20 MG tablet Take 20 mg by mouth at bedtime. Reported on 10/18/2015 07/09/14   [provider]  ferrous sulfate 325 (65 FE) MG tablet Take 1 tablet by mouth daily. 10/05/15   [provider]  fluticasone (FLONASE) 50 MCG/ACT nasal spray Place 2 sprays into both nostrils daily.    [provider]  furosemide (LASIX) 20 MG tablet Take 1 tablet (20 mg total) by mouth daily.  04/02/23   Garnette Gunner, MD  hydrOXYzine (ATARAX/VISTARIL) 25 MG tablet Take 25 mg by mouth 3 (three) times daily as needed. 06/13/14   [provider]  insulin aspart (NOVOLOG) 100 UNIT/ML FlexPen Inject 6 Units into the skin 3 (three) times daily with meals.    [provider]  insulin glargine (LANTUS) 100 UNIT/ML injection Inject 18 Units into the skin at bedtime.    [provider]  ipratropium-albuterol (DUONEB) 0.5-2.5 (3) MG/3ML SOLN Reported on 10/18/2015 05/03/14   [provider]  losartan (COZAAR) 50 MG tablet Take 1 tab po daily 10/02/22   [name] B, FNP  magnesium oxide (MAG-OX) 400 MG tablet Take 400 mg by  mouth daily.    [provider]  metFORMIN (GLUMETZA) 500 MG (MOD) 24 hr tablet Take 500 mg by mouth 2 (two) times daily with a meal.    [provider]  methocarbamol (ROBAXIN) 500 MG tablet Take 500 mg by mouth 2 (two) times daily.    [provider]  Multiple Vitamin (MULTIVITAMIN WITH MINERALS) TABS Take 1 tablet by mouth daily.    [provider]  Multiple Vitamins-Minerals (PRESERVISION AREDS) TABS Take 1 tablet by mouth daily.    [provider]  omeprazole (PRILOSEC) 20 MG capsule Take 1 capsule (20 mg total) by mouth daily. 10/02/22   Eulis Foster, FNP  Potassium 99 MG TABS Take 99 mg by mouth daily.    [provider]  Respiratory Therapy Supplies (FLUTTER) DEVI Use as directed 10/03/14   Nyoka Cowden, MD  rosuvastatin (CRESTOR) 40 MG tablet Take 1 tablet (40 mg total) by mouth at bedtime. 10/02/22   Eulis Foster, FNP  sildenafil (VIAGRA) 100 MG tablet Take 100 mg by mouth daily as needed for erectile dysfunction.    [provider]  tamsulosin (FLOMAX) 0.4 MG CAPS capsule Take 1 capsule (0.4 mg total) by mouth daily. 10/02/22   Eulis Foster, FNP  terbinafine (LAMISIL) 250 MG tablet Take 250 mg by mouth daily.    [provider]  triamcinolone cream (KENALOG) 0.1 % Apply 1 application  topically daily as needed. For rash/ itching    [provider]  TRULICITY 0.75 MG/0.5ML SOPN  10/12/19   [provider]  Zinc 50 MG TABS Take by mouth.    [provider]      Allergies    Sitagliptin, Wound dressing adhesive, and Tape    Review of Systems   Review of Systems  Physical Exam Updated Vital Signs BP (!) 145/66   Pulse 65   Temp 97.6 F (36.4 C) (Oral)   Resp 16   Ht 1.74 m (5' 8.5")   Wt 92.5 kg   SpO2 100%   BMI 30.56 kg/m  Physical Exam Vitals reviewed.  Constitutional:      General: He is not in acute distress.    Appearance: Normal appearance.  HENT:     Head:  Normocephalic.     Comments: Contusion to left occiput    Right Ear: External ear normal.     Left Ear: External ear normal.     Nose: Nose normal.     Mouth/Throat:     Pharynx: Oropharynx is clear.  Eyes:     Pupils: Pupils are equal, round, and reactive to light.  Cardiovascular:     Rate and Rhythm: Normal rate and regular rhythm.     Pulses: Normal pulses.  Pulmonary:  Effort: Pulmonary effort is normal.     Breath sounds: Normal breath sounds.  Abdominal:     General: Abdomen is flat. Bowel sounds are normal.     Palpations: Abdomen is soft.  Musculoskeletal:     Cervical back: Normal range of motion. No tenderness.     Comments: No point tenderness over thoracic or lumbar spine Back visually inspected no obvious external signs of trauma Left hip palpated and some minor tenderness full active range of motion Some tenderness to medial left knee Abrasion over anterior left knee   Skin:    General: Skin is warm and dry.     Capillary Refill: Capillary refill takes less than 2 seconds.  Neurological:     General: No focal deficit present.     Mental Status: He is alert.  Psychiatric:        Mood and Affect: Mood normal.     ED Results / Procedures / Treatments   Labs (all labs ordered are listed, but only abnormal results are displayed) Labs Reviewed - No data to display  EKG None  Radiology CT Cervical Spine Wo Contrast  Result Date: 04/13/2023 CLINICAL DATA:  89 year old male status post fall, struck back of head. Pain. EXAM: CT CERVICAL SPINE WITHOUT CONTRAST TECHNIQUE: Multidetector CT imaging of the cervical spine was performed without intravenous contrast. Multiplanar CT image reconstructions were also generated. RADIATION DOSE REDUCTION: This exam was performed according to the departmental dose-optimization program which includes automated exposure control, adjustment of the mA and/or kV according to patient size and/or use of iterative reconstruction  technique. COMPARISON:  Head CT today.  Cervical spine CT 11/11/2022. FINDINGS: Alignment: Normal cervical lordosis. Cervicothoracic junction alignment is within normal limits. Bilateral posterior element alignment is within normal limits. Skull base and vertebrae: Visualized skull base is intact. No atlanto-occipital dissociation. C1 and C2 appear intact and aligned. No acute osseous abnormality identified. Soft tissues and spinal canal: No prevertebral fluid or swelling. No visible canal hematoma. Mild cervical carotid calcified atherosclerosis. Otherwise negative visible noncontrast neck soft tissues. Disc levels: Mild for age cervical spine degeneration. Capacious CT appearance of the spinal canal. Upper chest: Visible upper thoracic levels appear intact, upper lungs appear clear. IMPRESSION: No acute traumatic injury identified in the cervical spine. Mild for age cervical spine degeneration. Electronically Signed   By: Odessa Fleming M.D.   On: 04/13/2023 12:40   DG Knee Complete 4 Views Left  Result Date: 04/13/2023 CLINICAL DATA:  89 year old male status post fall, struck back of head. Pain. EXAM: LEFT KNEE - COMPLETE 4+ VIEW COMPARISON:  None Available. FINDINGS: Bone mineralization is within normal limits for age. Normal for age joint spaces and alignment at the left knee. Patella appears intact. No joint effusion identified on cross-table lateral view. Calcified peripheral vascular disease. No discrete soft tissue injury. IMPRESSION: 1. No acute fracture or dislocation identified about the left knee. 2. Calcified peripheral vascular disease. Electronically Signed   By: Odessa Fleming M.D.   On: 04/13/2023 12:37   DG Hip Unilat W or Wo Pelvis 2-3 Views Left  Result Date: 04/13/2023 CLINICAL DATA:  89 year old male status post fall, struck back of head. Pain. EXAM: DG HIP (WITH OR WITHOUT PELVIS) 2-3V LEFT COMPARISON:  CT Abdomen and Pelvis 11/07/2016. FINDINGS: AP pelvis at 1136 hours. Femoral heads normally  located. Pelvis appears stable and intact. Bone mineralization is within normal limits for age. Nonobstructed bowel-gas pattern with retained stool mostly in the rectum. Grossly intact proximal right  femur. Proximal left femur appears intact on dedicated AP and frogleg lateral views. No acute osseous abnormality identified. IMPRESSION: No acute fracture or dislocation identified about the left hip or pelvis. Electronically Signed   By: Odessa Fleming M.D.   On: 04/13/2023 12:35   DG Shoulder Left  Result Date: 04/13/2023 CLINICAL DATA:  89 year old male status post fall, struck back of head. Pain. EXAM: LEFT SHOULDER - 2+ VIEW COMPARISON:  None Available. FINDINGS: No glenohumeral joint dislocation. Superior subluxation of the left humeral head. Proximal left humerus, visible left clavicle and scapula appear intact. Visible left ribs appear intact. No discrete soft tissue injury. IMPRESSION: No acute fracture or dislocation identified about the left shoulder. Evidence of chronic rotator cuff degeneration. Electronically Signed   By: Odessa Fleming M.D.   On: 04/13/2023 12:34   CT Head Wo Contrast  Result Date: 04/13/2023 CLINICAL DATA:  89 year old male status post fall, struck back of head. Pain. EXAM: CT HEAD WITHOUT CONTRAST TECHNIQUE: Contiguous axial images were obtained from the base of the skull through the vertex without intravenous contrast. RADIATION DOSE REDUCTION: This exam was performed according to the departmental dose-optimization program which includes automated exposure control, adjustment of the mA and/or kV according to patient size and/or use of iterative reconstruction technique. COMPARISON:  Brain MRI 11/04/2015.  Head CT 11/11/2022. FINDINGS: Brain: Chronic bilateral mostly subarachnoid space CSF. Trace if any chronic right side low-density subdural collection (series 6, image 30). No intracranial mass effect or midline shift. No acute intracranial hemorrhage identified. Stable cerebral volume.  Stable gray-white matter differentiation throughout the brain. No cortically based acute infarct identified. Vascular: Calcified atherosclerosis at the skull base. No suspicious intracranial vascular hyperdensity. Skull: Multiple bilateral chronic burr holes. No acute osseous abnormality identified. Sinuses/Orbits: Visualized paranasal sinuses and mastoids are stable and well aerated. Other: No acute orbit or scalp sq a at mild left posterosuperior scalp hematoma or contusion series 5, image 45. Underlying calvarium appears stable, intact. Stable orbits soft tissues. IMPRESSION: 1. Mild left posterior scalp hematoma. No acute skull fracture. 2. No acute intracranial abnormality. Stable chronic extra-axial low-density fluid (mostly subarachnoid space) and previous bilateral cranial burr holes. Electronically Signed   By: Odessa Fleming M.D.   On: 04/13/2023 12:33    Procedures Procedures    Medications Ordered in ED Medications - No data to display  ED Course/ Medical Decision Making/ A&P Clinical Course as of 04/13/23 1418  Sun Apr 13, 2023  1225 Left hip x-Janete Quilling reviewed interpreted and no evidence of acute fracture noted on my interpretation awaiting radiologist interpretation [DR]  1226 No evidence of acute fracture noted on my interpretation vessel calcification noted awaited radiologist interpretation [DR]  1237 Chest x-Brystal Kildow reviewed interpreted no evidence of acute abnormality noted radiologist interpretation concurs [DR]  1245 CT cervical spine reviewed and interpreted with no acute abnormality noted radiologist interpretation concurs [DR]  1246 No acute fracture of the left knee noted [DR]  1246 No acute fracture of the left hip noted on my interpretation radiologist interpretation concurs [DR]  1246 Head CT without acute intracranial abnormality [DR]    Clinical Course User Index [DR] Margarita Grizzle, MD                                 Medical Decision Making Amount and/or Complexity of Data  Reviewed Radiology: ordered.   89 year old male coming from home via EMS secondary to mechanical fall.  Per history from EMS and patient fall was due to his wife falling into him.  He has a abrasion to his head, pain in his left shoulder, pain in his left knee.  He is evaluated here for the fall.  Doubt any syncope.  Patient imaged with CT of head and cervical spine with no evidence of acute abnormality Patient with left shoulder x-Ezella Kell no evidence of fracture Patient with some left leg pain imaged with left hip and left knee x-Sheridyn Canino with no evidence of acute fracture Plan to ambulate patient and discharge. Discussed return precautions and need for follow-up and patient voices understanding        Final Clinical Impression(s) / ED Diagnoses Final diagnoses:  Fall, initial encounter  Contusion of scalp, initial encounter  Left leg pain    Rx / DC Orders ED Discharge Orders     None         Margarita Grizzle, MD 04/13/23 1418

## 2023-04-21 ENCOUNTER — Other Ambulatory Visit: Payer: Self-pay | Admitting: Family

## 2023-04-22 ENCOUNTER — Ambulatory Visit (INDEPENDENT_AMBULATORY_CARE_PROVIDER_SITE_OTHER): Payer: Medicare Other | Admitting: Family Medicine

## 2023-04-22 ENCOUNTER — Ambulatory Visit (HOSPITAL_BASED_OUTPATIENT_CLINIC_OR_DEPARTMENT_OTHER)
Admission: RE | Admit: 2023-04-22 | Discharge: 2023-04-22 | Disposition: A | Discharge status: Home / Self Care | Payer: Medicare Other | Source: Ambulatory Visit | Attending: Family Medicine | Admitting: Family Medicine

## 2023-04-22 ENCOUNTER — Encounter: Payer: Self-pay | Admitting: Family Medicine

## 2023-04-22 VITALS — BP 124/72 | HR 76 | Temp 97.6°F | Wt 204.0 lb

## 2023-04-22 DIAGNOSIS — G319 Degenerative disease of nervous system, unspecified: Secondary | ICD-10-CM | POA: Insufficient documentation

## 2023-04-22 DIAGNOSIS — H538 Other visual disturbances: Secondary | ICD-10-CM | POA: Diagnosis present

## 2023-04-22 DIAGNOSIS — G44319 Acute post-traumatic headache, not intractable: Secondary | ICD-10-CM

## 2023-04-22 DIAGNOSIS — R42 Dizziness and giddiness: Secondary | ICD-10-CM | POA: Diagnosis not present

## 2023-04-22 DIAGNOSIS — Z8673 Personal history of transient ischemic attack (TIA), and cerebral infarction without residual deficits: Secondary | ICD-10-CM | POA: Diagnosis not present

## 2023-04-22 DIAGNOSIS — W19XXXD Unspecified fall, subsequent encounter: Secondary | ICD-10-CM | POA: Diagnosis not present

## 2023-04-22 DIAGNOSIS — I6782 Cerebral ischemia: Secondary | ICD-10-CM | POA: Diagnosis not present

## 2023-04-22 DIAGNOSIS — M25562 Pain in left knee: Secondary | ICD-10-CM

## 2023-04-22 DIAGNOSIS — M25552 Pain in left hip: Secondary | ICD-10-CM | POA: Insufficient documentation

## 2023-04-22 DIAGNOSIS — M542 Cervicalgia: Secondary | ICD-10-CM | POA: Diagnosis not present

## 2023-04-22 DIAGNOSIS — R29898 Other symptoms and signs involving the musculoskeletal system: Secondary | ICD-10-CM

## 2023-04-22 NOTE — Assessment & Plan Note (Addendum)
With left knee pain s/p fall.   Plan: MRI Hip and Knee for further evaluation. Referral to Physical therapy for evaluation and intervention for fall prevention

## 2023-04-22 NOTE — Assessment & Plan Note (Signed)
Neck pain following fall, exacerbated by head movements. Plan: CT Cervical Spine without contrast revealed no acute traumatic injury but mild age-related degeneration. Follow-Up Recommendations: Follow-up PRN if pain persists or worsens.

## 2023-04-22 NOTE — Assessment & Plan Note (Addendum)
Patient experienced a fall on 04/13/2023, leading to various injuries including head trauma and leg pain. Now with new onset blurred vision.  Plan: CT Head without contrast ordered stat to evaluate for possible intracranial bleed. Avoid NSAIDs to minimize risk of exacerbating potential bleeding. Follow-Up Recommendations: Immediate ED follow-up with headache persistence or worsening symptoms.

## 2023-04-22 NOTE — Progress Notes (Addendum)
Assessment/Plan:  Total time spent caring for the patient today was 57 minutes. This includes time spent before the visit reviewing the chart, time spent during the visit, and time spent after the visit on documentation, etc.  Problem List Items Addressed This Visit       Other   Fall - Primary    Patient experienced a fall on 04/13/2023, leading to various injuries including head trauma and leg pain. Now with new onset blurred vision.  Plan: CT Head without contrast ordered stat to evaluate for possible intracranial bleed. Avoid NSAIDs to minimize risk of exacerbating potential bleeding. Follow-Up Recommendations: Immediate ED follow-up with headache persistence or worsening symptoms.      Relevant Orders   CT HEAD WO CONTRAST ( )   Ambulatory referral to Physical Therapy   Neck pain    Neck pain following fall, exacerbated by head movements. Plan: CT Cervical Spine without contrast revealed no acute traumatic injury but mild age-related degeneration. Follow-Up Recommendations: Follow-up PRN if pain persists or worsens.      Left hip pain    With left knee pain s/p fall.   Plan: MRI Hip and Knee for further evaluation. Referral to Physical therapy for evaluation and intervention for fall prevention      Relevant Orders   MR HIP LEFT WO CONTRAST   Ambulatory referral to Physical Therapy   Blurry vision, bilateral    If CT head negative for intracranial bleed, recommend referral to ophthalmology if symptoms persist      Relevant Orders   CT HEAD WO CONTRAST ( )   Acute pain of left knee   Relevant Orders   MR KNEE LEFT WO CONTRAST   Ambulatory referral to Physical Therapy   Other Visit Diagnoses     Acute post-traumatic headache, not intractable       Relevant Orders   CT HEAD WO CONTRAST ( )   Dizziness       Relevant Orders   CT HEAD WO CONTRAST ( )   Weakness of extremity       Relevant Orders   Ambulatory referral to Physical Therapy        There are no discontinued medications.  Return if symptoms worsen or fail to improve.    Subjective:   Encounter date: 04/22/2023  Tim Morgan is a 89 y.o. male who has Acute medial meniscal tear; Chronic sinusitis/chronic cough ; Mild persistent asthma in adult without complication; Essential hypertension; Upper airway cough syndrome; Venous stasis of both lower extremities; Cellulitis of face; Type 2 diabetes mellitus without complication, with long-term current use of insulin (HCC); H/O local excision of skin lesion on right lower leg; Rash; Vitamin D deficiency; Cardiomyopathy (HCC); RBBB; Acute cough; Chest wall pain; Class 1 obesity with serious comorbidity in adult; Hyperlipidemia; Acute pain of right shoulder; Fall; Preop general physical exam; Neck pain; Left hip pain; Blurry vision, bilateral; and Acute pain of left knee on their problem list..   He  has a past medical history of Acute bronchitis, COPD (chronic obstructive pulmonary disease) (HCC), Diabetes mellitus, Edema of left lower extremity (07/03/2012), GERD (gastroesophageal reflux disease), History of back injury (07/01/1988), Hypertension (12/06/2011), Left ventricular hypertrophy, Melanoma (HCC), and Vitamin D deficiency..   Chief Complaint: Fall with subsequent head and left leg pain, headaches, and new-onset blurry vision.  History of Present Illness: Patient is an 89 y.o. male presenting with head and left leg pain following a fall on 04/13/2023. The fall resulted in the patient striking their  head and experiencing persistent headaches and blurry vision. Patient also reports left hip and knee pain which affects mobility and strength, complicating daily activities such as stepping and getting into bed.  Review of Systems  Eyes:  Positive for blurred vision.  Musculoskeletal:  Positive for falls, joint pain and neck pain.  Neurological:  Positive for weakness (left leg due to pain) and headaches. Negative for loss  of consciousness.    Past Surgical History:  Procedure Laterality Date   ANKLE SURGERY Left 07/01/1988   APPENDECTOMY  age 62   BRAIN SURGERY  07/01/1993   concusion   CATARACT EXTRACTION Right 04/03/2021   CATARACT EXTRACTION Left 05/01/2021   HERNIA REPAIR     x 2   KNEE ARTHROSCOPY Left 11/18/2012   Procedure: LEFT ARTHROSCOPY KNEE WITH DEBRIDEMENT AND CHRONDROLPLASTY;  Surgeon: Loanne Drilling, MD;  Location: [facility];  Service: Orthopedics;  Laterality: Left;   KNEE SURGERY     NASAL SINUS SURGERY Bilateral    to open up ear tubes    Outpatient Medications Prior to Visit  Medication Sig Dispense Refill   acetaminophen (TYLENOL) 650 MG CR tablet Take 650 mg by mouth every 8 (eight) hours as needed for pain.     Ascorbic Acid (VITAMIN C) 1000 MG tablet Take 1,000 mg by mouth.     bacitracin ointment Apply 1 Application topically 2 (two) times daily. 120 g 0   baclofen (LIORESAL) 10 MG tablet Take 10 mg by mouth 2 (two) times daily.     beclomethasone (QVAR) 80 MCG/ACT inhaler Inhale 2 puffs into the lungs 2 (two) times daily.     Biotin 5000 MCG CAPS Take 1 capsule by mouth daily.     budesonide-formoterol (SYMBICORT) 160-4.5 MCG/ACT inhaler Inhale 2 puffs into the lungs 2 (two) times daily.     Chromium-Cinnamon (CINNAMON PLUS CHROMIUM PO) Take 2-3 capsules by mouth 2 (two) times daily.     ciclopirox (PENLAC) 8 % solution      clobetasol cream (TEMOVATE) 0.05 %      Coenzyme Q10 (COQ10) 100 MG CAPS Take 1 capsule by mouth daily.     cyanocobalamin (VITAMIN B12) 1000 MCG tablet Take 1,000 mcg by mouth daily.     docusate sodium (COLACE) 100 MG capsule Take 100 mg by mouth at bedtime.     Ergocalciferol 50 MCG (2000 UT) CAPS Take 50 mcg by mouth once a week.     esomeprazole (NEXIUM) 40 MG capsule Take 40 mg by mouth daily.     famotidine (PEPCID) 20 MG tablet Take 20 mg by mouth at bedtime. Reported on 10/18/2015  2   ferrous sulfate 325 (65 FE) MG tablet Take 1 tablet by mouth  daily.     fluticasone (FLONASE) 50 MCG/ACT nasal spray Place 2 sprays into both nostrils daily.     furosemide (LASIX) 20 MG tablet Take 1 tablet (20 mg total) by mouth daily. 90 tablet 1   hydrOXYzine (ATARAX/VISTARIL) 25 MG tablet Take 25 mg by mouth 3 (three) times daily as needed.  1   insulin aspart (NOVOLOG) 100 UNIT/ML FlexPen Inject 6 Units into the skin 3 (three) times daily with meals.     insulin glargine (LANTUS) 100 UNIT/ML injection Inject 18 Units into the skin at bedtime.     ipratropium-albuterol (DUONEB) 0.5-2.5 (3) MG/3ML SOLN Reported on 10/18/2015  3   losartan (COZAAR) 50 MG tablet Take 1 tab po daily 90 tablet 1   magnesium oxide (MAG-OX)  400 MG tablet Take 400 mg by mouth daily.     metFORMIN (GLUMETZA) 500 MG (MOD) 24 hr tablet Take 500 mg by mouth 2 (two) times daily with a meal.     methocarbamol (ROBAXIN) 500 MG tablet Take 500 mg by mouth 2 (two) times daily.     Multiple Vitamin (MULTIVITAMIN WITH MINERALS) TABS Take 1 tablet by mouth daily.     Multiple Vitamins-Minerals (PRESERVISION AREDS) TABS Take 1 tablet by mouth daily.     omeprazole (PRILOSEC) 20 MG capsule Take 1 capsule (20 mg total) by mouth daily. 90 capsule 1   Potassium 99 MG TABS Take 99 mg by mouth daily.     Respiratory Therapy Supplies (FLUTTER) DEVI Use as directed 1 each 0   rosuvastatin (CRESTOR) 40 MG tablet Take 1 tablet (40 mg total) by mouth at bedtime. 90 tablet 1   sildenafil (VIAGRA) 100 MG tablet Take 100 mg by mouth daily as needed for erectile dysfunction.     tamsulosin (FLOMAX) 0.4 MG CAPS capsule Take 1 capsule (0.4 mg total) by mouth daily. 90 capsule 1   terbinafine (LAMISIL) 250 MG tablet Take 250 mg by mouth daily.     triamcinolone cream (KENALOG) 0.1 % Apply 1 application  topically daily as needed. For rash/ itching     TRULICITY 0.75 MG/0.5ML SOPN      Zinc 50 MG TABS Take by mouth.     calcium citrate-vitamin D (CITRACAL+D) 315-200 MG-UNIT per tablet Take 1 tablet by  mouth 2 (two) times daily. (Patient not taking: Reported on 04/09/2023)     No facility-administered medications prior to visit.    No family history on file.  Social History   Socioeconomic History   Marital status: Married    Spouse name: Not on file   Number of children: 0   Years of education: Not on file   Highest education level: Not on file  Occupational History   Occupation: Retired [employer]  Tobacco Use   Smoking status: Never   Smokeless tobacco: Never  Vaping Use   Vaping status: Never Used  Substance and Sexual Activity   Alcohol use: No   Drug use: No   Sexual activity: Not on file  Other Topics Concern   Not on file  Social History Narrative   Not on file   Social Determinants of Health   Financial Resource Strain: Low Risk  (02/17/2023)   Overall Financial Resource Strain (CARDIA)    Difficulty of Paying Living Expenses: Not hard at all  Food Insecurity: No Food Insecurity (02/17/2023)   Hunger Vital Sign    Worried About Running Out of Food in the Last Year: Never true    Ran Out of Food in the Last Year: Never true  Transportation Needs: No Transportation Needs (02/17/2023)   PRAPARE - Administrator, Civil Service (Medical): No    Lack of Transportation (Non-Medical): No  Physical Activity: Inactive (02/17/2023)   Exercise Vital Sign    Days of Exercise per Week: 0 days    Minutes of Exercise per Session: 0 min  Stress: No Stress Concern Present (02/17/2023)   [employer] of Occupational Health - Occupational Stress Questionnaire    Feeling of Stress : Not at all  Social Connections: Moderately Integrated (02/17/2023)   Social Connection and Isolation Panel [NHANES]    Frequency of Communication with Friends and Family: More than three times a week    Frequency of Social Gatherings  with Friends and Family: Twice a week    Attends Religious Services: More than 4 times per year    Active Member of Clubs or Organizations: No     Attends Banker Meetings: Never    Marital Status: Married  Catering manager Violence: Not At Risk (02/17/2023)   Humiliation, Afraid, Rape, and Kick questionnaire    Fear of Current or Ex-Partner: No    Emotionally Abused: No    Physically Abused: No    Sexually Abused: No                                                                                                  Objective:  Physical Exam: BP 124/72 (BP Location: Left Arm, Patient Position: Sitting, Cuff Size: Large)   Pulse 76   Temp 97.6 F (36.4 C) (Temporal)   Wt 204 lb (92.5 kg)   SpO2 97%   BMI 30.57 kg/m     Physical Exam HENT:     Head: Contusion (posterior head) present.  Eyes:     General: Lids are normal. Vision grossly intact. Gaze aligned appropriately. No visual field deficit.    Extraocular Movements: Extraocular movements intact.     Conjunctiva/sclera: Conjunctivae normal.     Pupils: Pupils are equal, round, and reactive to light.     Visual Fields: Right eye visual fields normal and left eye visual fields normal.  Musculoskeletal:     Left hip: Tenderness present. Decreased range of motion. Decreased strength.     Left upper leg: Swelling and tenderness present.     Left knee: Swelling present. Decreased range of motion. Tenderness present.  Neurological:     Mental Status: He is alert and oriented to person, place, and time.     Cranial Nerves: Cranial nerves 2-12 are intact.     CT Cervical Spine Wo Contrast  Result Date: 04/13/2023 CLINICAL DATA:  89 year old male status post fall, struck back of head. Pain. EXAM: CT CERVICAL SPINE WITHOUT CONTRAST TECHNIQUE: Multidetector CT imaging of the cervical spine was performed without intravenous contrast. Multiplanar CT image reconstructions were also generated. RADIATION DOSE REDUCTION: This exam was performed according to the departmental dose-optimization program which includes automated exposure control, adjustment of the mA and/or  kV according to patient size and/or use of iterative reconstruction technique. COMPARISON:  Head CT today.  Cervical spine CT 11/11/2022. FINDINGS: Alignment: Normal cervical lordosis. Cervicothoracic junction alignment is within normal limits. Bilateral posterior element alignment is within normal limits. Skull base and vertebrae: Visualized skull base is intact. No atlanto-occipital dissociation. C1 and C2 appear intact and aligned. No acute osseous abnormality identified. Soft tissues and spinal canal: No prevertebral fluid or swelling. No visible canal hematoma. Mild cervical carotid calcified atherosclerosis. Otherwise negative visible noncontrast neck soft tissues. Disc levels: Mild for age cervical spine degeneration. Capacious CT appearance of the spinal canal. Upper chest: Visible upper thoracic levels appear intact, upper lungs appear clear. IMPRESSION: No acute traumatic injury identified in the cervical spine. Mild for age cervical spine degeneration. Electronically Signed   By: Althea Grimmer.D.  On: 04/13/2023 12:40   DG Knee Complete 4 Views Left  Result Date: 04/13/2023 CLINICAL DATA:  89 year old male status post fall, struck back of head. Pain. EXAM: LEFT KNEE - COMPLETE 4+ VIEW COMPARISON:  None Available. FINDINGS: Bone mineralization is within normal limits for age. Normal for age joint spaces and alignment at the left knee. Patella appears intact. No joint effusion identified on cross-table lateral view. Calcified peripheral vascular disease. No discrete soft tissue injury. IMPRESSION: 1. No acute fracture or dislocation identified about the left knee. 2. Calcified peripheral vascular disease. Electronically Signed   By: Odessa Fleming M.D.   On: 04/13/2023 12:37   DG Hip Unilat W or Wo Pelvis 2-3 Views Left  Result Date: 04/13/2023 CLINICAL DATA:  89 year old male status post fall, struck back of head. Pain. EXAM: DG HIP (WITH OR WITHOUT PELVIS) 2-3V LEFT COMPARISON:  CT Abdomen and Pelvis  11/07/2016. FINDINGS: AP pelvis at 1136 hours. Femoral heads normally located. Pelvis appears stable and intact. Bone mineralization is within normal limits for age. Nonobstructed bowel-gas pattern with retained stool mostly in the rectum. Grossly intact proximal right femur. Proximal left femur appears intact on dedicated AP and frogleg lateral views. No acute osseous abnormality identified. IMPRESSION: No acute fracture or dislocation identified about the left hip or pelvis. Electronically Signed   By: Odessa Fleming M.D.   On: 04/13/2023 12:35   DG Shoulder Left  Result Date: 04/13/2023 CLINICAL DATA:  89 year old male status post fall, struck back of head. Pain. EXAM: LEFT SHOULDER - 2+ VIEW COMPARISON:  None Available. FINDINGS: No glenohumeral joint dislocation. Superior subluxation of the left humeral head. Proximal left humerus, visible left clavicle and scapula appear intact. Visible left ribs appear intact. No discrete soft tissue injury. IMPRESSION: No acute fracture or dislocation identified about the left shoulder. Evidence of chronic rotator cuff degeneration. Electronically Signed   By: Odessa Fleming M.D.   On: 04/13/2023 12:34   CT Head Wo Contrast  Result Date: 04/13/2023 CLINICAL DATA:  89 year old male status post fall, struck back of head. Pain. EXAM: CT HEAD WITHOUT CONTRAST TECHNIQUE: Contiguous axial images were obtained from the base of the skull through the vertex without intravenous contrast. RADIATION DOSE REDUCTION: This exam was performed according to the departmental dose-optimization program which includes automated exposure control, adjustment of the mA and/or kV according to patient size and/or use of iterative reconstruction technique. COMPARISON:  Brain MRI 11/04/2015.  Head CT 11/11/2022. FINDINGS: Brain: Chronic bilateral mostly subarachnoid space CSF. Trace if any chronic right side low-density subdural collection (series 6, image 30). No intracranial mass effect or midline shift.  No acute intracranial hemorrhage identified. Stable cerebral volume. Stable gray-white matter differentiation throughout the brain. No cortically based acute infarct identified. Vascular: Calcified atherosclerosis at the skull base. No suspicious intracranial vascular hyperdensity. Skull: Multiple bilateral chronic burr holes. No acute osseous abnormality identified. Sinuses/Orbits: Visualized paranasal sinuses and mastoids are stable and well aerated. Other: No acute orbit or scalp sq a at mild left posterosuperior scalp hematoma or contusion series 5, image 45. Underlying calvarium appears stable, intact. Stable orbits soft tissues. IMPRESSION: 1. Mild left posterior scalp hematoma. No acute skull fracture. 2. No acute intracranial abnormality. Stable chronic extra-axial low-density fluid (mostly subarachnoid space) and previous bilateral cranial burr holes. Electronically Signed   By: Odessa Fleming M.D.   On: 04/13/2023 12:33   DG Chest Port 1 View  Result Date: 02/22/2023 CLINICAL DATA:  Cough EXAM: PORTABLE CHEST 1 VIEW COMPARISON:  X-ray 11/26/2022 FINDINGS: Underinflation. Stable cardiopericardial silhouette. Calcified aorta. No consolidation, pneumothorax or effusion. No edema. IMPRESSION: Underinflation.  No acute cardiopulmonary disease. Electronically Signed   By: Karen Kays M.D.   On: 02/22/2023 15:21    Recent Results (from the past 2160 hour(s))  SARS Coronavirus 2 by RT PCR (hospital order, performed in [facility] hospital lab) *cepheid single result test* Anterior Nasal Swab     Status: Abnormal   Collection Time: 02/22/23  2:19 PM   Specimen: Anterior Nasal Swab  Result Value Ref Range   SARS Coronavirus 2 by RT PCR POSITIVE (A) NEGATIVE    Comment: (NOTE) SARS-CoV-2 target nucleic acids are DETECTED  SARS-CoV-2 RNA is generally detectable in upper respiratory specimens  during the acute phase of infection.  Positive results are indicative  of the presence of the identified virus, but  do not rule out bacterial infection or co-infection with other pathogens not detected by the test.  Clinical correlation with patient history and  other diagnostic information is necessary to determine patient infection status.  The expected result is negative.  Fact Sheet for Patients:   RoadLapTop.co.za   Fact Sheet for Healthcare Providers:   http://kim-miller.com/    This test is not yet approved or cleared by the [place] FDA and  has been authorized for detection and/or diagnosis of SARS-CoV-2 by FDA under an Emergency Use Authorization (EUA).  This EUA will remain in effect (meaning this test can be used) for the duration of  the COVID-19 declaration under Section 564(b)(1)  of the Act, 21 U.S.C. section 360-bbb-3(b)(1), unless the authorization is terminated or revoked sooner.   Performed at [facility], [address], [place], [place] [address]         Garner Nash, MD, MS

## 2023-04-22 NOTE — Assessment & Plan Note (Signed)
If CT head negative for intracranial bleed, recommend referral to ophthalmology if symptoms persist

## 2023-04-22 NOTE — Patient Instructions (Addendum)
We are ordering an urgent CT scan of the head to check for possible brain bleeds due to your persistent headache and recent fall. The office will call to help schedule.  For left hip and knee pain, we are ordering an MRI of hip and knee as well as referring to physical therapy.  Avoid taking pain medications like ibuprofen or naproxen and observe for any worsening symptoms such as severe headache, vision changes, or dizziness. If severe symptoms develop, go to the Emergency department.

## 2023-04-22 NOTE — Addendum Note (Signed)
Addended by: Fanny Bien B on: 04/22/2023 01:03 PM   Modules accepted: Level of Service

## 2023-04-23 ENCOUNTER — Telehealth: Payer: Self-pay | Admitting: Family Medicine

## 2023-04-23 NOTE — Telephone Encounter (Signed)
Please call pt's wife, Adela Lank at [phone] with the results of pt's CT HEAD WO CONTRAST ( ) (Accession [id number]) (Order [id number])  result from 04/22/23. She is very worried.

## 2023-04-23 NOTE — Telephone Encounter (Signed)
Left patient a detailed voice message advising them that the imaging hasn't been reviewed by the provider and I would forward their concerns to PCP and will advise them on the results when he returns to the office on tomorrow.

## 2023-04-23 NOTE — Telephone Encounter (Signed)
Patient's wife called in concerned that patient may have a brain bleed and wanted to know the results to give her peace of mind and if I could review them with her. I advised her that I'm unable to due to it being out of my scope. She stated she couldn't wait until tomorrow for Dr. [name] to review them and if any other provider could. Are you able to review the CT HEAD WO CONTRAST results in Dr. Carollee Massed absence?

## 2023-04-23 NOTE — Telephone Encounter (Signed)
Tim Morgan of the results and she was very thankful. She stated is it ok to take tylenol now that the CT came back negative for a brain bleed. I advised her I will route that to Dr. [name] to review in the morning to be safe and she verbalized understanding.

## 2023-04-24 NOTE — Telephone Encounter (Signed)
Left patient a detailed voice message with annotation below and to return call to office with any concerns  

## 2023-04-27 ENCOUNTER — Ambulatory Visit (HOSPITAL_BASED_OUTPATIENT_CLINIC_OR_DEPARTMENT_OTHER)
Admission: RE | Admit: 2023-04-27 | Discharge: 2023-04-27 | Disposition: A | Discharge status: Home / Self Care | Payer: Medicare Other | Source: Ambulatory Visit | Attending: Family Medicine | Admitting: Family Medicine

## 2023-04-27 DIAGNOSIS — M25552 Pain in left hip: Secondary | ICD-10-CM | POA: Insufficient documentation

## 2023-04-27 DIAGNOSIS — M25562 Pain in left knee: Secondary | ICD-10-CM | POA: Insufficient documentation

## 2023-04-30 ENCOUNTER — Other Ambulatory Visit: Payer: Self-pay | Admitting: Family

## 2023-05-07 ENCOUNTER — Ambulatory Visit: Payer: Medicare Other | Attending: Family Medicine

## 2023-05-07 ENCOUNTER — Other Ambulatory Visit: Payer: Self-pay

## 2023-05-07 DIAGNOSIS — R29898 Other symptoms and signs involving the musculoskeletal system: Secondary | ICD-10-CM | POA: Diagnosis not present

## 2023-05-07 DIAGNOSIS — M25562 Pain in left knee: Secondary | ICD-10-CM | POA: Diagnosis not present

## 2023-05-07 DIAGNOSIS — R296 Repeated falls: Secondary | ICD-10-CM

## 2023-05-07 DIAGNOSIS — W19XXXD Unspecified fall, subsequent encounter: Secondary | ICD-10-CM | POA: Insufficient documentation

## 2023-05-07 DIAGNOSIS — X58XXXD Exposure to other specified factors, subsequent encounter: Secondary | ICD-10-CM | POA: Insufficient documentation

## 2023-05-07 DIAGNOSIS — R2681 Unsteadiness on feet: Secondary | ICD-10-CM

## 2023-05-07 DIAGNOSIS — M25552 Pain in left hip: Secondary | ICD-10-CM | POA: Insufficient documentation

## 2023-05-07 NOTE — Therapy (Signed)
OUTPATIENT PHYSICAL THERAPY LOWER EXTREMITY EVALUATION   Patient Name: Tim Morgan MRN: [id number] DOB:December 05, 1932, 89 y.o., male Today's Date: 05/07/2023  END OF SESSION:  PT End of Session - 05/07/23 1310     Visit Number 1    Date for PT Re-Evaluation 07/30/23    Progress Note Due on Visit 10    PT Start Time 1304    PT Stop Time 1345    PT Time Calculation (min) 41 min    Activity Tolerance Patient tolerated treatment well    Behavior During Therapy WFL for tasks assessed/performed             Past Medical History:  Diagnosis Date   Acute bronchitis    COPD (chronic obstructive pulmonary disease) (HCC)    Diabetes mellitus    Edema of left lower extremity 07/03/2012   venous doppler-no evidence of thrombus or thrombophlebitis,left gsv valvular insufficency throughout the vein,left SSV appeared patent wiyh no venous insuffiency, left popliteal area questionable rupture Baker's cyst this was considered a abnormal doppler   GERD (gastroesophageal reflux disease)    History of back injury 07/01/1988   L 4 to , no surgery healed on own   Hypertension 12/06/2011   echo-EF 65-70%    Left ventricular hypertrophy    Melanoma (HCC)    Vitamin D deficiency    Past Surgical History:  Procedure Laterality Date   ANKLE SURGERY Left 07/01/1988   APPENDECTOMY  age 63   BRAIN SURGERY  07/01/1993   concusion   CATARACT EXTRACTION Right 04/03/2021   CATARACT EXTRACTION Left 05/01/2021   HERNIA REPAIR     x 2   KNEE ARTHROSCOPY Left 11/18/2012   Procedure: LEFT ARTHROSCOPY KNEE WITH DEBRIDEMENT AND CHRONDROLPLASTY;  Surgeon: Loanne Drilling, MD;  Location: [facility];  Service: Orthopedics;  Laterality: Left;   KNEE SURGERY     NASAL SINUS SURGERY Bilateral    to open up ear tubes   Patient Active Problem List   Diagnosis Date Noted   Neck pain 04/22/2023   Left hip pain 04/22/2023   Blurry vision, bilateral 04/22/2023   Acute pain of left knee 04/22/2023   H/O local  excision of skin lesion on right lower leg 11/22/2022   Rash 11/22/2022   Vitamin D deficiency 11/22/2022   Cardiomyopathy (HCC) 11/22/2022   RBBB 11/22/2022   Acute cough 11/22/2022   Chest wall pain 11/22/2022   Class 1 obesity with serious comorbidity in adult 11/22/2022   Hyperlipidemia 11/22/2022   Acute pain of right shoulder 11/22/2022   Fall 11/22/2022   Preop general physical exam 11/22/2022   Venous stasis of both lower extremities 09/18/2022   Cellulitis of face 09/18/2022   Type 2 diabetes mellitus without complication, with long-term current use of insulin (HCC) 09/18/2022   Essential hypertension 09/10/2014   Upper airway cough syndrome 09/10/2014   Mild persistent asthma in adult without complication 08/17/2014   Chronic sinusitis/chronic cough  05/19/2014   Acute medial meniscal tear 11/18/2012    PCP: Fanny Bien, MD  REFERRING PROVIDER: same  REFERRING DIAG: recurrent falls, L hip and L knee pain  THERAPY DIAG:  Unsteady gait  Repeated falls  Rationale for Evaluation and Treatment: Rehabilitation  ONSET DATE: 10/2022  SUBJECTIVE:   SUBJECTIVE STATEMENT: Reports 2 falls in the last year.  Most recently fell in his yard 3 weeks ago,when his wife fell on him and knocked him down.  Initially had much L hip and knee pain but  now that pain is much improved.  Not noticing periods of staggering , does note that he uses chairs with rails on them , avoids chairs without arm rests. Has obtained st cane 2 weeks ago after his PCP convinced him he needed it.  PERTINENT HISTORY: Larey Seat x 2 this year, referred by PCP for gait training, fall prevention training and pain L hip and knee after recent fall PAIN:  Are you having pain? Yes: NPRS scale: 0 to3/10 Pain location: L lat knee and L hip, lat thigh Pain description: intermittent sharp Aggravating factors: planting to turn Relieving factors: resting, wearing compression stockings  PRECAUTIONS: None  RED  FLAGS: None   WEIGHT BEARING RESTRICTIONS: No  FALLS:  Has patient fallen in last 6 months? Yes. Number of falls 2  LIVING ENVIRONMENT: Lives with: lives with their family and lives with their spouse Lives in: House/apartment Stairs: No Has following equipment at home: Single point cane  OCCUPATION: retired from career in Company secretary  PLOF: Independent  PATIENT GOALS: prevent falls, be able to assist my wife with her walking, et   NEXT MD VISIT: unknown  OBJECTIVE:  Note: Objective measures were completed at Evaluation unless otherwise noted.  DIAGNOSTIC FINDINGS: CT negative head MRI normal L knee and hip    COGNITION: Overall cognitive status: Within functional limits for tasks assessed     SENSATION: Not tested  EDEMA: Patient reports B lower leg edema, has begun using compression stockings  MUSCLE LENGTH: Hamstrings: Right wfl deg; Left wfl deg POSTURE: rounded shoulders and forward head  PALPATION: na  LOWER EXTREMITY ROM:all ROM screening wnl B LEs  LOWER EXTREMITY MMT: MMT wfl today Coordination testing LE's wnl    FUNCTIONAL TESTS:  30 seconds chair stand test  10 BERG:  43/56  GAIT: Distance walked: in clinic up to 70' Assistive device utilized: Single point cane Level of assistance: Modified independence Comments: B foot slap   TODAY'S TREATMENT:                                                                                                                              DATE: 05/07/23:  evaluation    PATIENT EDUCATION:  Education details: POC, goals Person educated: Patient Education method: Programmer, multimedia, Demonstration, Tactile cues, Verbal cues, and Handouts Education comprehension: verbalized understanding  HOME EXERCISE PROGRAM: TBD  ASSESSMENT:  CLINICAL IMPRESSION: Patient is a 89 y.o. male who was evaluated  today by skilled physical therapy for recent falls as well as resultant L knee and L lat hip pain.  Today his evaluation  focused on his stability and fall risk  he scored in mod fall risk category with BERG balance score of 43/56. Noted B foot slap with gait.  Did not get to thoroughly evaluate L hip and knee pain although brief MMT and flexibility assessment wfl.  He reports minimal pain now in these areas.  He has obtained a cane and encouraged him to  continue to use, also advised him to pursue a larger tip for the cane for stability and to place the cane where he wont have to reach down to pick it up frequently.He should benefit from skilled PT to address his stability and balance as well as further assess L knee and thigh pain.   OBJECTIVE IMPAIRMENTS: decreased balance, decreased knowledge of condition, decreased knowledge of use of DME, and pain.   ACTIVITY LIMITATIONS: squatting, transfers, and locomotion level  PARTICIPATION LIMITATIONS: community activity, yard work, and church  PERSONAL FACTORS: Age, Behavior pattern, Fitness, and 1-2 comorbidities: DM, COPD  are also affecting patient's functional outcome.   REHAB POTENTIAL: Good  CLINICAL DECISION MAKING: Stable/uncomplicated  EVALUATION COMPLEXITY: Low   GOALS: Goals reviewed with patient? Yes  SHORT TERM GOALS: Target date: 3 weeks 05/28/23  1.  IHEP Baseline:TBD Goal status: INITIAL   LONG TERM GOALS: Target date: 07/30/23  Sharlene Motts score improve from 43/56 to 50/56  Baseline:  Goal status: INITIAL  2.  30 sec sit to stand greater than 12 Baseline: 10 Goal status: INITIAL  3.  I and safe curb negotiation with st cane Baseline: not assessed yet Goal status: INITIAL   PLAN:  PT FREQUENCY: 1-2x/week  PT DURATION: 12 weeks  PLANNED INTERVENTIONS: 97110-Therapeutic exercises, 97530- Therapeutic activity, [id number]- Neuromuscular re-education, 97535- Self Care, 16109- Manual therapy, and 97116- Gait training  PLAN FOR NEXT SESSION: introduce home program for balance, ankle strength and coordination   Taryne Kiger L Cornelious Diven, PT, DPT,  OC[facility] 05/07/2023, 5:16 PM

## 2023-05-09 ENCOUNTER — Telehealth: Payer: Self-pay | Admitting: Family Medicine

## 2023-05-09 DIAGNOSIS — S72002A Fracture of unspecified part of neck of left femur, initial encounter for closed fracture: Secondary | ICD-10-CM

## 2023-05-09 NOTE — Telephone Encounter (Signed)
Radiology wanted to confirm that we could see the MRI results and I would forward to provider for review. Please advise.

## 2023-05-09 NOTE — Telephone Encounter (Signed)
Tiffany from [facility] Radiology [phone]  Wants to give results from MRI of pts left hip.

## 2023-05-12 ENCOUNTER — Other Ambulatory Visit: Payer: Self-pay | Admitting: Family Medicine

## 2023-05-12 DIAGNOSIS — I255 Ischemic cardiomyopathy: Secondary | ICD-10-CM

## 2023-05-12 MED ORDER — FUROSEMIDE 40 MG PO TABS
40.0000 mg | ORAL_TABLET | Freq: Every day | ORAL | 3 refills | Status: DC
Start: 2023-05-12 — End: 2024-04-09

## 2023-05-12 NOTE — Telephone Encounter (Addendum)
Spoke with patient and wife regarding annotation below. Patient would like to know if the MRI of the knee showed a tear in the knee as well?   He also requested new rx 90 day supply [facility] Losartan 50 mg, Rosuvastatin 40 mg, and furosemide 40mg . The furosemide we have on file is for 20mg . Please advise. I pended the other two.

## 2023-05-12 NOTE — Addendum Note (Signed)
Addended by: Trudee Kuster on: 05/12/2023 09:49 AM   Modules accepted: Orders

## 2023-05-12 NOTE — Addendum Note (Signed)
Addended by: Fanny Bien B on: 05/12/2023 09:22 AM   Modules accepted: Orders

## 2023-05-13 ENCOUNTER — Ambulatory Visit (INDEPENDENT_AMBULATORY_CARE_PROVIDER_SITE_OTHER): Payer: Medicare Other | Admitting: Physician Assistant

## 2023-05-13 DIAGNOSIS — S72145A Nondisplaced intertrochanteric fracture of left femur, initial encounter for closed fracture: Secondary | ICD-10-CM

## 2023-05-13 MED ORDER — LOSARTAN POTASSIUM 50 MG PO TABS: ORAL_TABLET | ORAL | 1 refills | Status: DC

## 2023-05-13 MED ORDER — ROSUVASTATIN CALCIUM 40 MG PO TABS: 40.0000 mg | ORAL_TABLET | Freq: Every day | ORAL | 1 refills | Status: DC

## 2023-05-13 NOTE — Progress Notes (Signed)
Office Visit Note   Patient: Tim Morgan           Date of Birth: 09-01-1932           MRN: [id number] Visit Date: 05/13/2023              Requested by: Garnette Gunner, MD [address] [place],  [place] [address] PCP: Garnette Gunner, MD   Assessment & Plan: Visit Diagnoses:  1. Closed nondisplaced intertrochanteric fracture of left femur, initial encounter (HCC)     Plan: Impression is 4 weeks status post left hip nondisplaced intertrochanteric femur fracture.  Patient is currently asymptomatic.  We do not need new x-rays today.  He will continue to advance with activity as tolerated.  He will follow-up with [place] as needed.  Call with concerns or questions.  Follow-Up Instructions: Return if symptoms worsen or fail to improve.   Orders:  No orders of the defined types were placed in this encounter.  No orders of the defined types were placed in this encounter.     Procedures: No procedures performed   Clinical Data: No additional findings.   Subjective: Chief Complaint  Patient presents with   Left Hip - Injury    HPI patient is a pleasant 89 year old gentleman who comes in today for follow-up of his left hip.  He sustained a mechanical fall approximately 4 weeks ago possibly twisting his leg.  He was seen in the ED at that time with minimal pain to the left hip.  Initial x-rays of the hip were unremarkable but further imaging with an MRI scan showed a nondisplaced intertrochanteric femur fracture.  He is here today for follow-up.  He does note very mild discomfort initially but no pain at this time.  Review of Systems as detailed in HPI.  All others reviewed and are negative.   Objective: Vital Signs: There were no vitals taken for this visit.  Physical Exam well-developed well-nourished gentleman in no acute distress.  Alert oriented x 3.  Ortho Exam left hip exam: No pain with hip flexion or logroll.  He is neurovascularly intact  distally.  Specialty Comments:  No specialty comments available.  Imaging: No new imaging   PMFS History: Patient Active Problem List   Diagnosis Date Noted   Neck pain 04/22/2023   Left hip pain 04/22/2023   Blurry vision, bilateral 04/22/2023   Acute pain of left knee 04/22/2023   H/O local excision of skin lesion on right lower leg 11/22/2022   Rash 11/22/2022   Vitamin D deficiency 11/22/2022   Cardiomyopathy (HCC) 11/22/2022   RBBB 11/22/2022   Acute cough 11/22/2022   Chest wall pain 11/22/2022   Class 1 obesity with serious comorbidity in adult 11/22/2022   Hyperlipidemia 11/22/2022   Acute pain of right shoulder 11/22/2022   Fall 11/22/2022   Preop general physical exam 11/22/2022   Venous stasis of both lower extremities 09/18/2022   Cellulitis of face 09/18/2022   Type 2 diabetes mellitus without complication, with long-term current use of insulin (HCC) 09/18/2022   Essential hypertension 09/10/2014   Upper airway cough syndrome 09/10/2014   Mild persistent asthma in adult without complication 08/17/2014   Chronic sinusitis/chronic cough  05/19/2014   Acute medial meniscal tear 11/18/2012   Past Medical History:  Diagnosis Date   Acute bronchitis    COPD (chronic obstructive pulmonary disease) (HCC)    Diabetes mellitus    Edema of left lower extremity 07/03/2012  venous doppler-no evidence of thrombus or thrombophlebitis,left gsv valvular insufficency throughout the vein,left SSV appeared patent wiyh no venous insuffiency, left popliteal area questionable rupture Baker's cyst this was considered a abnormal doppler   GERD (gastroesophageal reflux disease)    History of back injury 07/01/1988   L 4 to , no surgery healed on own   Hypertension 12/06/2011   echo-EF 65-70%    Left ventricular hypertrophy    Melanoma (HCC)    Vitamin D deficiency     No family history on file.  Past Surgical History:  Procedure Laterality Date   ANKLE SURGERY Left  07/01/1988   APPENDECTOMY  age 14   BRAIN SURGERY  07/01/1993   concusion   CATARACT EXTRACTION Right 04/03/2021   CATARACT EXTRACTION Left 05/01/2021   HERNIA REPAIR     x 2   KNEE ARTHROSCOPY Left 11/18/2012   Procedure: LEFT ARTHROSCOPY KNEE WITH DEBRIDEMENT AND CHRONDROLPLASTY;  Surgeon: Loanne Drilling, MD;  Location: [facility];  Service: Orthopedics;  Laterality: Left;   KNEE SURGERY     NASAL SINUS SURGERY Bilateral    to open up ear tubes   Social History   Occupational History   Occupation: Retired [employer]  Tobacco Use   Smoking status: Never   Smokeless tobacco: Never  Vaping Use   Vaping status: Never Used  Substance and Sexual Activity   Alcohol use: No   Drug use: No   Sexual activity: Not on file

## 2023-05-13 NOTE — Telephone Encounter (Signed)
Spoke with patient's wife and she verbalized understanding.

## 2023-05-13 NOTE — Addendum Note (Signed)
Addended by: Trudee Kuster on: 05/13/2023 08:05 AM   Modules accepted: Orders

## 2023-05-16 ENCOUNTER — Telehealth: Payer: Self-pay

## 2023-05-16 NOTE — Telephone Encounter (Signed)
Received surgery clearance for right reverse total shoulder replacement surgery. Placed form in PCP office for review.

## 2023-05-19 NOTE — Telephone Encounter (Signed)
Spoke with patient and wife and they stated they will call back in a few weeks after the holidays to follow up.

## 2023-05-27 ENCOUNTER — Ambulatory Visit: Payer: Medicare Other | Admitting: Physical Therapy

## 2023-05-27 ENCOUNTER — Encounter: Payer: Self-pay | Admitting: Family Medicine

## 2023-05-27 ENCOUNTER — Ambulatory Visit: Payer: Medicare Other | Admitting: Family Medicine

## 2023-05-27 VITALS — BP 118/72 | HR 69 | Temp 98.2°F | Wt 199.2 lb

## 2023-05-27 DIAGNOSIS — J069 Acute upper respiratory infection, unspecified: Secondary | ICD-10-CM

## 2023-05-27 LAB — POCT INFLUENZA A/B
Influenza A, POC: NEGATIVE
Influenza B, POC: NEGATIVE

## 2023-05-27 LAB — POC COVID19 BINAXNOW: SARS Coronavirus 2 Ag: NEGATIVE

## 2023-05-27 MED ORDER — FLUTICASONE PROPIONATE 50 MCG/ACT NA SUSP
2.0000 | Freq: Every day | NASAL | 0 refills | Status: DC
Start: 2023-05-27 — End: 2024-02-24

## 2023-05-27 NOTE — Assessment & Plan Note (Signed)
Initiate fluticasone nasal spray, one spray in each nostril daily. Recommend saline nasal spray as adjunct therapy. Encourage adequate hydration and rest. Educate on symptom monitoring and management. Return if symptoms persist beyond 7-10 days, worsen, or if new symptoms such as fever, facial pain, chest pain, or shortness of breath develop.

## 2023-05-27 NOTE — Patient Instructions (Signed)
It was a pleasure to see you today! Thank you for choosing [facility] for your primary care. Tim Morgan was seen for viral infection.  Please be sure to drink plenty of fluids.   You may take the following OTC medications to help with symptoms:  For cough, use  cough syrups or other cough suppressants.  For headache, sore throat, fevers, muscle aches, chills, other pain, take ibuprofen or tylenol  For congestion, use fluticasone nasal sprays, or antihistamines  Please follow up if no improvement.   Go to ED if you have severe chest pain, fevers, shortness of breath or other worrisome symptoms.

## 2023-05-27 NOTE — Progress Notes (Signed)
Assessment/Plan:   Problem List Items Addressed This Visit       Respiratory   Viral URI with cough - Primary    Initiate fluticasone nasal spray, one spray in each nostril daily. Recommend saline nasal spray as adjunct therapy. Encourage adequate hydration and rest. Educate on symptom monitoring and management. Return if symptoms persist beyond 7-10 days, worsen, or if new symptoms such as fever, facial pain, chest pain, or shortness of breath develop.      Relevant Medications   fluticasone (FLONASE) 50 MCG/ACT nasal spray   Other Relevant Orders   POC COVID-19 BinaxNow (Completed)   POCT Influenza A/B (Completed)    Medications Discontinued During This Encounter  Medication Reason   fluticasone (FLONASE) 50 MCG/ACT nasal spray Reorder    Return if symptoms worsen or fail to improve.    Subjective:   Encounter date: 05/27/2023  EAN CLEMANS is a 89 y.o. male who has Acute medial meniscal tear; Chronic sinusitis/chronic cough ; Mild persistent asthma in adult without complication; Essential hypertension; Upper airway cough syndrome; Venous stasis of both lower extremities; Cellulitis of face; Type 2 diabetes mellitus without complication, with long-term current use of insulin (HCC); H/O local excision of skin lesion on right lower leg; Rash; Vitamin D deficiency; Cardiomyopathy (HCC); RBBB; Acute cough; Chest wall pain; Class 1 obesity with serious comorbidity in adult; Hyperlipidemia; Acute pain of right shoulder; Fall; Preop general physical exam; Neck pain; Left hip pain; Blurry vision, bilateral; Acute pain of left knee; and Viral URI with cough on their problem list..   He  has a past medical history of Acute bronchitis, COPD (chronic obstructive pulmonary disease) (HCC), Diabetes mellitus, Edema of left lower extremity (07/03/2012), GERD (gastroesophageal reflux disease), History of back injury (07/01/1988), Hypertension (12/06/2011), Left ventricular hypertrophy,  Melanoma (HCC), and Vitamin D deficiency..   Chief Complaint: Nasal congestion and yellow nasal discharge for 3 days.  History of Present Illness:   The patient reports a 3-day history of nasal congestion accompanied by yellow nasal discharge. They experience post-nasal drip, leading to throat irritation and intermittent hoarseness that sometimes interferes with speaking. No associated pain or fever has been noted. Symptoms appear to be improving. Currently not taking any medications for these symptoms.  Review of Systems: Constitutional: Denies fever or chills. HEENT: Positive for nasal congestion, yellow nasal discharge, post-nasal drip, and minimal yellow discharge from the left ear. Denies sinus pain or tenderness. Respiratory: Denies cough and shortness of breath. Cardiovascular: Denies chest pain. Gastrointestinal: Negative. Musculoskeletal: Negative. Neurological: Negative. Psychiatric: Negative. Skin: Negative. All other systems reviewed and are negative.    Past Surgical History:  Procedure Laterality Date   ANKLE SURGERY Left 07/01/1988   APPENDECTOMY  age 74   BRAIN SURGERY  07/01/1993   concusion   CATARACT EXTRACTION Right 04/03/2021   CATARACT EXTRACTION Left 05/01/2021   HERNIA REPAIR     x 2   KNEE ARTHROSCOPY Left 11/18/2012   Procedure: LEFT ARTHROSCOPY KNEE WITH DEBRIDEMENT AND CHRONDROLPLASTY;  Surgeon: Loanne Drilling, MD;  Location: [facility];  Service: Orthopedics;  Laterality: Left;   KNEE SURGERY     NASAL SINUS SURGERY Bilateral    to open up ear tubes    Outpatient Medications Prior to Visit  Medication Sig Dispense Refill   acetaminophen (TYLENOL) 650 MG CR tablet Take 650 mg by mouth every 8 (eight) hours as needed for pain.     Ascorbic Acid (VITAMIN C) 1000 MG tablet Take 1,000 mg  by mouth.     bacitracin ointment Apply 1 Application topically 2 (two) times daily. 120 g 0   baclofen (LIORESAL) 10 MG tablet Take 10 mg by mouth 2 (two) times  daily.     beclomethasone (QVAR) 80 MCG/ACT inhaler Inhale 2 puffs into the lungs 2 (two) times daily.     Biotin 5000 MCG CAPS Take 1 capsule by mouth daily.     budesonide-formoterol (SYMBICORT) 160-4.5 MCG/ACT inhaler Inhale 2 puffs into the lungs 2 (two) times daily.     Chromium-Cinnamon (CINNAMON PLUS CHROMIUM PO) Take 2-3 capsules by mouth 2 (two) times daily.     ciclopirox (PENLAC) 8 % solution      clobetasol cream (TEMOVATE) 0.05 %      Coenzyme Q10 (COQ10) 100 MG CAPS Take 1 capsule by mouth daily.     cyanocobalamin (VITAMIN B12) 1000 MCG tablet Take 1,000 mcg by mouth daily.     docusate sodium (COLACE) 100 MG capsule Take 100 mg by mouth at bedtime.     Ergocalciferol 50 MCG (2000 UT) CAPS Take 50 mcg by mouth once a week.     esomeprazole (NEXIUM) 40 MG capsule Take 40 mg by mouth daily.     famotidine (PEPCID) 20 MG tablet Take 20 mg by mouth at bedtime. Reported on 10/18/2015  2   ferrous sulfate 325 (65 FE) MG tablet Take 1 tablet by mouth daily.     furosemide (LASIX) 40 MG tablet Take 1 tablet (40 mg total) by mouth daily. 90 tablet 3   hydrOXYzine (ATARAX/VISTARIL) 25 MG tablet Take 25 mg by mouth 3 (three) times daily as needed.  1   insulin aspart (NOVOLOG) 100 UNIT/ML FlexPen Inject 6 Units into the skin 3 (three) times daily with meals.     insulin glargine (LANTUS) 100 UNIT/ML injection Inject 18 Units into the skin at bedtime.     ipratropium-albuterol (DUONEB) 0.5-2.5 (3) MG/3ML SOLN Reported on 10/18/2015  3   losartan (COZAAR) 50 MG tablet Take 1 tab po daily 90 tablet 1   magnesium oxide (MAG-OX) 400 MG tablet Take 400 mg by mouth daily.     metFORMIN (GLUMETZA) 500 MG (MOD) 24 hr tablet Take 500 mg by mouth 2 (two) times daily with a meal.     methocarbamol (ROBAXIN) 500 MG tablet Take 500 mg by mouth 2 (two) times daily.     Multiple Vitamin (MULTIVITAMIN WITH MINERALS) TABS Take 1 tablet by mouth daily.     Multiple Vitamins-Minerals (PRESERVISION AREDS) TABS  Take 1 tablet by mouth daily.     omeprazole (PRILOSEC) 20 MG capsule Take 1 capsule (20 mg total) by mouth daily. 90 capsule 1   Potassium 99 MG TABS Take 99 mg by mouth daily.     Respiratory Therapy Supplies (FLUTTER) DEVI Use as directed 1 each 0   rosuvastatin (CRESTOR) 40 MG tablet Take 1 tablet (40 mg total) by mouth at bedtime. 90 tablet 1   sildenafil (VIAGRA) 100 MG tablet Take 100 mg by mouth daily as needed for erectile dysfunction.     tamsulosin (FLOMAX) 0.4 MG CAPS capsule Take 1 capsule (0.4 mg total) by mouth daily. 90 capsule 1   terbinafine (LAMISIL) 250 MG tablet Take 250 mg by mouth daily.     triamcinolone cream (KENALOG) 0.1 % Apply 1 application  topically daily as needed. For rash/ itching     TRULICITY 0.75 MG/0.5ML SOPN      Zinc 50 MG TABS Take  by mouth.     fluticasone (FLONASE) 50 MCG/ACT nasal spray Place 2 sprays into both nostrils daily.     calcium citrate-vitamin D (CITRACAL+D) 315-200 MG-UNIT per tablet Take 1 tablet by mouth 2 (two) times daily. (Patient not taking: Reported on 04/09/2023)     No facility-administered medications prior to visit.    No family history on file.  Social History   Socioeconomic History   Marital status: Married    Spouse name: Not on file   Number of children: 0   Years of education: Not on file   Highest education level: Not on file  Occupational History   Occupation: Retired [employer]  Tobacco Use   Smoking status: Never   Smokeless tobacco: Never  Vaping Use   Vaping status: Never Used  Substance and Sexual Activity   Alcohol use: No   Drug use: No   Sexual activity: Not on file  Other Topics Concern   Not on file  Social History Narrative   Not on file   Social Determinants of Health   Financial Resource Strain: Low Risk  (02/17/2023)   Overall Financial Resource Strain (CARDIA)    Difficulty of Paying Living Expenses: Not hard at all  Food Insecurity: No Food Insecurity (02/17/2023)   Hunger Vital  Sign    Worried About Running Out of Food in the Last Year: Never true    Ran Out of Food in the Last Year: Never true  Transportation Needs: No Transportation Needs (02/17/2023)   PRAPARE - Administrator, Civil Service (Medical): No    Lack of Transportation (Non-Medical): No  Physical Activity: Inactive (02/17/2023)   Exercise Vital Sign    Days of Exercise per Week: 0 days    Minutes of Exercise per Session: 0 min  Stress: No Stress Concern Present (02/17/2023)   [employer] of Occupational Health - Occupational Stress Questionnaire    Feeling of Stress : Not at all  Social Connections: Moderately Integrated (02/17/2023)   Social Connection and Isolation Panel [NHANES]    Frequency of Communication with Friends and Family: More than three times a week    Frequency of Social Gatherings with Friends and Family: Twice a week    Attends Religious Services: More than 4 times per year    Active Member of [employer] or Organizations: No    Attends Banker Meetings: Never    Marital Status: Married  Catering manager Violence: Not At Risk (02/17/2023)   Humiliation, Afraid, Rape, and Kick questionnaire    Fear of Current or Ex-Partner: No    Emotionally Abused: No    Physically Abused: No    Sexually Abused: No                                                                                                  Objective:  Physical Exam: BP 118/72 (BP Location: Left Arm, Patient Position: Sitting, Cuff Size: Large)   Pulse 69   Temp 98.2 F (36.8 C) (Oral)   Wt 199 lb 3.2 oz (90.4 kg)   SpO2  99%   BMI 29.85 kg/m     Physical Exam Constitutional:      Appearance: Normal appearance.  HENT:     Head: Normocephalic and atraumatic.     Right Ear: Hearing, tympanic membrane and ear canal normal.     Left Ear: Hearing, tympanic membrane and ear canal normal.     Nose: Congestion present.     Right Sinus: No maxillary sinus tenderness or frontal sinus  tenderness.     Left Sinus: No maxillary sinus tenderness or frontal sinus tenderness.  Eyes:     General: No scleral icterus.       Right eye: No discharge.        Left eye: No discharge.     Extraocular Movements: Extraocular movements intact.  Cardiovascular:     Rate and Rhythm: Normal rate and regular rhythm.     Heart sounds: Normal heart sounds.  Pulmonary:     Effort: Pulmonary effort is normal.     Breath sounds: Normal breath sounds.  Abdominal:     Palpations: Abdomen is soft.     Tenderness: There is no abdominal tenderness.  Skin:    General: Skin is warm.     Findings: No rash.  Neurological:     General: No focal deficit present.     Mental Status: He is alert.     Cranial Nerves: No cranial nerve deficit.  Psychiatric:        Mood and Affect: Mood normal.        Behavior: Behavior normal.        Thought Content: Thought content normal.        Judgment: Judgment normal.     MR KNEE LEFT WO CONTRAST  Result Date: 05/09/2023 CLINICAL DATA:  Fall, knee trauma EXAM: MRI OF THE LEFT KNEE WITHOUT CONTRAST TECHNIQUE: Multiplanar, multisequence MR imaging of the knee was performed. No intravenous contrast was administered. COMPARISON:  Radiographs 04/13/2023 and MRI report dated 07/23/2021 FINDINGS: MENISCI Medial meniscus: Degenerative tearing of the midbody and posterior horn, especially involving the free edge of the midbody. Lateral meniscus: Poor definition of the root of the posterior horn lateral meniscus suspicious for tear. LIGAMENTS Cruciates: Expanded ACL with internal fluid signal favoring ACL cyst. Collaterals:  Unremarkable CARTILAGE Patellofemoral: Mild chondral thinning along portion its of the patellar facets. Medial: Prominent degenerative chondral thinning. Mild marginal spurring. Lateral:  Mild chondral thinning. Joint:  Small knee joint effusion. Popliteal Fossa: Infiltrative edema in the popliteal region. Small Baker's cyst. Extensor Mechanism:   Unremarkable Bones: No significant extra-articular osseous abnormalities identified. Other: No supplemental non-categorized findings. IMPRESSION: 1. Degenerative tearing of the midbody and posterior horn medial meniscus, especially involving the free edge of the midbody. 2. Poor definition of the root of the posterior horn lateral meniscus suspicious for tear. 3. Expanded ACL with internal fluid signal favoring ACL cyst. 4. Small knee joint effusion. 5. Small Baker's cyst. 6. Infiltrative edema in the popliteal region. 7. 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 Electronically Signed   By: Gaylyn Rong M.D.   On: 05/09/2023 08:25   MR HIP LEFT WO CONTRAST  Result Date: 05/09/2023 CLINICAL DATA:  Left knee and hip pain after fall 2 weeks prior to imaging. EXAM: MR OF THE LEFT HIP WITHOUT CONTRAST TECHNIQUE: Multiplanar, multisequence MR imaging was performed. No intravenous contrast was administered. COMPARISON:  Radiographs 04/13/2023 FINDINGS: Bones: Nondisplaced intertrochanteric fracture of the left hip as shown for example on image 24 series 2.  Surrounding marrow edema. Mild spurring of both femoral heads and acetabula. Articular cartilage and labrum Articular cartilage:  Unremarkable Labrum:  Grossly unremarkable Joint or bursal effusion Joint effusion:  Absent Bursae: No regional bursitis Muscles and tendons Muscles and tendons: Low-grade edema in musculature adjacent to the fracture site including the proximal vastus intermedius and lateralis musculature as well as the left hip adductor musculature. Other findings Miscellaneous: Lower lumbar spondylosis and degenerative disc disease. IMPRESSION: 1. Nondisplaced intertrochanteric fracture of the left hip with surrounding marrow edema. 2. Low-grade edema in musculature adjacent to the fracture site including the proximal vastus intermedius and lateralis musculature as well as the left hip adductor musculature. 3.  Lower lumbar spondylosis and degenerative disc disease. These results will be called to the ordering clinician or representative by the Radiologist Assistant, and communication documented in the PACS or [employer]. Electronically Signed   By: Gaylyn Rong M.D.   On: 05/09/2023 08:08   CT HEAD WO CONTRAST ( )  Result Date: 04/22/2023 CLINICAL DATA:  Head trauma EXAM: CT HEAD WITHOUT CONTRAST TECHNIQUE: Contiguous axial images were obtained from the base of the skull through the vertex without intravenous contrast. RADIATION DOSE REDUCTION: This exam was performed according to the departmental dose-optimization program which includes automated exposure control, adjustment of the mA and/or kV according to patient size and/or use of iterative reconstruction technique. COMPARISON:  04/13/2023 FINDINGS: Brain: There is no mass, hemorrhage or extra-axial collection. There is generalized atrophy without lobar predilection. Hypodensity of the white matter is most commonly associated with chronic microvascular disease. 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 Prominent extra-axial CSF spaces are unchanged. Vascular: Atherosclerotic calcification of the internal carotid arteries at the skull base. No abnormal hyperdensity of the major intracranial arteries or dural venous sinuses. Skull: Bilateral calvarial burr holes. Sinuses/Orbits: No fluid levels or advanced mucosal thickening of the visualized paranasal sinuses. No mastoid or middle ear effusion. Normal orbits. IMPRESSION: 1. No acute intracranial abnormality. 2. 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 Unchanged prominence of the extra-axial CSF spaces. Electronically Signed   By: Deatra Robinson M.D.   On: 04/22/2023 19:54   CT Cervical Spine Wo Contrast  Result Date: 04/13/2023 CLINICAL DATA:  89 year old male status post fall, struck back of head. Pain. EXAM: CT CERVICAL SPINE WITHOUT CONTRAST  TECHNIQUE: Multidetector CT imaging of the cervical spine was performed without intravenous contrast. Multiplanar CT image reconstructions were also generated. RADIATION DOSE REDUCTION: This exam was performed according to the departmental dose-optimization program which includes automated exposure control, adjustment of the mA and/or kV according to patient size and/or use of iterative reconstruction technique. COMPARISON:  Head CT today.  Cervical spine CT 11/11/2022. FINDINGS: Alignment: Normal cervical lordosis. Cervicothoracic junction alignment is within normal limits. Bilateral posterior element alignment is within normal limits. Skull base and vertebrae: Visualized skull base is intact. No atlanto-occipital dissociation. C1 and C2 appear intact and aligned. No acute osseous abnormality identified. Soft tissues and spinal canal: No prevertebral fluid or swelling. No visible canal hematoma. Mild cervical carotid calcified atherosclerosis. Otherwise negative visible noncontrast neck soft tissues. Disc levels: Mild for age cervical spine degeneration. Capacious CT appearance of the spinal canal. Upper chest: Visible upper thoracic levels appear intact, upper lungs appear clear. IMPRESSION: No acute traumatic injury identified in the cervical spine. Mild for age cervical spine degeneration. Electronically Signed   By: Odessa Fleming M.D.   On: 04/13/2023 12:40   DG Knee Complete 4  Views Left  Result Date: 04/13/2023 CLINICAL DATA:  89 year old male status post fall, struck back of head. Pain. EXAM: LEFT KNEE - COMPLETE 4+ VIEW COMPARISON:  None Available. FINDINGS: Bone mineralization is within normal limits for age. Normal for age joint spaces and alignment at the left knee. Patella appears intact. No joint effusion identified on cross-table lateral view. Calcified peripheral vascular disease. No discrete soft tissue injury. IMPRESSION: 1. No acute fracture or dislocation identified about the left knee. 2.  Calcified peripheral vascular disease. Electronically Signed   By: Odessa Fleming M.D.   On: 04/13/2023 12:37   DG Hip Unilat W or Wo Pelvis 2-3 Views Left  Result Date: 04/13/2023 CLINICAL DATA:  89 year old male status post fall, struck back of head. Pain. EXAM: DG HIP (WITH OR WITHOUT PELVIS) 2-3V LEFT COMPARISON:  CT Abdomen and Pelvis 11/07/2016. FINDINGS: AP pelvis at 1136 hours. Femoral heads normally located. Pelvis appears stable and intact. Bone mineralization is within normal limits for age. Nonobstructed bowel-gas pattern with retained stool mostly in the rectum. Grossly intact proximal right femur. Proximal left femur appears intact on dedicated AP and frogleg lateral views. No acute osseous abnormality identified. IMPRESSION: No acute fracture or dislocation identified about the left hip or pelvis. Electronically Signed   By: Odessa Fleming M.D.   On: 04/13/2023 12:35   DG Shoulder Left  Result Date: 04/13/2023 CLINICAL DATA:  89 year old male status post fall, struck back of head. Pain. EXAM: LEFT SHOULDER - 2+ VIEW COMPARISON:  None Available. FINDINGS: No glenohumeral joint dislocation. Superior subluxation of the left humeral head. Proximal left humerus, visible left clavicle and scapula appear intact. Visible left ribs appear intact. No discrete soft tissue injury. IMPRESSION: No acute fracture or dislocation identified about the left shoulder. Evidence of chronic rotator cuff degeneration. Electronically Signed   By: Odessa Fleming M.D.   On: 04/13/2023 12:34   CT Head Wo Contrast  Result Date: 04/13/2023 CLINICAL DATA:  89 year old male status post fall, struck back of head. Pain. EXAM: CT HEAD WITHOUT CONTRAST TECHNIQUE: Contiguous axial images were obtained from the base of the skull through the vertex without intravenous contrast. RADIATION DOSE REDUCTION: This exam was performed according to the departmental dose-optimization program which includes automated exposure control, adjustment of the mA  and/or kV according to patient size and/or use of iterative reconstruction technique. COMPARISON:  Brain MRI 11/04/2015.  Head CT 11/11/2022. FINDINGS: Brain: Chronic bilateral mostly subarachnoid space CSF. Trace if any chronic right side low-density subdural collection (series 6, image 30). No intracranial mass effect or midline shift. No acute intracranial hemorrhage identified. Stable cerebral volume. Stable gray-white matter differentiation throughout the brain. No cortically based acute infarct identified. Vascular: Calcified atherosclerosis at the skull base. No suspicious intracranial vascular hyperdensity. Skull: Multiple bilateral chronic burr holes. No acute osseous abnormality identified. Sinuses/Orbits: Visualized paranasal sinuses and mastoids are stable and well aerated. Other: No acute orbit or scalp sq a at mild left posterosuperior scalp hematoma or contusion series 5, image 45. Underlying calvarium appears stable, intact. Stable orbits soft tissues. IMPRESSION: 1. Mild left posterior scalp hematoma. No acute skull fracture. 2. No acute intracranial abnormality. Stable chronic extra-axial low-density fluid (mostly subarachnoid space) and previous bilateral cranial burr holes. Electronically Signed   By: Odessa Fleming M.D.   On: 04/13/2023 12:33    Recent Results (from the past 2160 hour(s))  POC COVID-19 BinaxNow     Status: None   Collection Time: 05/27/23  3:13 PM  Result  Value Ref Range   SARS Coronavirus 2 Ag Negative Negative  POCT Influenza A/B     Status: None   Collection Time: 05/27/23  3:14 PM  Result Value Ref Range   Influenza A, POC Negative Negative   Influenza B, POC Negative Negative        Garner Nash, MD, MS

## 2023-06-12 ENCOUNTER — Encounter: Payer: Self-pay | Admitting: Physical Therapy

## 2023-06-12 ENCOUNTER — Ambulatory Visit: Payer: Medicare Other | Attending: Family Medicine | Admitting: Physical Therapy

## 2023-06-12 DIAGNOSIS — R296 Repeated falls: Secondary | ICD-10-CM | POA: Insufficient documentation

## 2023-06-12 DIAGNOSIS — R2681 Unsteadiness on feet: Secondary | ICD-10-CM | POA: Insufficient documentation

## 2023-06-12 NOTE — Therapy (Signed)
OUTPATIENT PHYSICAL THERAPY LOWER EXTREMITY TREATMENT   Patient Name: Tim Morgan MRN: [id number] DOB:February 01, 1933, 89 y.o., male Today's Date: 06/12/2023  END OF SESSION:  PT End of Session - 06/12/23 0930     Visit Number 2    Date for PT Re-Evaluation 07/30/23    PT Start Time 0930    PT Stop Time 1015    PT Time Calculation (min) 45 min    Activity Tolerance Patient tolerated treatment well    Behavior During Therapy [facility] for tasks assessed/performed             Past Medical History:  Diagnosis Date   Acute bronchitis    COPD (chronic obstructive pulmonary disease) (HCC)    Diabetes mellitus    Edema of left lower extremity 07/03/2012   venous doppler-no evidence of thrombus or thrombophlebitis,left gsv valvular insufficency throughout the vein,left SSV appeared patent wiyh no venous insuffiency, left popliteal area questionable rupture Baker's cyst this was considered a abnormal doppler   GERD (gastroesophageal reflux disease)    History of back injury 07/01/1988   L 4 to , no surgery healed on own   Hypertension 12/06/2011   echo-EF 65-70%    Left ventricular hypertrophy    Melanoma (HCC)    Vitamin D deficiency    Past Surgical History:  Procedure Laterality Date   ANKLE SURGERY Left 07/01/1988   APPENDECTOMY  age 76   BRAIN SURGERY  07/01/1993   concusion   CATARACT EXTRACTION Right 04/03/2021   CATARACT EXTRACTION Left 05/01/2021   HERNIA REPAIR     x 2   KNEE ARTHROSCOPY Left 11/18/2012   Procedure: LEFT ARTHROSCOPY KNEE WITH DEBRIDEMENT AND CHRONDROLPLASTY;  Surgeon: Loanne Drilling, MD;  Location: [facility];  Service: Orthopedics;  Laterality: Left;   KNEE SURGERY     NASAL SINUS SURGERY Bilateral    to open up ear tubes   Patient Active Problem List   Diagnosis Date Noted   Viral URI with cough 05/27/2023   Neck pain 04/22/2023   Left hip pain 04/22/2023   Blurry vision, bilateral 04/22/2023   Acute pain of left knee 04/22/2023   H/O local  excision of skin lesion on right lower leg 11/22/2022   Rash 11/22/2022   Vitamin D deficiency 11/22/2022   Cardiomyopathy (HCC) 11/22/2022   RBBB 11/22/2022   Acute cough 11/22/2022   Chest wall pain 11/22/2022   Class 1 obesity with serious comorbidity in adult 11/22/2022   Hyperlipidemia 11/22/2022   Acute pain of right shoulder 11/22/2022   Fall 11/22/2022   Preop general physical exam 11/22/2022   Venous stasis of both lower extremities 09/18/2022   Cellulitis of face 09/18/2022   Type 2 diabetes mellitus without complication, with long-term current use of insulin (HCC) 09/18/2022   Essential hypertension 09/10/2014   Upper airway cough syndrome 09/10/2014   Mild persistent asthma in adult without complication 08/17/2014   Chronic sinusitis/chronic cough  05/19/2014   Acute medial meniscal tear 11/18/2012    PCP: Fanny Bien, MD  REFERRING PROVIDER: same  REFERRING DIAG: recurrent falls, L hip and L knee pain  THERAPY DIAG:  Unsteady gait  Repeated falls  Rationale for Evaluation and Treatment: Rehabilitation  ONSET DATE: 10/2022  SUBJECTIVE:   SUBJECTIVE STATEMENT: Patient not sure he wants to continue PT, we talked about the foot drop, the need to stay active and moving, safety and issues that could occur if he fell.    Reports 2 falls in the  last year.  Most recently fell in his yard 3 weeks ago,when his wife fell on him and knocked him down.  Initially had much L hip and knee pain but now that pain is much improved.  Not noticing periods of staggering , does note that he uses chairs with rails on them , avoids chairs without arm rests. Has obtained st cane 2 weeks ago after his PCP convinced him he needed it.  PERTINENT HISTORY: Larey Seat x 2 this year, referred by PCP for gait training, fall prevention training and pain L hip and knee after recent fall PAIN:  Are you having pain? Yes: NPRS scale: 0 to3/10 Pain location: L lat knee and L hip, lat thigh Pain  description: intermittent sharp Aggravating factors: planting to turn Relieving factors: resting, wearing compression stockings  PRECAUTIONS: None  RED FLAGS: None   WEIGHT BEARING RESTRICTIONS: No  FALLS:  Has patient fallen in last 6 months? Yes. Number of falls 2  LIVING ENVIRONMENT: Lives with: lives with their family and lives with their spouse Lives in: House/apartment Stairs: No Has following equipment at home: Single point cane  OCCUPATION: retired from career in Company secretary  PLOF: Independent  PATIENT GOALS: prevent falls, be able to assist my wife with her walking, et   NEXT MD VISIT: unknown  OBJECTIVE:  Note: Objective measures were completed at Evaluation unless otherwise noted.  DIAGNOSTIC FINDINGS: CT negative head MRI normal L knee and hip    COGNITION: Overall cognitive status: Within functional limits for tasks assessed     SENSATION: Not tested  EDEMA: Patient reports B lower leg edema, has begun using compression stockings  MUSCLE LENGTH: Hamstrings: Right wfl deg; Left wfl deg POSTURE: rounded shoulders and forward head  PALPATION: na  LOWER EXTREMITY ROM:all ROM screening wnl B LEs  LOWER EXTREMITY MMT: MMT wfl today Coordination testing LE's wnl    FUNCTIONAL TESTS:  30 seconds chair stand test  10 BERG:  43/56  GAIT: Distance walked: in clinic up to 70' Assistive device utilized: Single point cane Level of assistance: Modified independence Comments: B foot slap   TODAY'S TREATMENT:                                                                                                                              DATE:  06/12/23 Nustep level 4 x 6 minutes Toe taps, heel raises  HEP and performed Education on what PT can do to help, spoke with his wife and he did agree to come 1x/week  05/07/23:  evaluation    PATIENT EDUCATION:  Education details: POC, goals Person educated: Patient Education method: Explanation,  Demonstration, Tactile cues, Verbal cues, and Handouts Education comprehension: verbalized understanding  HOME EXERCISE PROGRAM: Access Code: [email or web address] URL: [email or web address] Date: 06/12/2023 Prepared by: Stacie Glaze  Exercises - Side Stepping with Counter Support  - 1 x daily - 7 x weekly - 1 sets - 10 reps - 3 hold -  Standing Hip Abduction with Counter Support  - 1 x daily - 7 x weekly - 1 sets - 10 reps - 3 hold - Heel Toe Raises with Unilateral Counter Support  - 1 x daily - 7 x weekly - 1 sets - 10 reps - 3 hold - Standing March with Unilateral Counter Support  - 1 x daily - 7 x weekly - 1 sets - 10 reps - 3 hold - Standing Hip Extension with Unilateral Counter Support  - 1 x daily - 7 x weekly - 1 sets - 10 reps - 3 hold  ASSESSMENT:  CLINICAL IMPRESSION: Patient is a 89 y.o. male who was evaluated  today by skilled physical therapy for recent falls as well as resultant L knee and L lat hip pain.  Today his evaluation focused on his stability and fall risk  he scored in mod fall risk category with BERG balance score of 43/56. Noted B foot slap with gait.    He reports minimal pain now in these areas.  He is now using the cane and reports that he has learned to hang it up, after much discussion with him and his wife we agreed to see him 1x/week as he felt he did not need help I did point out the drop feet and the Berg score.  He should benefit from skilled PT to address his stability and balance as well as further assess L knee and thigh pain.   OBJECTIVE IMPAIRMENTS: decreased balance, decreased knowledge of condition, decreased knowledge of use of DME, and pain.   ACTIVITY LIMITATIONS: squatting, transfers, and locomotion level  PARTICIPATION LIMITATIONS: community activity, yard work, and church  PERSONAL FACTORS: Age, Behavior pattern, Fitness, and 1-2 comorbidities: DM, COPD  are also affecting patient's functional outcome.   REHAB POTENTIAL:  Good  CLINICAL DECISION MAKING: Stable/uncomplicated  EVALUATION COMPLEXITY: Low   GOALS: Goals reviewed with patient? Yes  SHORT TERM GOALS: Target date: 3 weeks 05/28/23  1.  IHEP Goal status: issued 06/12/23   LONG TERM GOALS: Target date: 07/30/23  Sharlene Motts score improve from 43/56 to 50/56  Baseline:  Goal status: INITIAL  2.  30 sec sit to stand greater than 12 Baseline: 10 Goal status: INITIAL  3.  I and safe curb negotiation with st cane Baseline: not assessed yet Goal status: INITIAL   PLAN:  PT FREQUENCY: 1-2x/week  PT DURATION: 12 weeks  PLANNED INTERVENTIONS: 97110-Therapeutic exercises, 97530- Therapeutic activity, 97112- Neuromuscular re-education, 97535- Self Care, 16109- Manual therapy, and [phone]- Gait training  PLAN FOR NEXT SESSION: we agreed to have him come in 1x/week to work on strength and balance and safety   [employer], PT 06/12/2023, 9:31 AM

## 2023-06-26 ENCOUNTER — Ambulatory Visit: Payer: Medicare Other

## 2023-06-26 ENCOUNTER — Telehealth: Payer: Self-pay

## 2023-06-26 NOTE — Telephone Encounter (Signed)
While reviewing [place] schedule I noticed that patient was scheduled for shingles vaccine due to exposure to shingles yesterday. I reviewed patient's chart and noticed that he was up to date with shingles vaccine series. Spoke with [name], FNP and she agreed that appointment wasn't needed. Spoke with [name] and verified [place]'s dob. I advised patient's wife of message above and she verbalized understanding and appointment was canceled.

## 2023-07-07 ENCOUNTER — Ambulatory Visit: Payer: Medicare Other | Attending: Family Medicine | Admitting: Physical Therapy

## 2023-07-07 ENCOUNTER — Encounter: Payer: Self-pay | Admitting: Physical Therapy

## 2023-07-07 DIAGNOSIS — R296 Repeated falls: Secondary | ICD-10-CM | POA: Insufficient documentation

## 2023-07-07 DIAGNOSIS — R2681 Unsteadiness on feet: Secondary | ICD-10-CM | POA: Insufficient documentation

## 2023-07-07 NOTE — Therapy (Signed)
 OUTPATIENT PHYSICAL THERAPY LOWER EXTREMITY TREATMENT   Patient Name: Tim Morgan MRN: [id number] DOB:1932/09/21, 89 y.o., male Today's Date: 07/07/2023  END OF SESSION:  PT End of Session - 07/07/23 1104     Visit Number 3    Date for PT Re-Evaluation 07/30/23    PT Start Time 1100    PT Stop Time 1140    PT Time Calculation (min) 40 min    Activity Tolerance Patient tolerated treatment well    Behavior During Therapy [facility] for tasks assessed/performed            Past Medical History:  Diagnosis Date   Acute bronchitis    COPD (chronic obstructive pulmonary disease) (HCC)    Diabetes mellitus    Edema of left lower extremity 07/03/2012   venous doppler-no evidence of thrombus or thrombophlebitis,left gsv valvular insufficency throughout the vein,left SSV appeared patent wiyh no venous insuffiency, left popliteal area questionable rupture Baker's cyst this was considered a abnormal doppler   GERD (gastroesophageal reflux disease)    History of back injury 07/01/1988   L 4 to , no surgery healed on own   Hypertension 12/06/2011   echo-EF 65-70%    Left ventricular hypertrophy    Melanoma (HCC)    Vitamin D  deficiency    Past Surgical History:  Procedure Laterality Date   ANKLE SURGERY Left 07/01/1988   APPENDECTOMY  age 78   BRAIN SURGERY  07/01/1993   concusion   CATARACT EXTRACTION Right 04/03/2021   CATARACT EXTRACTION Left 05/01/2021   HERNIA REPAIR     x 2   KNEE ARTHROSCOPY Left 11/18/2012   Procedure: LEFT ARTHROSCOPY KNEE WITH DEBRIDEMENT AND CHRONDROLPLASTY;  Surgeon: Dempsey LULLA Moan, MD;  Location: [facility];  Service: Orthopedics;  Laterality: Left;   KNEE SURGERY     NASAL SINUS SURGERY Bilateral    to open up ear tubes   Patient Active Problem List   Diagnosis Date Noted   Viral URI with cough 05/27/2023   Neck pain 04/22/2023   Left hip pain 04/22/2023   Blurry vision, bilateral 04/22/2023   Acute pain of left knee 04/22/2023   H/O local  excision of skin lesion on right lower leg 11/22/2022   Rash 11/22/2022   Vitamin D  deficiency 11/22/2022   Cardiomyopathy (HCC) 11/22/2022   RBBB 11/22/2022   Acute cough 11/22/2022   Chest wall pain 11/22/2022   Class 1 obesity with serious comorbidity in adult 11/22/2022   Hyperlipidemia 11/22/2022   Acute pain of right shoulder 11/22/2022   Fall 11/22/2022   Preop general physical exam 11/22/2022   Venous stasis of both lower extremities 09/18/2022   Cellulitis of face 09/18/2022   Type 2 diabetes mellitus without complication, with long-term current use of insulin (HCC) 09/18/2022   Essential hypertension 09/10/2014   Upper airway cough syndrome 09/10/2014   Mild persistent asthma in adult without complication 08/17/2014   Chronic sinusitis/chronic cough  05/19/2014   Acute medial meniscal tear 11/18/2012    PCP: Beverley Hummer, MD  REFERRING PROVIDER: same  REFERRING DIAG: recurrent falls, L hip and L knee pain  THERAPY DIAG:  Unsteady gait  Repeated falls  Rationale for Evaluation and Treatment: Rehabilitation  ONSET DATE: 10/2022  SUBJECTIVE:   SUBJECTIVE STATEMENT: Patient reports no pain in his hip. No additional falls  Reports 2 falls in the last year.  Most recently fell in his yard 3 weeks ago,when his wife fell on him and knocked him down.  Initially  had much L hip and knee pain but now that pain is much improved.  Not noticing periods of staggering , does note that he uses chairs with rails on them , avoids chairs without arm rests. Has obtained st cane 2 weeks ago after his PCP convinced him he needed it.  PERTINENT HISTORY: [place] x 2 this year, referred by PCP for gait training, fall prevention training and pain L hip and knee after recent fall PAIN:  Are you having pain? Yes: NPRS scale: 0 to3/10 Pain location: L lat knee and L hip, lat thigh Pain description: intermittent sharp Aggravating factors: planting to turn Relieving factors: resting,  wearing compression stockings  PRECAUTIONS: None  RED FLAGS: None   WEIGHT BEARING RESTRICTIONS: No  FALLS:  Has patient fallen in last 6 months? Yes. Number of falls 2  LIVING ENVIRONMENT: Lives with: lives with their family and lives with their spouse Lives in: House/apartment Stairs: No Has following equipment at home: Single point cane  OCCUPATION: retired from career in [employer]  PLOF: Independent  PATIENT GOALS: prevent falls, be able to assist my wife with her walking, et   NEXT MD VISIT: unknown  OBJECTIVE:  Note: Objective measures were completed at Evaluation unless otherwise noted.  DIAGNOSTIC FINDINGS: CT negative head MRI normal L knee and hip  COGNITION: Overall cognitive status: Within functional limits for tasks assessed     SENSATION: Not tested  EDEMA: Patient reports B lower leg edema, has begun using compression stockings  MUSCLE LENGTH: Hamstrings: Right wfl deg; Left wfl deg POSTURE: rounded shoulders and forward head  PALPATION: na  LOWER EXTREMITY ROM:all ROM screening wnl B LEs  LOWER EXTREMITY MMT: MMT wfl today Coordination testing LE's wnl  FUNCTIONAL TESTS:  30 seconds chair stand test  10 BERG:  43/56  GAIT: Distance walked: in clinic up to 70' Assistive device utilized: Single point cane Level of assistance: Modified independence Comments: B foot slap   TODAY'S TREATMENT:                                                                                                                              DATE:  07/07/23 NuStep L4 x 6 minutes Mini squats elevated mat with feet on air ex, 11 reps B calf stretch on step B heel raises on step, BUE support, 10 reps Wall bumps increasing distance from wall, up to approx 12 Parallel bars, step back with rotation and reach to touch wall behind, with control. Alternated sides x 10 each, very quick and stable. Ambulation with focus on upright posture and active trunk Ambulation in  all directions upon therapist's command, occasioanlly mildly unsteady, but no LOB.  06/12/23 Nustep level 4 x 6 minutes Toe taps, heel raises  HEP and performed Education on what PT can do to help, spoke with his wife and he did agree to come 1x/week  05/07/23:  evaluation    PATIENT EDUCATION:  Education details:  POC, goals Person educated: Patient Education method: Explanation, Demonstration, Tactile cues, Verbal cues, and Handouts Education comprehension: verbalized understanding  HOME EXERCISE PROGRAM: Access Code: [email or web address] URL: [email or web address] Date: 06/12/2023 Prepared by: Ozell Mainland  Exercises - Side Stepping with Counter Support  - 1 x daily - 7 x weekly - 1 sets - 10 reps - 3 hold - Standing Hip Abduction with Counter Support  - 1 x daily - 7 x weekly - 1 sets - 10 reps - 3 hold - Heel Toe Raises with Unilateral Counter Support  - 1 x daily - 7 x weekly - 1 sets - 10 reps - 3 hold - Standing March with Unilateral Counter Support  - 1 x daily - 7 x weekly - 1 sets - 10 reps - 3 hold - Standing Hip Extension with Unilateral Counter Support  - 1 x daily - 7 x weekly - 1 sets - 10 reps - 3 hold  ASSESSMENT:  CLINICAL IMPRESSION: Patient is a 89 y.o. male who was evaluated  today by skilled physical therapy for recent falls as well as resultant L knee and L lat hip pain.  He reports that his pain is much improved. Treatment focused on combination of balance training and lower body strengthening activities. He tolerated all with no reports of pain, rarely had some mild unsteadiness. He notes B foot slapping while ambulating. Demonstrated slightly improved control when he focused on upright posture during gait, but he does have weakness in B ankle Dorsiflexors.  OBJECTIVE IMPAIRMENTS: decreased balance, decreased knowledge of condition, decreased knowledge of use of DME, and pain.   ACTIVITY LIMITATIONS: squatting, transfers, and locomotion  level  PARTICIPATION LIMITATIONS: community activity, yard work, and church  PERSONAL FACTORS: Age, Behavior pattern, Fitness, and 1-2 comorbidities: DM, COPD  are also affecting patient's functional outcome.   REHAB POTENTIAL: Good  CLINICAL DECISION MAKING: Stable/uncomplicated  EVALUATION COMPLEXITY: Low   GOALS: Goals reviewed with patient? Yes  SHORT TERM GOALS: Target date: 3 weeks 05/28/23  1.  IHEP Goal status: issued 07/07/23-met   LONG TERM GOALS: Target date: 07/30/23  Lars score improve from 43/56 to 50/56  Baseline:  Goal status: INITIAL  2.  30 sec sit to stand greater than 12 Baseline: 10 Goal status: INITIAL  3.  I and safe curb negotiation with st cane Baseline: not assessed yet Goal status: INITIAL   PLAN:  PT FREQUENCY: 1-2x/week  PT DURATION: 12 weeks  PLANNED INTERVENTIONS: 97110-Therapeutic exercises, 97530- Therapeutic activity, 97112- Neuromuscular re-education, 97535- Self Care, [id number]- Manual therapy, and [phone]- Gait training  PLAN FOR NEXT SESSION: we agreed to have him come in 1x/week to work on strength and balance and safety   Devere CHRISTELLA Mean, PT 07/07/2023, 2:12 PM

## 2023-07-10 ENCOUNTER — Ambulatory Visit: Payer: Medicare Other | Admitting: Physical Therapy

## 2023-07-15 ENCOUNTER — Ambulatory Visit: Payer: Medicare Other | Admitting: Physical Therapy

## 2023-07-15 ENCOUNTER — Encounter: Payer: Self-pay | Admitting: Physical Therapy

## 2023-07-15 DIAGNOSIS — R2681 Unsteadiness on feet: Secondary | ICD-10-CM | POA: Diagnosis not present

## 2023-07-15 DIAGNOSIS — R296 Repeated falls: Secondary | ICD-10-CM

## 2023-07-15 NOTE — Therapy (Signed)
 OUTPATIENT PHYSICAL THERAPY LOWER EXTREMITY TREATMENT   Patient Name: ZEPPELIN COMMISSO MRN: [id number] DOB:Apr 13, 1933, 89 y.o., male Today's Date: 07/15/2023  END OF SESSION:  PT End of Session - 07/15/23 1016     Visit Number 4    Date for PT Re-Evaluation 07/30/23    PT Start Time 1012    PT Stop Time 1050    PT Time Calculation (min) 38 min    Activity Tolerance Patient tolerated treatment well    Behavior During Therapy WFL for tasks assessed/performed             Past Medical History:  Diagnosis Date   Acute bronchitis    COPD (chronic obstructive pulmonary disease) (HCC)    Diabetes mellitus    Edema of left lower extremity 07/03/2012   venous doppler-no evidence of thrombus or thrombophlebitis,left gsv valvular insufficency throughout the vein,left SSV appeared patent wiyh no venous insuffiency, left popliteal area questionable rupture Baker's cyst this was considered a abnormal doppler   GERD (gastroesophageal reflux disease)    History of back injury 07/01/1988   L 4 to , no surgery healed on own   Hypertension 12/06/2011   echo-EF 65-70%    Left ventricular hypertrophy    Melanoma (HCC)    Vitamin D  deficiency    Past Surgical History:  Procedure Laterality Date   ANKLE SURGERY Left 07/01/1988   APPENDECTOMY  age 68   BRAIN SURGERY  07/01/1993   concusion   CATARACT EXTRACTION Right 04/03/2021   CATARACT EXTRACTION Left 05/01/2021   HERNIA REPAIR     x 2   KNEE ARTHROSCOPY Left 11/18/2012   Procedure: LEFT ARTHROSCOPY KNEE WITH DEBRIDEMENT AND CHRONDROLPLASTY;  Surgeon: Dempsey LULLA Moan, MD;  Location: [facility];  Service: Orthopedics;  Laterality: Left;   KNEE SURGERY     NASAL SINUS SURGERY Bilateral    to open up ear tubes   Patient Active Problem List   Diagnosis Date Noted   Viral URI with cough 05/27/2023   Neck pain 04/22/2023   Left hip pain 04/22/2023   Blurry vision, bilateral 04/22/2023   Acute pain of left knee 04/22/2023   H/O local  excision of skin lesion on right lower leg 11/22/2022   Rash 11/22/2022   Vitamin D  deficiency 11/22/2022   Cardiomyopathy (HCC) 11/22/2022   RBBB 11/22/2022   Acute cough 11/22/2022   Chest wall pain 11/22/2022   Class 1 obesity with serious comorbidity in adult 11/22/2022   Hyperlipidemia 11/22/2022   Acute pain of right shoulder 11/22/2022   Fall 11/22/2022   Preop general physical exam 11/22/2022   Venous stasis of both lower extremities 09/18/2022   Cellulitis of face 09/18/2022   Type 2 diabetes mellitus without complication, with long-term current use of insulin (HCC) 09/18/2022   Essential hypertension 09/10/2014   Upper airway cough syndrome 09/10/2014   Mild persistent asthma in adult without complication 08/17/2014   Chronic sinusitis/chronic cough  05/19/2014   Acute medial meniscal tear 11/18/2012    PCP: Beverley Hummer, MD  REFERRING PROVIDER: same  REFERRING DIAG: recurrent falls, L hip and L knee pain  THERAPY DIAG:  Unsteady gait  Repeated falls  Rationale for Evaluation and Treatment: Rehabilitation  ONSET DATE: 10/2022  SUBJECTIVE:   SUBJECTIVE STATEMENT: Patient reports that he felt good after his last treatment.  Reports 2 falls in the last year.  Most recently fell in his yard 3 weeks ago,when his wife fell on him and knocked him down.  Initially had much L hip and knee pain but now that pain is much improved.  Not noticing periods of staggering , does note that he uses chairs with rails on them , avoids chairs without arm rests. Has obtained st cane 2 weeks ago after his PCP convinced him he needed it.  PERTINENT HISTORY: [place] x 2 this year, referred by PCP for gait training, fall prevention training and pain L hip and knee after recent fall PAIN:  Are you having pain? Yes: NPRS scale: 0 to3/10 Pain location: L lat knee and L hip, lat thigh Pain description: intermittent sharp Aggravating factors: planting to turn Relieving factors: resting,  wearing compression stockings  PRECAUTIONS: None  RED FLAGS: None   WEIGHT BEARING RESTRICTIONS: No  FALLS:  Has patient fallen in last 6 months? Yes. Number of falls 2  LIVING ENVIRONMENT: Lives with: lives with their family and lives with their spouse Lives in: House/apartment Stairs: No Has following equipment at home: Single point cane  OCCUPATION: retired from career in [employer]  PLOF: Independent  PATIENT GOALS: prevent falls, be able to assist my wife with her walking, et   NEXT MD VISIT: unknown  OBJECTIVE:  Note: Objective measures were completed at Evaluation unless otherwise noted.  DIAGNOSTIC FINDINGS: CT negative head MRI normal L knee and hip  COGNITION: Overall cognitive status: Within functional limits for tasks assessed     SENSATION: Not tested  EDEMA: Patient reports B lower leg edema, has begun using compression stockings  MUSCLE LENGTH: Hamstrings: Right wfl deg; Left wfl deg POSTURE: rounded shoulders and forward head  PALPATION: na  LOWER EXTREMITY ROM:all ROM screening wnl B LEs  LOWER EXTREMITY MMT: MMT wfl today Coordination testing LE's wnl  FUNCTIONAL TESTS:  30 seconds chair stand test  10 BERG:  43/56  GAIT: Distance walked: in clinic up to 70' Assistive device utilized: Single point cane Level of assistance: Modified independence Comments: B foot slap   TODAY'S TREATMENT:                                                                                                                              DATE:  07/15/23 NuStep L4 x 6 minutes. Standing in stride in parallel bars, static, then with forward/back arm swing, then forward and back weight shifts, no UE support, 15 x each activity with each leg in front. He required min UE support with weight shifts due to balance, improved  with practice. Standing Heel tap/Toe tap with opposite leg 2 x 10 with each leg B side stepping on air ex plank, occasional UE support, x 4 each  way Quick steps side to side and forward and back over Theraband placed on the floor. Increased speed with repetition, in parallel bars for safety, but used UE only 1-2 times Ambulation while flipping a ball from hand to hand, 170', S, no unsteadiness Standing in doorway sideways. Slide one foot forward and back x  10 each way, occasional UE support.  07/07/23 NuStep L4 x 6 minutes Mini squats elevated mat with feet on air ex, 11 reps B calf stretch on step B heel raises on step, BUE support, 10 reps Wall bumps increasing distance from wall, up to approx 12 Parallel bars, step back with rotation and reach to touch wall behind, with control. Alternated sides x 10 each, very quick and stable. Ambulation with focus on upright posture and active trunk Ambulation in all directions upon therapist's command, occasioanlly mildly unsteady, but no LOB.  06/12/23 Nustep level 4 x 6 minutes Toe taps, heel raises  HEP and performed Education on what PT can do to help, spoke with his wife and he did agree to come 1x/week  05/07/23:  evaluation    PATIENT EDUCATION:  Education details: POC, goals Person educated: Patient Education method: Explanation, Demonstration, Tactile cues, Verbal cues, and Handouts Education comprehension: verbalized understanding  HOME EXERCISE PROGRAM: Access Code: [email or web address] URL: [email or web address] Date: 06/12/2023 Prepared by: Ozell Mainland  Exercises - Side Stepping with Counter Support  - 1 x daily - 7 x weekly - 1 sets - 10 reps - 3 hold - Standing Hip Abduction with Counter Support  - 1 x daily - 7 x weekly - 1 sets - 10 reps - 3 hold - Heel Toe Raises with Unilateral Counter Support  - 1 x daily - 7 x weekly - 1 sets - 10 reps - 3 hold - Standing March with Unilateral Counter Support  - 1 x daily - 7 x weekly - 1 sets - 10 reps - 3 hold - Standing Hip Extension with Unilateral Counter Support  - 1 x daily - 7 x weekly - 1 sets - 10 reps - 3  hold  ASSESSMENT:  CLINICAL IMPRESSION: Patient is a 89 y.o. male who was evaluated  today by skilled physical therapy for recent falls as well as resultant L knee and L lat hip pain.  He reports that his pain is much improved. Treatment focused on combination of balance training and lower body strengthening activities. He tolerated a progression of activities with increased complexity and challenge to balance. Occasional unsteadiness, but improved with repetition of each exercise. Minimal rest breaks, no SOB or fatigue noted.  OBJECTIVE IMPAIRMENTS: decreased balance, decreased knowledge of condition, decreased knowledge of use of DME, and pain.   ACTIVITY LIMITATIONS: squatting, transfers, and locomotion level  PARTICIPATION LIMITATIONS: community activity, yard work, and church  PERSONAL FACTORS: Age, Behavior pattern, Fitness, and 1-2 comorbidities: DM, COPD  are also affecting patient's functional outcome.   REHAB POTENTIAL: Good  CLINICAL DECISION MAKING: Stable/uncomplicated  EVALUATION COMPLEXITY: Low   GOALS: Goals reviewed with patient? Yes  SHORT TERM GOALS: Target date: 3 weeks 05/28/23  1.  IHEP Goal status: issued 07/07/23-met   LONG TERM GOALS: Target date: 07/30/23  Lars score improve from 43/56 to 50/56  Baseline:  Goal status: INITIAL  2.  30 sec sit to stand greater than 12 Baseline: 10 Goal status: INITIAL  3.  I and safe curb negotiation with st cane Baseline: not assessed yet Goal status: 07/15/23- Ongoing balance and strength training. Ongoing.   PLAN:  PT FREQUENCY: 1-2x/week  PT DURATION: 12 weeks  PLANNED INTERVENTIONS: 97110-Therapeutic exercises, 97530- Therapeutic activity, [id number]- Neuromuscular re-education, 97535- Self Care, [id number]- Manual therapy, and [phone]- Gait training  PLAN FOR NEXT SESSION: we agreed to have him come in 1x/week to work on strength and balance and safety   Devere  CHRISTELLA Mean, DPT 07/15/2023, 11:00 AM

## 2023-07-21 ENCOUNTER — Ambulatory Visit: Payer: Medicare Other | Admitting: Physical Therapy

## 2023-07-21 ENCOUNTER — Encounter: Payer: Self-pay | Admitting: Physical Therapy

## 2023-07-21 DIAGNOSIS — R296 Repeated falls: Secondary | ICD-10-CM

## 2023-07-21 DIAGNOSIS — R2681 Unsteadiness on feet: Secondary | ICD-10-CM

## 2023-07-21 NOTE — Therapy (Signed)
OUTPATIENT PHYSICAL THERAPY LOWER EXTREMITY TREATMENT   Patient Name: Tim Morgan MRN: [id number] DOB:[birth year], 89 y.o., male Today's Date: 07/21/2023  END OF SESSION:  PT End of Session - 07/21/23 1059     Visit Number 5    Date for PT Re-Evaluation 07/30/23    PT Start Time 1059    PT Stop Time 1145    PT Time Calculation (min) 46 min    Activity Tolerance Patient tolerated treatment well    Behavior During Therapy [facility] for tasks assessed/performed             Past Medical History:  Diagnosis Date   Acute bronchitis    COPD (chronic obstructive pulmonary disease) (HCC)    Diabetes mellitus    Edema of left lower extremity 07/03/2012   venous doppler-no evidence of thrombus or thrombophlebitis,left gsv valvular insufficency throughout the vein,left SSV appeared patent wiyh no venous insuffiency, left popliteal area questionable rupture Baker's cyst this was considered a abnormal doppler   GERD (gastroesophageal reflux disease)    History of back injury 07/01/1988   L 4 to , no surgery healed on own   Hypertension 12/06/2011   echo-EF 65-70%    Left ventricular hypertrophy    Melanoma (HCC)    Vitamin D deficiency    Past Surgical History:  Procedure Laterality Date   ANKLE SURGERY Left 07/01/1988   APPENDECTOMY  age 18   BRAIN SURGERY  07/01/1993   concusion   CATARACT EXTRACTION Right 04/03/2021   CATARACT EXTRACTION Left 05/01/2021   HERNIA REPAIR     x 2   KNEE ARTHROSCOPY Left 11/18/2012   Procedure: LEFT ARTHROSCOPY KNEE WITH DEBRIDEMENT AND CHRONDROLPLASTY;  Surgeon: Loanne Drilling, MD;  Location: [facility];  Service: Orthopedics;  Laterality: Left;   KNEE SURGERY     NASAL SINUS SURGERY Bilateral    to open up ear tubes   Patient Active Problem List   Diagnosis Date Noted   Viral URI with cough 05/27/2023   Neck pain 04/22/2023   Left hip pain 04/22/2023   Blurry vision, bilateral 04/22/2023   Acute pain of left knee 04/22/2023   H/O local  excision of skin lesion on right lower leg 11/22/2022   Rash 11/22/2022   Vitamin D deficiency 11/22/2022   Cardiomyopathy (HCC) 11/22/2022   RBBB 11/22/2022   Acute cough 11/22/2022   Chest wall pain 11/22/2022   Class 1 obesity with serious comorbidity in adult 11/22/2022   Hyperlipidemia 11/22/2022   Acute pain of right shoulder 11/22/2022   Fall 11/22/2022   Preop general physical exam 11/22/2022   Venous stasis of both lower extremities 09/18/2022   Cellulitis of face 09/18/2022   Type 2 diabetes mellitus without complication, with long-term current use of insulin (HCC) 09/18/2022   Essential hypertension 09/10/2014   Upper airway cough syndrome 09/10/2014   Mild persistent asthma in adult without complication 08/17/2014   Chronic sinusitis/chronic cough  05/19/2014   Acute medial meniscal tear 11/18/2012    PCP: Fanny Bien, MD  REFERRING PROVIDER: same  REFERRING DIAG: recurrent falls, L hip and L knee pain  THERAPY DIAG:  Unsteady gait  Repeated falls  Rationale for Evaluation and Treatment: Rehabilitation  ONSET DATE: 10/2022  SUBJECTIVE:   SUBJECTIVE STATEMENT: Patient reports that he is pretty sore, reports that he was doing some cleaning and was bent over most of the day yesterday  Reports 2 falls in the last year.  Most recently fell in his yard  3 weeks ago,when his wife fell on him and knocked him down.  Initially had much L hip and knee pain but now that pain is much improved.  Not noticing periods of staggering , does note that he uses chairs with rails on them , avoids chairs without arm rests. Has obtained st cane 2 weeks ago after his PCP convinced him he needed it.  PERTINENT HISTORY: Larey Seat x 2 this year, referred by PCP for gait training, fall prevention training and pain L hip and knee after recent fall PAIN:  Are you having pain? Yes: NPRS scale: 0 to3/10 Pain location: L lat knee and L hip, lat thigh Pain description: intermittent  sharp Aggravating factors: planting to turn Relieving factors: resting, wearing compression stockings  PRECAUTIONS: None  RED FLAGS: None   WEIGHT BEARING RESTRICTIONS: No  FALLS:  Has patient fallen in last 6 months? Yes. Number of falls 2  LIVING ENVIRONMENT: Lives with: lives with their family and lives with their spouse Lives in: House/apartment Stairs: No Has following equipment at home: Single point cane  OCCUPATION: retired from career in Company secretary  PLOF: Independent  PATIENT GOALS: prevent falls, be able to assist my wife with her walking, et   NEXT MD VISIT: unknown  OBJECTIVE:  Note: Objective measures were completed at Evaluation unless otherwise noted.  DIAGNOSTIC FINDINGS: CT negative head MRI normal L knee and hip  COGNITION: Overall cognitive status: Within functional limits for tasks assessed     SENSATION: Not tested  EDEMA: Patient reports B lower leg edema, has begun using compression stockings  MUSCLE LENGTH: Hamstrings: Right wfl deg; Left wfl deg POSTURE: rounded shoulders and forward head  PALPATION: na  LOWER EXTREMITY ROM:all ROM screening wnl B LEs  LOWER EXTREMITY MMT: MMT wfl today Coordination testing LE's wnl  FUNCTIONAL TESTS:  30 seconds chair stand test  10 BERG:  43/56  GAIT: Distance walked: in clinic up to 70' Assistive device utilized: Single point cane Level of assistance: Modified independence Comments: B foot slap   TODAY'S TREATMENT:                                                                                                                              DATE:  07/21/23 Calf stretches On rockerboard 2 ways in pbars Nustep level 5 x 6 minutes Side stepping Side stepping with ball toss Walking ball toss Direction changes Ball kicks Cone toe touches with SPC and HHA, then on the airex Leg curls 20# x10, 25# x10 Leg extension 5# 2x10 Ball toss on airex  07/15/23 NuStep L4 x 6 minutes. Standing in  stride in parallel bars, static, then with forward/back arm swing, then forward and back weight shifts, no UE support, 15 x each activity with each leg in front. He required min UE support with weight shifts due to balance, improved  with practice. Standing Heel tap/Toe tap with opposite leg 2 x 10 with each leg  B side stepping on air ex plank, occasional UE support, x 4 each way Quick steps side to side and forward and back over Theraband placed on the floor. Increased speed with repetition, in parallel bars for safety, but used UE only 1-2 times Ambulation while flipping a ball from hand to hand, 170', S, no unsteadiness Standing in doorway sideways. Slide one foot forward and back x 10 each way, occasional UE support.  07/07/23 NuStep L4 x 6 minutes Mini squats elevated mat with feet on air ex, 11 reps B calf stretch on step B heel raises on step, BUE support, 10 reps Wall bumps increasing distance from wall, up to approx 12" Parallel bars, step back with rotation and reach to touch wall behind, with control. Alternated sides x 10 each, very quick and stable. Ambulation with focus on upright posture and active trunk Ambulation in all directions upon therapist's command, occasioanlly mildly unsteady, but no LOB.  06/12/23 Nustep level 4 x 6 minutes Toe taps, heel raises  HEP and performed Education on what PT can do to help, spoke with his wife and he did agree to come 1x/week  05/07/23:  evaluation    PATIENT EDUCATION:  Education details: POC, goals Person educated: Patient Education method: Explanation, Demonstration, Tactile cues, Verbal cues, and Handouts Education comprehension: verbalized understanding  HOME EXERCISE PROGRAM: Access Code: [email or web address] URL: [email or web address] Date: 06/12/2023 Prepared by: Stacie Glaze  Exercises - Side Stepping with Counter Support  - 1 x daily - 7 x weekly - 1 sets - 10 reps - 3 hold - Standing Hip Abduction with Counter  Support  - 1 x daily - 7 x weekly - 1 sets - 10 reps - 3 hold - Heel Toe Raises with Unilateral Counter Support  - 1 x daily - 7 x weekly - 1 sets - 10 reps - 3 hold - Standing March with Unilateral Counter Support  - 1 x daily - 7 x weekly - 1 sets - 10 reps - 3 hold - Standing Hip Extension with Unilateral Counter Support  - 1 x daily - 7 x weekly - 1 sets - 10 reps - 3 hold  ASSESSMENT:  CLINICAL IMPRESSION: Patient is a 89 y.o. male who was evaluated  today by skilled physical therapy for recent falls as well as resultant L knee and L lat hip pain.  He reports that his pain is much improved.  He is a little sore from cleaning yesterday.  Treatment focused on combination of balance training and lower body strengthening activities. He tolerated a progression of activities with increased complexity and challenge to balance. Occasional unsteadiness, but improved with repetition of each exercise. He struggles with the dynamic surfaces and tends to lose balance to the back  OBJECTIVE IMPAIRMENTS: decreased balance, decreased knowledge of condition, decreased knowledge of use of DME, and pain.   ACTIVITY LIMITATIONS: squatting, transfers, and locomotion level  PARTICIPATION LIMITATIONS: community activity, yard work, and church  PERSONAL FACTORS: Age, Behavior pattern, Fitness, and 1-2 comorbidities: DM, COPD  are also affecting patient's functional outcome.   REHAB POTENTIAL: Good  CLINICAL DECISION MAKING: Stable/uncomplicated  EVALUATION COMPLEXITY: Low   GOALS: Goals reviewed with patient? Yes  SHORT TERM GOALS: Target date: 3 weeks 05/28/23  1.  IHEP Goal status: issued 07/07/23-met   LONG TERM GOALS: Target date: 07/30/23  Sharlene Motts score improve from 43/56 to 50/56  Baseline:  Goal status: INITIAL  2.  30 sec sit to stand greater than 12 Baseline: 10  Goal status: INITIAL  3.  I and safe curb negotiation with st cane Baseline: not assessed yet Goal status: 07/21/23- Ongoing  balance and strength training. Ongoing.   PLAN:  PT FREQUENCY: 1-2x/week  PT DURATION: 12 weeks  PLANNED INTERVENTIONS: 97110-Therapeutic exercises, 97530- Therapeutic activity, [id number]- Neuromuscular re-education, 97535- Self Care, 24401- Manual therapy, and [id number]- Gait training  PLAN FOR NEXT SESSION: will continue to progress strength and balance to advance his function and safety   Iona Beard, DPT 07/21/2023, 11:10 AM

## 2023-07-22 LAB — HM DIABETES EYE EXAM

## 2023-07-24 ENCOUNTER — Other Ambulatory Visit: Payer: Self-pay | Admitting: Family

## 2023-07-28 ENCOUNTER — Ambulatory Visit: Payer: Medicare Other | Admitting: Physical Therapy

## 2023-08-04 ENCOUNTER — Other Ambulatory Visit: Payer: Self-pay

## 2023-08-04 NOTE — Telephone Encounter (Signed)
 Please advise, see below.

## 2023-08-07 ENCOUNTER — Encounter: Payer: Self-pay | Admitting: Physical Therapy

## 2023-08-07 ENCOUNTER — Ambulatory Visit: Payer: Medicare Other | Attending: Family Medicine | Admitting: Physical Therapy

## 2023-08-07 ENCOUNTER — Telehealth: Payer: Self-pay | Admitting: Family Medicine

## 2023-08-07 DIAGNOSIS — R296 Repeated falls: Secondary | ICD-10-CM | POA: Insufficient documentation

## 2023-08-07 DIAGNOSIS — R2681 Unsteadiness on feet: Secondary | ICD-10-CM | POA: Insufficient documentation

## 2023-08-07 NOTE — Telephone Encounter (Signed)
 Prescription Request  08/07/2023  LOV: 05/27/2023  What is the name of the medication or equipment? baclofen  (LIORESAL ) 10 MG tablet [[id number]]   Have you contacted your pharmacy to request a refill? Yes   Which pharmacy would you like this sent to?  [facility] - [place], [place] - [address] [place] [address] Phone: [phone] Fax: [phone]    Patient notified that their request is being sent to the clinical staff for review and that they should receive a response within 2 business days.   Please advise at Mobile [phone] (mobile)

## 2023-08-07 NOTE — Therapy (Signed)
 OUTPATIENT PHYSICAL THERAPY LOWER EXTREMITY TREATMENT/PROGRESS NOTE/RECERT    Patient Name: Tim Morgan MRN: [id number] DOB:[birth year], 89 y.o., male Today's Date: 08/07/2023   Progress Note Reporting Period 05/06/24 to 08/07/23  See note below for Objective Data and Assessment of Progress/Goals.      END OF SESSION:  PT End of Session - 08/07/23 1455     Visit Number 6    Authorization Type MCR    Progress Note Due on Visit 16    PT Start Time 1432    PT Stop Time 1512    PT Time Calculation (min) 40 min    Activity Tolerance Patient tolerated treatment well    Behavior During Therapy WFL for tasks assessed/performed              Past Medical History:  Diagnosis Date   Acute bronchitis    COPD (chronic obstructive pulmonary disease) (HCC)    Diabetes mellitus    Edema of left lower extremity 07/03/2012   venous doppler-no evidence of thrombus or thrombophlebitis,left gsv valvular insufficency throughout the vein,left SSV appeared patent wiyh no venous insuffiency, left popliteal area questionable rupture Baker's cyst this was considered a abnormal doppler   GERD (gastroesophageal reflux disease)    History of back injury 07/01/1988   L 4 to , no surgery healed on own   Hypertension 12/06/2011   echo-EF 65-70%    Left ventricular hypertrophy    Melanoma (HCC)    Vitamin D  deficiency    Past Surgical History:  Procedure Laterality Date   ANKLE SURGERY Left 07/01/1988   APPENDECTOMY  age 65   BRAIN SURGERY  07/01/1993   concusion   CATARACT EXTRACTION Right 04/03/2021   CATARACT EXTRACTION Left 05/01/2021   HERNIA REPAIR     x 2   KNEE ARTHROSCOPY Left 11/18/2012   Procedure: LEFT ARTHROSCOPY KNEE WITH DEBRIDEMENT AND CHRONDROLPLASTY;  Surgeon: Dempsey LULLA Moan, MD;  Location: [facility];  Service: Orthopedics;  Laterality: Left;   KNEE SURGERY     NASAL SINUS SURGERY Bilateral    to open up ear tubes   Patient Active Problem List   Diagnosis Date Noted    Viral URI with cough 05/27/2023   Neck pain 04/22/2023   Left hip pain 04/22/2023   Blurry vision, bilateral 04/22/2023   Acute pain of left knee 04/22/2023   H/O local excision of skin lesion on right lower leg 11/22/2022   Rash 11/22/2022   Vitamin D  deficiency 11/22/2022   Cardiomyopathy (HCC) 11/22/2022   RBBB 11/22/2022   Acute cough 11/22/2022   Chest wall pain 11/22/2022   Class 1 obesity with serious comorbidity in adult 11/22/2022   Hyperlipidemia 11/22/2022   Acute pain of right shoulder 11/22/2022   Fall 11/22/2022   Preop general physical exam 11/22/2022   Venous stasis of both lower extremities 09/18/2022   Cellulitis of face 09/18/2022   Type 2 diabetes mellitus without complication, with long-term current use of insulin (HCC) 09/18/2022   Essential hypertension 09/10/2014   Upper airway cough syndrome 09/10/2014   Mild persistent asthma in adult without complication 08/17/2014   Chronic sinusitis/chronic cough  05/19/2014   Acute medial meniscal tear 11/18/2012    PCP: Beverley Hummer, MD  REFERRING PROVIDER: same  REFERRING DIAG: recurrent falls, L hip and L knee pain  THERAPY DIAG:  Unsteady gait  Repeated falls  Rationale for Evaluation and Treatment: Rehabilitation  ONSET DATE: 10/2022  SUBJECTIVE:   SUBJECTIVE STATEMENT:  No changes since  last time, I was not nearly as sick as my wife. Glad to be upright.     Reports 2 falls in the last year.  Most recently fell in his yard 3 weeks ago,when his wife fell on him and knocked him down.  Initially had much L hip and knee pain but now that pain is much improved.  Not noticing periods of staggering , does note that he uses chairs with rails on them , avoids chairs without arm rests. Has obtained st cane 2 weeks ago after his PCP convinced him he needed it.  PERTINENT HISTORY: [place] x 2 this year, referred by PCP for gait training, fall prevention training and pain L hip and knee after recent  fall PAIN:  Are you having pain? No 0/10 now   PRECAUTIONS: None  RED FLAGS: None   WEIGHT BEARING RESTRICTIONS: No  FALLS:  Has patient fallen in last 6 months? Yes. Number of falls 2  LIVING ENVIRONMENT: Lives with: lives with their family and lives with their spouse Lives in: House/apartment Stairs: No Has following equipment at home: Single point cane  OCCUPATION: retired from career in [employer]  PLOF: Independent  PATIENT GOALS: prevent falls, be able to assist my wife with her walking, et   NEXT MD VISIT: unknown  OBJECTIVE:  Note: Objective measures were completed at Evaluation unless otherwise noted.  DIAGNOSTIC FINDINGS: CT negative head MRI normal L knee and hip  COGNITION: Overall cognitive status: Within functional limits for tasks assessed     SENSATION: Not tested  EDEMA: Patient reports B lower leg edema, has begun using compression stockings  MUSCLE LENGTH: Hamstrings: Right wfl deg; Left wfl deg POSTURE: rounded shoulders and forward head  PALPATION: na  LOWER EXTREMITY ROM:all ROM screening wnl B LEs  LOWER EXTREMITY MMT: MMT wfl today Coordination testing LE's wnl  FUNCTIONAL TESTS:  30 seconds chair stand test  10; 08/07/23- 11.5 (11 and then stood up, 30 second timer went off before he could sit back down) BERG:  43/56; 08/07/23- 48/53  GAIT: Distance walked: in clinic up to 70' Assistive device utilized: Single point cane Level of assistance: Modified independence Comments: B foot slap   TODAY'S TREATMENT:                                                                                                                              DATE:   08/07/23  Nustep L5x8 minutes for warm up prior to assessments Alternating toe taps on blue foam pad tapping top of orange cone  SLS with one foot on solid ground/one foot on BOSU 3x30 seconds B  Tandem stance on blue foam pad 1x30 seconds B    Objective measures for recert/progress  note, education on progress towards all goals and POC moving forward. See above for objective measure specifics        07/21/23 Calf stretches On rockerboard 2 ways in pbars Nustep level 5 x  6 minutes Side stepping Side stepping with ball toss Walking ball toss Direction changes Ball kicks Cone toe touches with SPC and HHA, then on the airex Leg curls 20# x10, 25# x10 Leg extension 5# 2x10 Ball toss on airex  07/15/23 NuStep L4 x 6 minutes. Standing in stride in parallel bars, static, then with forward/back arm swing, then forward and back weight shifts, no UE support, 15 x each activity with each leg in front. He required min UE support with weight shifts due to balance, improved  with practice. Standing Heel tap/Toe tap with opposite leg 2 x 10 with each leg B side stepping on air ex plank, occasional UE support, x 4 each way Quick steps side to side and forward and back over Theraband placed on the floor. Increased speed with repetition, in parallel bars for safety, but used UE only 1-2 times Ambulation while flipping a ball from hand to hand, 170', S, no unsteadiness Standing in doorway sideways. Slide one foot forward and back x 10 each way, occasional UE support.  07/07/23 NuStep L4 x 6 minutes Mini squats elevated mat with feet on air ex, 11 reps B calf stretch on step B heel raises on step, BUE support, 10 reps Wall bumps increasing distance from wall, up to approx 12 Parallel bars, step back with rotation and reach to touch wall behind, with control. Alternated sides x 10 each, very quick and stable. Ambulation with focus on upright posture and active trunk Ambulation in all directions upon therapist's command, occasioanlly mildly unsteady, but no LOB.  06/12/23 Nustep level 4 x 6 minutes Toe taps, heel raises  HEP and performed Education on what PT can do to help, spoke with his wife and he did agree to come 1x/week  05/07/23:  evaluation    PATIENT EDUCATION:   Education details: POC, goals Person educated: Patient Education method: Explanation, Demonstration, Tactile cues, Verbal cues, and Handouts Education comprehension: verbalized understanding  HOME EXERCISE PROGRAM: Access Code: [email or web address] URL: [email or web address] Date: 06/12/2023 Prepared by: Ozell Mainland  Exercises - Side Stepping with Counter Support  - 1 x daily - 7 x weekly - 1 sets - 10 reps - 3 hold - Standing Hip Abduction with Counter Support  - 1 x daily - 7 x weekly - 1 sets - 10 reps - 3 hold - Heel Toe Raises with Unilateral Counter Support  - 1 x daily - 7 x weekly - 1 sets - 10 reps - 3 hold - Standing March with Unilateral Counter Support  - 1 x daily - 7 x weekly - 1 sets - 10 reps - 3 hold - Standing Hip Extension with Unilateral Counter Support  - 1 x daily - 7 x weekly - 1 sets - 10 reps - 3 hold  ASSESSMENT:  CLINICAL IMPRESSION:  Pt arrives today doing well, we focused on getting updated objective measures for recert/progress note. He is making progress with skilled PT services, has been out for a little while due to him and his wife being ill with flu but feeling a lot better now. Will plan to continue skilled PT services to work towards goals and assist in reaching optimal level of function moving forward with minimal fall risk.     OBJECTIVE IMPAIRMENTS: decreased balance, decreased knowledge of condition, decreased knowledge of use of DME, and pain.   ACTIVITY LIMITATIONS: squatting, transfers, and locomotion level  PARTICIPATION LIMITATIONS: community activity, yard work, and church  PERSONAL FACTORS: Age, Behavior pattern, Fitness, and  1-2 comorbidities: DM, COPD  are also affecting patient's functional outcome.   REHAB POTENTIAL: Good  CLINICAL DECISION MAKING: Stable/uncomplicated  EVALUATION COMPLEXITY: Low   GOALS: Goals reviewed with patient? Yes  SHORT TERM GOALS: Target date: 3 weeks 05/28/23 did not update STG as it  was already met   1.  IHEP Goal status: issued 07/07/23-met   LONG TERM GOALS: Target date: 09/18/2023    Lars score improve from 43/56 to 50/56  Baseline:  Goal status: ONGOING 08/27/23 48  2.  30 sec sit to stand greater than 12 Baseline: 10 Goal status: ONGOING 08/07/23- 11.5  3.  I and safe curb negotiation with st cane Baseline: not assessed yet Goal status: ONGOING 08/07/23- min guard, unsteady no device (not using SPC on his own so did without a device)   PLAN:  PT FREQUENCY: 2x/week  PT DURATION: 6 weeks  PLANNED INTERVENTIONS: 97110-Therapeutic exercises, 97530- Therapeutic activity, [id number]- Neuromuscular re-education, 97535- Self Care, [id number]- Manual therapy, and 97116- Gait training  PLAN FOR NEXT SESSION: will continue to progress strength and balance to advance his function and safety, update HEP next visit   Josette Rough, PT, DPT 08/07/23 3:15 PM

## 2023-08-13 ENCOUNTER — Ambulatory Visit: Payer: Medicare Other

## 2023-08-13 ENCOUNTER — Other Ambulatory Visit: Payer: Self-pay

## 2023-08-13 DIAGNOSIS — R2681 Unsteadiness on feet: Secondary | ICD-10-CM | POA: Diagnosis not present

## 2023-08-13 DIAGNOSIS — R296 Repeated falls: Secondary | ICD-10-CM

## 2023-08-13 NOTE — Therapy (Signed)
OUTPATIENT PHYSICAL THERAPY LOWER EXTREMITY TREATMENT    Patient Name: Tim Morgan MRN: [id number] DOB:[birth year], 89 y.o., male Today's Date: 08/13/2023   Progress Note     END OF SESSION:  PT End of Session - 08/13/23 0947     Visit Number 7    Date for PT Re-Evaluation 09/18/23    Authorization Type MCR    Progress Note Due on Visit 16    PT Start Time 0845    PT Stop Time 0930    PT Time Calculation (min) 45 min    Activity Tolerance Patient tolerated treatment well    Behavior During Therapy [facility] for tasks assessed/performed               Past Medical History:  Diagnosis Date   Acute bronchitis    COPD (chronic obstructive pulmonary disease) (HCC)    Diabetes mellitus    Edema of left lower extremity 07/03/2012   venous doppler-no evidence of thrombus or thrombophlebitis,left gsv valvular insufficency throughout the vein,left SSV appeared patent wiyh no venous insuffiency, left popliteal area questionable rupture Baker's cyst this was considered a abnormal doppler   GERD (gastroesophageal reflux disease)    History of back injury 07/01/1988   L 4 to , no surgery healed on own   Hypertension 12/06/2011   echo-EF 65-70%    Left ventricular hypertrophy    Melanoma (HCC)    Vitamin D deficiency    Past Surgical History:  Procedure Laterality Date   ANKLE SURGERY Left 07/01/1988   APPENDECTOMY  age 11   BRAIN SURGERY  07/01/1993   concusion   CATARACT EXTRACTION Right 04/03/2021   CATARACT EXTRACTION Left 05/01/2021   HERNIA REPAIR     x 2   KNEE ARTHROSCOPY Left 11/18/2012   Procedure: LEFT ARTHROSCOPY KNEE WITH DEBRIDEMENT AND CHRONDROLPLASTY;  Surgeon: Loanne Drilling, MD;  Location: [facility];  Service: Orthopedics;  Laterality: Left;   KNEE SURGERY     NASAL SINUS SURGERY Bilateral    to open up ear tubes   Patient Active Problem List   Diagnosis Date Noted   Viral URI with cough 05/27/2023   Neck pain 04/22/2023   Left hip pain 04/22/2023    Blurry vision, bilateral 04/22/2023   Acute pain of left knee 04/22/2023   H/O local excision of skin lesion on right lower leg 11/22/2022   Rash 11/22/2022   Vitamin D deficiency 11/22/2022   Cardiomyopathy (HCC) 11/22/2022   RBBB 11/22/2022   Acute cough 11/22/2022   Chest wall pain 11/22/2022   Class 1 obesity with serious comorbidity in adult 11/22/2022   Hyperlipidemia 11/22/2022   Acute pain of right shoulder 11/22/2022   Fall 11/22/2022   Preop general physical exam 11/22/2022   Venous stasis of both lower extremities 09/18/2022   Cellulitis of face 09/18/2022   Type 2 diabetes mellitus without complication, with long-term current use of insulin (HCC) 09/18/2022   Essential hypertension 09/10/2014   Upper airway cough syndrome 09/10/2014   Mild persistent asthma in adult without complication 08/17/2014   Chronic sinusitis/chronic cough  05/19/2014   Acute medial meniscal tear 11/18/2012    PCP: Fanny Bien, MD  REFERRING PROVIDER: same  REFERRING DIAG: recurrent falls, L hip and L knee pain  THERAPY DIAG:  Unsteady gait  Repeated falls  Rationale for Evaluation and Treatment: Rehabilitation  ONSET DATE: 10/2022  SUBJECTIVE:   SUBJECTIVE STATEMENT:  No changes since last time, my wife and I are both feeling  better from the flu    Reports 2 falls in the last year.  Most recently fell in his yard 3 weeks ago,when his wife fell on him and knocked him down.  Initially had much L hip and knee pain but now that pain is much improved.  Not noticing periods of staggering , does note that he uses chairs with rails on them , avoids chairs without arm rests. Has obtained st cane 2 weeks ago after his PCP convinced him he needed it.  PERTINENT HISTORY: [place]are[place] Seat x 2 this year, referred by PCP for gait training, fall prevention training and pain L hip and knee after recent fall PAIN:  Are you having pain? No 0/10 now   PRECAUTIONS: None  RED FLAGS: None   WEIGHT  BEARING RESTRICTIONS: No  FALLS:  Has patient fallen in last 6 months? Yes. Number of falls 2  LIVING ENVIRONMENT: Lives with: lives with their family and lives with their spouse Lives in: House/apartment Stairs: No Has following equipment at home: Single point cane  OCCUPATION: retired from career in Company secretary  PLOF: Independent  PATIENT GOALS: prevent falls, be able to assist my wife with her walking, et   NEXT MD VISIT: unknown  OBJECTIVE:  Note: Objective measures were completed at Evaluation unless otherwise noted.  DIAGNOSTIC FINDINGS: CT negative head MRI normal L knee and hip  COGNITION: Overall cognitive status: Within functional limits for tasks assessed     SENSATION: Not tested  EDEMA: Patient reports B lower leg edema, has begun using compression stockings  MUSCLE LENGTH: Hamstrings: Right wfl deg; Left wfl deg POSTURE: rounded shoulders and forward head  PALPATION: na  LOWER EXTREMITY ROM:all ROM screening wnl B LEs  LOWER EXTREMITY MMT: MMT wfl today Coordination testing LE's wnl  FUNCTIONAL TESTS:  30 seconds chair stand test  10; 08/07/23- 11.5 (11 and then stood up, 30 second timer went off before he could sit back down) BERG:  43/56; 08/07/23- 48/53  GAIT: Distance walked: in clinic up to 70' Assistive device utilized: Single point cane Level of assistance: Modified independence Comments: B foot slap   TODAY'S TREATMENT:                                                                                                                              DATE:  08/13/23: proprioceptive/ therapeutic activities:   In ll bars for toe raises on airex, 15 reps In ll bars for high slow marches, mass practice  Nustep L 5 Ue's and LE's x 8 min Standing in ll bars with lead foot on 8" step, narrow stance, small 2" lifts lead foot, mass practice Standing in ll bars lead foot on 8" step, wider base of support, turning yellow medibar like steering  wheel Unilateral standing in ll bar, 2 reps each leg , able to maintain up to 15 sec each Sit to stand from slightly elevated mat with red mediball, 2# forward punches  11 reps    08/07/23  Nustep L5x8 minutes for warm up prior to assessments Alternating toe taps on blue foam pad tapping top of orange cone  SLS with one foot on solid ground/one foot on BOSU 3x30 seconds B  Tandem stance on blue foam pad 1x30 seconds B    Objective measures for recert/progress note, education on progress towards all goals and POC moving forward. See above for objective measure specifics        07/21/23 Calf stretches On rockerboard 2 ways in pbars Nustep level 5 x 6 minutes Side stepping Side stepping with ball toss Walking ball toss Direction changes Ball kicks Cone toe touches with SPC and HHA, then on the airex Leg curls 20# x10, 25# x10 Leg extension 5# 2x10 Ball toss on airex  07/15/23 NuStep L4 x 6 minutes. Standing in stride in parallel bars, static, then with forward/back arm swing, then forward and back weight shifts, no UE support, 15 x each activity with each leg in front. He required min UE support with weight shifts due to balance, improved  with practice. Standing Heel tap/Toe tap with opposite leg 2 x 10 with each leg B side stepping on air ex plank, occasional UE support, x 4 each way Quick steps side to side and forward and back over Theraband placed on the floor. Increased speed with repetition, in parallel bars for safety, but used UE only 1-2 times Ambulation while flipping a ball from hand to hand, 170', S, no unsteadiness Standing in doorway sideways. Slide one foot forward and back x 10 each way, occasional UE support.  07/07/23 NuStep L4 x 6 minutes Mini squats elevated mat with feet on air ex, 11 reps B calf stretch on step B heel raises on step, BUE support, 10 reps Wall bumps increasing distance from wall, up to approx 12" Parallel bars, step back with rotation  and reach to touch wall behind, with control. Alternated sides x 10 each, very quick and stable. Ambulation with focus on upright posture and active trunk Ambulation in all directions upon therapist's command, occasioanlly mildly unsteady, but no LOB.  06/12/23 Nustep level 4 x 6 minutes Toe taps, heel raises  HEP and performed Education on what PT can do to help, spoke with his wife and he did agree to come 1x/week  05/07/23:  evaluation    PATIENT EDUCATION:  Education details: POC, goals Person educated: Patient Education method: Explanation, Demonstration, Tactile cues, Verbal cues, and Handouts Education comprehension: verbalized understanding  HOME EXERCISE PROGRAM: Access Code: [email or web address] URL: [email or web address] Date: 06/12/2023 Prepared by: Stacie Glaze  Exercises - Side Stepping with Counter Support  - 1 x daily - 7 x weekly - 1 sets - 10 reps - 3 hold - Standing Hip Abduction with Counter Support  - 1 x daily - 7 x weekly - 1 sets - 10 reps - 3 hold - Heel Toe Raises with Unilateral Counter Support  - 1 x daily - 7 x weekly - 1 sets - 10 reps - 3 hold - Standing March with Unilateral Counter Support  - 1 x daily - 7 x weekly - 1 sets - 10 reps - 3 hold - Standing Hip Extension with Unilateral Counter Support  - 1 x daily - 7 x weekly - 1 sets - 10 reps - 3 hold  ASSESSMENT:  CLINICAL IMPRESSION:  Pt returned today for skilled physical therapy to address his balance, safety, increased risk of falls.  He tolerated everything well,  challenged him today with balance activities with narrowed stance and single leg stance.  Also recommended trekking pole for him to use while working in his garden, and showed him the converting gardening/kneeling bench that we have here as a tool to make this task safer for him.     OBJECTIVE IMPAIRMENTS: decreased balance, decreased knowledge of condition, decreased knowledge of use of DME, and pain.   ACTIVITY  LIMITATIONS: squatting, transfers, and locomotion level  PARTICIPATION LIMITATIONS: community activity, yard work, and church  PERSONAL FACTORS: Age, Behavior pattern, Fitness, and 1-2 comorbidities: DM, COPD  are also affecting patient's functional outcome.   REHAB POTENTIAL: Good  CLINICAL DECISION MAKING: Stable/uncomplicated  EVALUATION COMPLEXITY: Low   GOALS: Goals reviewed with patient? Yes  SHORT TERM GOALS: Target date: 3 weeks 05/28/23 did not update STG as it was already met   1.  IHEP Goal status: issued 07/07/23-met   LONG TERM GOALS: Target date: 09/18/2023    Sharlene Motts score improve from 43/56 to 50/56  Baseline:  Goal status: ONGOING 08/27/23 48  2.  30 sec sit to stand greater than 12 Baseline: 10 Goal status: ONGOING 08/07/23- 11.5  3.  I and safe curb negotiation with st cane Baseline: not assessed yet Goal status: ONGOING 08/07/23- min guard, unsteady no device (not using SPC on his own so did without a device)   PLAN:  PT FREQUENCY: 2x/week  PT DURATION: 6 weeks  PLANNED INTERVENTIONS: 97110-Therapeutic exercises, 97530- Therapeutic activity, 97112- Neuromuscular re-education, 97535- Self Care, [id number]- Manual therapy, and 97116- Gait training  PLAN FOR NEXT SESSION: will continue to progress strength and balance to advance his function and safety, update HEP next visit   Barkley Kratochvil, PT, DPT, OCS 08/13/23 9:49 AM

## 2023-08-15 ENCOUNTER — Encounter: Payer: Self-pay | Admitting: Physical Therapy

## 2023-08-15 ENCOUNTER — Ambulatory Visit: Payer: Medicare Other | Admitting: Physical Therapy

## 2023-08-15 DIAGNOSIS — R2681 Unsteadiness on feet: Secondary | ICD-10-CM

## 2023-08-15 DIAGNOSIS — R296 Repeated falls: Secondary | ICD-10-CM

## 2023-08-15 NOTE — Therapy (Signed)
OUTPATIENT PHYSICAL THERAPY LOWER EXTREMITY TREATMENT    Patient Name: Tim Morgan MRN: [id number] DOB:03-May-1933, 89 y.o., male Today's Date: 08/15/2023      END OF SESSION:  PT End of Session - 08/15/23 0928     Visit Number 8    Date for PT Re-Evaluation 09/18/23    PT Start Time 0929    PT Stop Time 1015    PT Time Calculation (min) 46 min    Activity Tolerance Patient tolerated treatment well    Behavior During Therapy [facility] for tasks assessed/performed               Past Medical History:  Diagnosis Date   Acute bronchitis    COPD (chronic obstructive pulmonary disease) (HCC)    Diabetes mellitus    Edema of left lower extremity 07/03/2012   venous doppler-no evidence of thrombus or thrombophlebitis,left gsv valvular insufficency throughout the vein,left SSV appeared patent wiyh no venous insuffiency, left popliteal area questionable rupture Baker's cyst this was considered a abnormal doppler   GERD (gastroesophageal reflux disease)    History of back injury 07/01/1988   L 4 to , no surgery healed on own   Hypertension 12/06/2011   echo-EF 65-70%    Left ventricular hypertrophy    Melanoma (HCC)    Vitamin D deficiency    Past Surgical History:  Procedure Laterality Date   ANKLE SURGERY Left 07/01/1988   APPENDECTOMY  age 62   BRAIN SURGERY  07/01/1993   concusion   CATARACT EXTRACTION Right 04/03/2021   CATARACT EXTRACTION Left 05/01/2021   HERNIA REPAIR     x 2   KNEE ARTHROSCOPY Left 11/18/2012   Procedure: LEFT ARTHROSCOPY KNEE WITH DEBRIDEMENT AND CHRONDROLPLASTY;  Surgeon: Loanne Drilling, MD;  Location: [facility];  Service: Orthopedics;  Laterality: Left;   KNEE SURGERY     NASAL SINUS SURGERY Bilateral    to open up ear tubes   Patient Active Problem List   Diagnosis Date Noted   Viral URI with cough 05/27/2023   Neck pain 04/22/2023   Left hip pain 04/22/2023   Blurry vision, bilateral 04/22/2023   Acute pain of left knee 04/22/2023    H/O local excision of skin lesion on right lower leg 11/22/2022   Rash 11/22/2022   Vitamin D deficiency 11/22/2022   Cardiomyopathy (HCC) 11/22/2022   RBBB 11/22/2022   Acute cough 11/22/2022   Chest wall pain 11/22/2022   Class 1 obesity with serious comorbidity in adult 11/22/2022   Hyperlipidemia 11/22/2022   Acute pain of right shoulder 11/22/2022   Fall 11/22/2022   Preop general physical exam 11/22/2022   Venous stasis of both lower extremities 09/18/2022   Cellulitis of face 09/18/2022   Type 2 diabetes mellitus without complication, with long-term current use of insulin (HCC) 09/18/2022   Essential hypertension 09/10/2014   Upper airway cough syndrome 09/10/2014   Mild persistent asthma in adult without complication 08/17/2014   Chronic sinusitis/chronic cough  05/19/2014   Acute medial meniscal tear 11/18/2012    PCP: Fanny Bien, MD  REFERRING PROVIDER: same  REFERRING DIAG: recurrent falls, L hip and L knee pain  THERAPY DIAG:  Unsteady gait  Repeated falls  Rationale for Evaluation and Treatment: Rehabilitation  ONSET DATE: 10/2022  SUBJECTIVE:   SUBJECTIVE STATEMENT:  "L calf is sore, but I don't know"   Reports 2 falls in the last year.  Most recently fell in his yard 3 weeks ago,when his wife fell  on him and knocked him down.  Initially had much L hip and knee pain but now that pain is much improved.  Not noticing periods of staggering , does note that he uses chairs with rails on them , avoids chairs without arm rests. Has obtained st cane 2 weeks ago after his PCP convinced him he needed it.  PERTINENT HISTORY: Larey Seat x 2 this year, referred by PCP for gait training, fall prevention training and pain L hip and knee after recent fall PAIN:  Are you having pain? No 0/10 now   PRECAUTIONS: None  RED FLAGS: None   WEIGHT BEARING RESTRICTIONS: No  FALLS:  Has patient fallen in last 6 months? Yes. Number of falls 2  LIVING ENVIRONMENT: Lives  with: lives with their family and lives with their spouse Lives in: House/apartment Stairs: No Has following equipment at home: Single point cane  OCCUPATION: retired from career in Company secretary  PLOF: Independent  PATIENT GOALS: prevent falls, be able to assist my wife with her walking, et   NEXT MD VISIT: unknown  OBJECTIVE:  Note: Objective measures were completed at Evaluation unless otherwise noted.  DIAGNOSTIC FINDINGS: CT negative head MRI normal L knee and hip  COGNITION: Overall cognitive status: Within functional limits for tasks assessed     SENSATION: Not tested  EDEMA: Patient reports B lower leg edema, has begun using compression stockings  MUSCLE LENGTH: Hamstrings: Right wfl deg; Left wfl deg POSTURE: rounded shoulders and forward head  PALPATION: na  LOWER EXTREMITY ROM:all ROM screening wnl B LEs  LOWER EXTREMITY MMT: MMT wfl today Coordination testing LE's wnl  FUNCTIONAL TESTS:  30 seconds chair stand test  10; 08/07/23- 11.5 (11 and then stood up, 30 second timer went off before he could sit back down) BERG:  43/56; 08/07/23- 48/53  GAIT: Distance walked: in clinic up to 70' Assistive device utilized: Single point cane Level of assistance: Modified independence Comments: B foot slap   TODAY'S TREATMENT:                                                                                                                              DATE:  08/15/23 NuStep L 5 x 6 min HS curls 25lb 2x10 Leg Ext 5lb 2x10 S2S x10, then on airex x10 Alt 6in box taps 2x10 Ball toss on airex In // bars side steps on airex.   08/13/23: proprioceptive/ therapeutic activities:   In ll bars for toe raises on airex, 15 reps In ll bars for high slow marches, mass practice  Nustep L 5 Ue's and LE's x 8 min Standing in ll bars with lead foot on 8" step, narrow stance, small 2" lifts lead foot, mass practice Standing in ll bars lead foot on 8" step, wider base of support,  turning yellow medibar like steering wheel Unilateral standing in ll bar, 2 reps each leg , able to maintain up to 15 sec each Sit  to stand from slightly elevated mat with red mediball, 2# forward punches 11 reps    08/07/23  Nustep L5x8 minutes for warm up prior to assessments Alternating toe taps on blue foam pad tapping top of orange cone  SLS with one foot on solid ground/one foot on BOSU 3x30 seconds B  Tandem stance on blue foam pad 1x30 seconds B    Objective measures for recert/progress note, education on progress towards all goals and POC moving forward. See above for objective measure specifics     07/21/23 Calf stretches On rockerboard 2 ways in pbars Nustep level 5 x 6 minutes Side stepping Side stepping with ball toss Walking ball toss Direction changes Ball kicks Cone toe touches with SPC and HHA, then on the airex Leg curls 20# x10, 25# x10 Leg extension 5# 2x10 Ball toss on airex  07/15/23 NuStep L4 x 6 minutes. Standing in stride in parallel bars, static, then with forward/back arm swing, then forward and back weight shifts, no UE support, 15 x each activity with each leg in front. He required min UE support with weight shifts due to balance, improved  with practice. Standing Heel tap/Toe tap with opposite leg 2 x 10 with each leg B side stepping on air ex plank, occasional UE support, x 4 each way Quick steps side to side and forward and back over Theraband placed on the floor. Increased speed with repetition, in parallel bars for safety, but used UE only 1-2 times Ambulation while flipping a ball from hand to hand, 170', S, no unsteadiness Standing in doorway sideways. Slide one foot forward and back x 10 each way, occasional UE support.  07/07/23 NuStep L4 x 6 minutes Mini squats elevated mat with feet on air ex, 11 reps B calf stretch on step B heel raises on step, BUE support, 10 reps Wall bumps increasing distance from wall, up to approx 12" Parallel  bars, step back with rotation and reach to touch wall behind, with control. Alternated sides x 10 each, very quick and stable. Ambulation with focus on upright posture and active trunk Ambulation in all directions upon therapist's command, occasioanlly mildly unsteady, but no LOB.  06/12/23 Nustep level 4 x 6 minutes Toe taps, heel raises  HEP and performed Education on what PT can do to help, spoke with his wife and he did agree to come 1x/week  05/07/23:  evaluation    PATIENT EDUCATION:  Education details: POC, goals Person educated: Patient Education method: Explanation, Demonstration, Tactile cues, Verbal cues, and Handouts Education comprehension: verbalized understanding  HOME EXERCISE PROGRAM: Access Code: [email or web address] URL: [email or web address] Date: 06/12/2023 Prepared by: Stacie Glaze  Exercises - Side Stepping with Counter Support  - 1 x daily - 7 x weekly - 1 sets - 10 reps - 3 hold - Standing Hip Abduction with Counter Support  - 1 x daily - 7 x weekly - 1 sets - 10 reps - 3 hold - Heel Toe Raises with Unilateral Counter Support  - 1 x daily - 7 x weekly - 1 sets - 10 reps - 3 hold - Standing March with Unilateral Counter Support  - 1 x daily - 7 x weekly - 1 sets - 10 reps - 3 hold - Standing Hip Extension with Unilateral Counter Support  - 1 x daily - 7 x weekly - 1 sets - 10 reps - 3 hold  ASSESSMENT:  CLINICAL IMPRESSION:  Pt returned today for skilled physical therapy to address his balance, safety,  increased risk of falls.  He tolerated everything well, instability present on all non compliant surfaces. Cues needed for full ROM and eccentric control with leg curls and extensions. No reports of increase pain. Pt reports having a cane but left it in the car    OBJECTIVE IMPAIRMENTS: decreased balance, decreased knowledge of condition, decreased knowledge of use of DME, and pain.   ACTIVITY LIMITATIONS: squatting, transfers, and locomotion  level  PARTICIPATION LIMITATIONS: community activity, yard work, and church  PERSONAL FACTORS: Age, Behavior pattern, Fitness, and 1-2 comorbidities: DM, COPD  are also affecting patient's functional outcome.   REHAB POTENTIAL: Good  CLINICAL DECISION MAKING: Stable/uncomplicated  EVALUATION COMPLEXITY: Low   GOALS: Goals reviewed with patient? Yes  SHORT TERM GOALS: Target date: 3 weeks 05/28/23 did not update STG as it was already met   1.  IHEP Goal status: issued 07/07/23-met   LONG TERM GOALS: Target date: 09/18/2023    Sharlene Motts score improve from 43/56 to 50/56  Baseline:  Goal status: ONGOING 08/27/23 48  2.  30 sec sit to stand greater than 12 Baseline: 10 Goal status: ONGOING 08/07/23- 11.5  3.  I and safe curb negotiation with st cane Baseline: not assessed yet Goal status: ONGOING 08/07/23- min guard, unsteady no device (not using SPC on his own so did without a device)   PLAN:  PT FREQUENCY: 2x/week  PT DURATION: 6 weeks  PLANNED INTERVENTIONS: 97110-Therapeutic exercises, 97530- Therapeutic activity, [id number]- Neuromuscular re-education, 97535- Self Care, 16109- Manual therapy, and 97116- Gait training  PLAN FOR NEXT SESSION: will continue to progress strength and balance to advance his function and safety  Debroah Baller, PTA 08/15/23 9:28 AM

## 2023-08-20 ENCOUNTER — Ambulatory Visit: Payer: Medicare Other | Admitting: Physical Therapy

## 2023-08-22 ENCOUNTER — Ambulatory Visit: Payer: Medicare Other | Admitting: Physical Therapy

## 2023-08-22 ENCOUNTER — Encounter: Payer: Self-pay | Admitting: Physical Therapy

## 2023-08-22 DIAGNOSIS — R2681 Unsteadiness on feet: Secondary | ICD-10-CM

## 2023-08-22 DIAGNOSIS — R296 Repeated falls: Secondary | ICD-10-CM

## 2023-08-22 NOTE — Therapy (Signed)
OUTPATIENT PHYSICAL THERAPY LOWER EXTREMITY TREATMENT    Patient Name: Tim Morgan MRN: [id number] DOB:[birth year], 89 y.o., male Today's Date: 08/22/2023      END OF SESSION:  PT End of Session - 08/22/23 1005     Visit Number 9    Date for PT Re-Evaluation 09/18/23    PT Start Time 1005    PT Stop Time 1050    PT Time Calculation (min) 45 min    Activity Tolerance Patient tolerated treatment well    Behavior During Therapy [facility] for tasks assessed/performed               Past Medical History:  Diagnosis Date   Acute bronchitis    COPD (chronic obstructive pulmonary disease) (HCC)    Diabetes mellitus    Edema of left lower extremity 07/03/2012   venous doppler-no evidence of thrombus or thrombophlebitis,left gsv valvular insufficency throughout the vein,left SSV appeared patent wiyh no venous insuffiency, left popliteal area questionable rupture Baker's cyst this was considered a abnormal doppler   GERD (gastroesophageal reflux disease)    History of back injury 07/01/1988   L 4 to , no surgery healed on own   Hypertension 12/06/2011   echo-EF 65-70%    Left ventricular hypertrophy    Melanoma (HCC)    Vitamin D deficiency    Past Surgical History:  Procedure Laterality Date   ANKLE SURGERY Left 07/01/1988   APPENDECTOMY  age 68   BRAIN SURGERY  07/01/1993   concusion   CATARACT EXTRACTION Right 04/03/2021   CATARACT EXTRACTION Left 05/01/2021   HERNIA REPAIR     x 2   KNEE ARTHROSCOPY Left 11/18/2012   Procedure: LEFT ARTHROSCOPY KNEE WITH DEBRIDEMENT AND CHRONDROLPLASTY;  Surgeon: Loanne Drilling, MD;  Location: [facility];  Service: Orthopedics;  Laterality: Left;   KNEE SURGERY     NASAL SINUS SURGERY Bilateral    to open up ear tubes   Patient Active Problem List   Diagnosis Date Noted   Viral URI with cough 05/27/2023   Neck pain 04/22/2023   Left hip pain 04/22/2023   Blurry vision, bilateral 04/22/2023   Acute pain of left knee 04/22/2023    H/O local excision of skin lesion on right lower leg 11/22/2022   Rash 11/22/2022   Vitamin D deficiency 11/22/2022   Cardiomyopathy (HCC) 11/22/2022   RBBB 11/22/2022   Acute cough 11/22/2022   Chest wall pain 11/22/2022   Class 1 obesity with serious comorbidity in adult 11/22/2022   Hyperlipidemia 11/22/2022   Acute pain of right shoulder 11/22/2022   Fall 11/22/2022   Preop general physical exam 11/22/2022   Venous stasis of both lower extremities 09/18/2022   Cellulitis of face 09/18/2022   Type 2 diabetes mellitus without complication, with long-term current use of insulin (HCC) 09/18/2022   Essential hypertension 09/10/2014   Upper airway cough syndrome 09/10/2014   Mild persistent asthma in adult without complication 08/17/2014   Chronic sinusitis/chronic cough  05/19/2014   Acute medial meniscal tear 11/18/2012    PCP: Fanny Bien, MD  REFERRING PROVIDER: same  REFERRING DIAG: recurrent falls, L hip and L knee pain  THERAPY DIAG:  Unsteady gait  Repeated falls  Rationale for Evaluation and Treatment: Rehabilitation  ONSET DATE: 10/2022  SUBJECTIVE:   SUBJECTIVE STATEMENT:  L leg has been sore since last time, sore from the back of is L knee into the calf   Reports 2 falls in the last year.  Most  recently fell in his yard 3 weeks ago,when his wife fell on him and knocked him down.  Initially had much L hip and knee pain but now that pain is much improved.  Not noticing periods of staggering , does note that he uses chairs with rails on them , avoids chairs without arm rests. Has obtained st cane 2 weeks ago after his PCP convinced him he needed it.  PERTINENT HISTORY: Larey Seat x 2 this year, referred by PCP for gait training, fall prevention training and pain L hip and knee after recent fall PAIN:  Are you having pain? 3/10 now   PRECAUTIONS: None  RED FLAGS: None   WEIGHT BEARING RESTRICTIONS: No  FALLS:  Has patient fallen in last 6 months? Yes.  Number of falls 2  LIVING ENVIRONMENT: Lives with: lives with their family and lives with their spouse Lives in: House/apartment Stairs: No Has following equipment at home: Single point cane  OCCUPATION: retired from career in Company secretary  PLOF: Independent  PATIENT GOALS: prevent falls, be able to assist my wife with her walking, et   NEXT MD VISIT: unknown  OBJECTIVE:  Note: Objective measures were completed at Evaluation unless otherwise noted.  DIAGNOSTIC FINDINGS: CT negative head MRI normal L knee and hip  COGNITION: Overall cognitive status: Within functional limits for tasks assessed     SENSATION: Not tested  EDEMA: Patient reports B lower leg edema, has begun using compression stockings  MUSCLE LENGTH: Hamstrings: Right wfl deg; Left wfl deg POSTURE: rounded shoulders and forward head  PALPATION: na  LOWER EXTREMITY ROM:all ROM screening wnl B LEs  LOWER EXTREMITY MMT: MMT wfl today Coordination testing LE's wnl  FUNCTIONAL TESTS:  30 seconds chair stand test  10; 08/07/23- 11.5 (11 and then stood up, 30 second timer went off before he could sit back down) BERG:  43/56; 08/07/23- 48/53  GAIT: Distance walked: in clinic up to 70' Assistive device utilized: Single point cane Level of assistance: Modified independence Comments: B foot slap   TODAY'S TREATMENT:                                                                                                                              DATE:  08/22/23 NuStep L 5 x 7 min Slant board Calf stretch 20lb resisted gait 4 ay x 3 each S2S on airex 2x10 LE PROM Stretching to HS, piriformis, K2C Seated self HS stretch Shoulder Ext 5lb 2x10   08/15/23 NuStep L 5 x 6 min HS curls 25lb 2x10 Leg Ext 5lb 2x10 S2S x10, then on airex x10 Alt 6in box taps 2x10 Ball toss on airex In // bars side steps on airex.   08/13/23: proprioceptive/ therapeutic activities:   In ll bars for toe raises on airex, 15  reps In ll bars for high slow marches, mass practice  Nustep L 5 Ue's and LE's x 8 min Standing in ll bars with lead foot  on 8" step, narrow stance, small 2" lifts lead foot, mass practice Standing in ll bars lead foot on 8" step, wider base of support, turning yellow medibar like steering wheel Unilateral standing in ll bar, 2 reps each leg , able to maintain up to 15 sec each Sit to stand from slightly elevated mat with red mediball, 2# forward punches 11 reps    08/07/23  Nustep L5x8 minutes for warm up prior to assessments Alternating toe taps on blue foam pad tapping top of orange cone  SLS with one foot on solid ground/one foot on BOSU 3x30 seconds B  Tandem stance on blue foam pad 1x30 seconds B    Objective measures for recert/progress note, education on progress towards all goals and POC moving forward. See above for objective measure specifics    07/21/23 Calf stretches On rockerboard 2 ways in pbars Nustep level 5 x 6 minutes Side stepping Side stepping with ball toss Walking ball toss Direction changes Ball kicks Cone toe touches with SPC and HHA, then on the airex Leg curls 20# x10, 25# x10 Leg extension 5# 2x10 Ball toss on airex  07/15/23 NuStep L4 x 6 minutes. Standing in stride in parallel bars, static, then with forward/back arm swing, then forward and back weight shifts, no UE support, 15 x each activity with each leg in front. He required min UE support with weight shifts due to balance, improved  with practice. Standing Heel tap/Toe tap with opposite leg 2 x 10 with each leg B side stepping on air ex plank, occasional UE support, x 4 each way Quick steps side to side and forward and back over Theraband placed on the floor. Increased speed with repetition, in parallel bars for safety, but used UE only 1-2 times Ambulation while flipping a ball from hand to hand, 170', S, no unsteadiness Standing in doorway sideways. Slide one foot forward and back x 10 each  way, occasional UE support.  07/07/23 NuStep L4 x 6 minutes Mini squats elevated mat with feet on air ex, 11 reps B calf stretch on step B heel raises on step, BUE support, 10 reps Wall bumps increasing distance from wall, up to approx 12" Parallel bars, step back with rotation and reach to touch wall behind, with control. Alternated sides x 10 each, very quick and stable. Ambulation with focus on upright posture and active trunk Ambulation in all directions upon therapist's command, occasioanlly mildly unsteady, but no LOB.  06/12/23 Nustep level 4 x 6 minutes Toe taps, heel raises  HEP and performed Education on what PT can do to help, spoke with his wife and he did agree to come 1x/week  05/07/23:  evaluation    PATIENT EDUCATION:  Education details: POC, goals Person educated: Patient Education method: Explanation, Demonstration, Tactile cues, Verbal cues, and Handouts Education comprehension: verbalized understanding  HOME EXERCISE PROGRAM: Access Code: [email or web address] URL: [email or web address] Date: 06/12/2023 Prepared by: Stacie Glaze  Exercises - Side Stepping with Counter Support  - 1 x daily - 7 x weekly - 1 sets - 10 reps - 3 hold - Standing Hip Abduction with Counter Support  - 1 x daily - 7 x weekly - 1 sets - 10 reps - 3 hold - Heel Toe Raises with Unilateral Counter Support  - 1 x daily - 7 x weekly - 1 sets - 10 reps - 3 hold - Standing March with Unilateral Counter Support  - 1 x daily - 7 x weekly - 1 sets -  10 reps - 3 hold - Standing Hip Extension with Unilateral Counter Support  - 1 x daily - 7 x weekly - 1 sets - 10 reps - 3 hold  ASSESSMENT:  CLINICAL IMPRESSION:  Pt returned today for skilled physical therapy to address his balance, safety, increased risk of falls. He enters reporting some posterior RLE soreness that was aided after aerobic warm up and stretching. Cues for control and needed with sit to stands. Pt tends to drag LE's despite  cues during resisted gait, will correct at times. No reports of increase pain.     OBJECTIVE IMPAIRMENTS: decreased balance, decreased knowledge of condition, decreased knowledge of use of DME, and pain.   ACTIVITY LIMITATIONS: squatting, transfers, and locomotion level  PARTICIPATION LIMITATIONS: community activity, yard work, and church  PERSONAL FACTORS: Age, Behavior pattern, Fitness, and 1-2 comorbidities: DM, COPD  are also affecting patient's functional outcome.   REHAB POTENTIAL: Good  CLINICAL DECISION MAKING: Stable/uncomplicated  EVALUATION COMPLEXITY: Low   GOALS: Goals reviewed with patient? Yes  SHORT TERM GOALS: Target date: 3 weeks 05/28/23 did not update STG as it was already met   1.  IHEP Goal status: issued 07/07/23-met   LONG TERM GOALS: Target date: 09/18/2023    Sharlene Motts score improve from 43/56 to 50/56  Baseline:  Goal status: ONGOING 08/27/23 48  2.  30 sec sit to stand greater than 12 Baseline: 10 Goal status: ONGOING 08/07/23- 11.5  3.  I and safe curb negotiation with st cane Baseline: not assessed yet Goal status: ONGOING 08/07/23- min guard, unsteady no device (not using SPC on his own so did without a device)   PLAN:  PT FREQUENCY: 2x/week  PT DURATION: 6 weeks  PLANNED INTERVENTIONS: 97110-Therapeutic exercises, 97530- Therapeutic activity, [id number]- Neuromuscular re-education, 97535- Self Care, 96045- Manual therapy, and 97116- Gait training  PLAN FOR NEXT SESSION: will continue to progress strength and balance to advance his function and safety  Debroah Baller, PTA 08/22/23 10:08 AM

## 2023-08-25 ENCOUNTER — Encounter: Payer: Self-pay | Admitting: Physical Therapy

## 2023-08-25 ENCOUNTER — Ambulatory Visit: Payer: Medicare Other | Admitting: Physical Therapy

## 2023-08-25 DIAGNOSIS — R2681 Unsteadiness on feet: Secondary | ICD-10-CM | POA: Diagnosis not present

## 2023-08-25 DIAGNOSIS — R296 Repeated falls: Secondary | ICD-10-CM

## 2023-08-25 NOTE — Therapy (Signed)
 OUTPATIENT PHYSICAL THERAPY LOWER EXTREMITY TREATMENT    Patient Name: Tim Morgan MRN: [id number] DOB:1932/07/26, 89 y.o., male Today's Date: 08/25/2023        END OF SESSION:  PT End of Session - 08/25/23 1303     Visit Number 10    Date for PT Re-Evaluation 09/18/23    Authorization Type MCR    Progress Note Due on Visit 16    PT Start Time 1301    PT Stop Time 1341    PT Time Calculation (min) 40 min    Activity Tolerance Patient tolerated treatment well    Behavior During Therapy WFL for tasks assessed/performed                Past Medical History:  Diagnosis Date   Acute bronchitis    COPD (chronic obstructive pulmonary disease) (HCC)    Diabetes mellitus    Edema of left lower extremity 07/03/2012   venous doppler-no evidence of thrombus or thrombophlebitis,left gsv valvular insufficency throughout the vein,left SSV appeared patent wiyh no venous insuffiency, left popliteal area questionable rupture Baker's cyst this was considered a abnormal doppler   GERD (gastroesophageal reflux disease)    History of back injury 07/01/1988   L 4 to , no surgery healed on own   Hypertension 12/06/2011   echo-EF 65-70%    Left ventricular hypertrophy    Melanoma (HCC)    Vitamin D deficiency    Past Surgical History:  Procedure Laterality Date   ANKLE SURGERY Left 07/01/1988   APPENDECTOMY  age 84   BRAIN SURGERY  07/01/1993   concusion   CATARACT EXTRACTION Right 04/03/2021   CATARACT EXTRACTION Left 05/01/2021   HERNIA REPAIR     x 2   KNEE ARTHROSCOPY Left 11/18/2012   Procedure: LEFT ARTHROSCOPY KNEE WITH DEBRIDEMENT AND CHRONDROLPLASTY;  Surgeon: Loanne Drilling, MD;  Location: [facility];  Service: Orthopedics;  Laterality: Left;   KNEE SURGERY     NASAL SINUS SURGERY Bilateral    to open up ear tubes   Patient Active Problem List   Diagnosis Date Noted   Viral URI with cough 05/27/2023   Neck pain 04/22/2023   Left hip pain 04/22/2023   Blurry  vision, bilateral 04/22/2023   Acute pain of left knee 04/22/2023   H/O local excision of skin lesion on right lower leg 11/22/2022   Rash 11/22/2022   Vitamin D deficiency 11/22/2022   Cardiomyopathy (HCC) 11/22/2022   RBBB 11/22/2022   Acute cough 11/22/2022   Chest wall pain 11/22/2022   Class 1 obesity with serious comorbidity in adult 11/22/2022   Hyperlipidemia 11/22/2022   Acute pain of right shoulder 11/22/2022   Fall 11/22/2022   Preop general physical exam 11/22/2022   Venous stasis of both lower extremities 09/18/2022   Cellulitis of face 09/18/2022   Type 2 diabetes mellitus without complication, with long-term current use of insulin (HCC) 09/18/2022   Essential hypertension 09/10/2014   Upper airway cough syndrome 09/10/2014   Mild persistent asthma in adult without complication 08/17/2014   Chronic sinusitis/chronic cough  05/19/2014   Acute medial meniscal tear 11/18/2012    PCP: Fanny Bien, MD  REFERRING PROVIDER: same  REFERRING DIAG: recurrent falls, L hip and L knee pain  THERAPY DIAG:  Unsteady gait  Repeated falls  Rationale for Evaluation and Treatment: Rehabilitation  ONSET DATE: 10/2022  SUBJECTIVE:   SUBJECTIVE STATEMENT:   Legs are sore today, feeling good otherwise. It started since last  time I was here. I haven't been doing much at home, we have too much going on.    EVAL: Reports 2 falls in the last year.  Most recently fell in his yard 3 weeks ago,when his wife fell on him and knocked him down.  Initially had much L hip and knee pain but now that pain is much improved.  Not noticing periods of staggering , does note that he uses chairs with rails on them , avoids chairs without arm rests. Has obtained st cane 2 weeks ago after his PCP convinced him he needed it.  PERTINENT HISTORY: Larey Seat x 2 this year, referred by PCP for gait training, fall prevention training and pain L hip and knee after recent fall PAIN:  Are you having pain?  Yes: NPRS scale: 3-4/10 Pain location: back of L LE  Pain description: muscle soreness  Aggravating factors: unsure  Relieving factors: unsure     PRECAUTIONS: None  RED FLAGS: None   WEIGHT BEARING RESTRICTIONS: No  FALLS:  Has patient fallen in last 6 months? Yes. Number of falls 2  LIVING ENVIRONMENT: Lives with: lives with their family and lives with their spouse Lives in: House/apartment Stairs: No Has following equipment at home: Single point cane  OCCUPATION: retired from career in Company secretary  PLOF: Independent  PATIENT GOALS: prevent falls, be able to assist my wife with her walking, et   NEXT MD VISIT: unknown  OBJECTIVE:  Note: Objective measures were completed at Evaluation unless otherwise noted.  DIAGNOSTIC FINDINGS: CT negative head MRI normal L knee and hip  COGNITION: Overall cognitive status: Within functional limits for tasks assessed     SENSATION: Not tested  EDEMA: Patient reports B lower leg edema, has begun using compression stockings  MUSCLE LENGTH: Hamstrings: Right wfl deg; Left wfl deg POSTURE: rounded shoulders and forward head  PALPATION: na  LOWER EXTREMITY ROM:all ROM screening wnl B LEs  LOWER EXTREMITY MMT: MMT wfl today Coordination testing LE's wnl  FUNCTIONAL TESTS:  30 seconds chair stand test  10; 08/07/23- 11.5 (11 and then stood up, 30 second timer went off before he could sit back down) BERG:  43/56; 08/07/23- 48/53  GAIT: Distance walked: in clinic up to 70' Assistive device utilized: Single point cane Level of assistance: Modified independence Comments: B foot slap   TODAY'S TREATMENT:                                                                                                                              DATE:   08/25/23  Nustep L5x8 minutes all four extremities for w/u, tissue perfusion, neural priming   HS stretches 2x30 seconds B  Calf stretches 2x30 seconds B Forward step ups 6 inch step  minimal hands x10 each min guard for strength  Tandem stance blue foam pad 3x30 seconds B  Tandem gait one finger on each side x5 laps blue foam  Side stepping along blue  foam x5 laps      08/13/23: proprioceptive/ therapeutic activities:   In ll bars for toe raises on airex, 15 reps In ll bars for high slow marches, mass practice  Nustep L 5 Ue's and LE's x 8 min Standing in ll bars with lead foot on 8" step, narrow stance, small 2" lifts lead foot, mass practice Standing in ll bars lead foot on 8" step, wider base of support, turning yellow medibar like steering wheel Unilateral standing in ll bar, 2 reps each leg , able to maintain up to 15 sec each Sit to stand from slightly elevated mat with red mediball, 2# forward punches 11 reps    08/07/23  Nustep L5x8 minutes for warm up prior to assessments Alternating toe taps on blue foam pad tapping top of orange cone  SLS with one foot on solid ground/one foot on BOSU 3x30 seconds B  Tandem stance on blue foam pad 1x30 seconds B    Objective measures for recert/progress note, education on progress towards all goals and POC moving forward. See above for objective measure specifics        07/21/23 Calf stretches On rockerboard 2 ways in pbars Nustep level 5 x 6 minutes Side stepping Side stepping with ball toss Walking ball toss Direction changes Ball kicks Cone toe touches with SPC and HHA, then on the airex Leg curls 20# x10, 25# x10 Leg extension 5# 2x10 Ball toss on airex  07/15/23 NuStep L4 x 6 minutes. Standing in stride in parallel bars, static, then with forward/back arm swing, then forward and back weight shifts, no UE support, 15 x each activity with each leg in front. He required min UE support with weight shifts due to balance, improved  with practice. Standing Heel tap/Toe tap with opposite leg 2 x 10 with each leg B side stepping on air ex plank, occasional UE support, x 4 each way Quick steps side to  side and forward and back over Theraband placed on the floor. Increased speed with repetition, in parallel bars for safety, but used UE only 1-2 times Ambulation while flipping a ball from hand to hand, 170', S, no unsteadiness Standing in doorway sideways. Slide one foot forward and back x 10 each way, occasional UE support.  07/07/23 NuStep L4 x 6 minutes Mini squats elevated mat with feet on air ex, 11 reps B calf stretch on step B heel raises on step, BUE support, 10 reps Wall bumps increasing distance from wall, up to approx 12" Parallel bars, step back with rotation and reach to touch wall behind, with control. Alternated sides x 10 each, very quick and stable. Ambulation with focus on upright posture and active trunk Ambulation in all directions upon therapist's command, occasioanlly mildly unsteady, but no LOB.  06/12/23 Nustep level 4 x 6 minutes Toe taps, heel raises  HEP and performed Education on what PT can do to help, spoke with his wife and he did agree to come 1x/week  05/07/23:  evaluation    PATIENT EDUCATION:  Education details: POC, goals Person educated: Patient Education method: Explanation, Demonstration, Tactile cues, Verbal cues, and Handouts Education comprehension: verbalized understanding  HOME EXERCISE PROGRAM: Access Code: [email or web address] URL: [email or web address] Date: 06/12/2023 Prepared by: Stacie Glaze  Exercises - Side Stepping with Counter Support  - 1 x daily - 7 x weekly - 1 sets - 10 reps - 3 hold - Standing Hip Abduction with Counter Support  - 1 x daily - 7 x weekly -  1 sets - 10 reps - 3 hold - Heel Toe Raises with Unilateral Counter Support  - 1 x daily - 7 x weekly - 1 sets - 10 reps - 3 hold - Standing March with Unilateral Counter Support  - 1 x daily - 7 x weekly - 1 sets - 10 reps - 3 hold - Standing Hip Extension with Unilateral Counter Support  - 1 x daily - 7 x weekly - 1 sets - 10 reps - 3  hold  ASSESSMENT:  CLINICAL IMPRESSION:  Pt arrives today doing OK, main complaint was LE soreness after last PT workout. Worked in some stretching for calf and hamstring today to address this pain, otherwise switched focus and worked more on balance today. Intended to update HEP but he has not been compliant with this recently, so delayed updates. Still having some posterior LOB with STS efforts.      OBJECTIVE IMPAIRMENTS: decreased balance, decreased knowledge of condition, decreased knowledge of use of DME, and pain.   ACTIVITY LIMITATIONS: squatting, transfers, and locomotion level  PARTICIPATION LIMITATIONS: community activity, yard work, and church  PERSONAL FACTORS: Age, Behavior pattern, Fitness, and 1-2 comorbidities: DM, COPD  are also affecting patient's functional outcome.   REHAB POTENTIAL: Good  CLINICAL DECISION MAKING: Stable/uncomplicated  EVALUATION COMPLEXITY: Low   GOALS: Goals reviewed with patient? Yes  SHORT TERM GOALS: Target date: 3 weeks 05/28/23 did not update STG as it was already met   1.  IHEP Goal status: issued 07/07/23-met   LONG TERM GOALS: Target date: 09/18/2023    Sharlene Motts score improve from 43/56 to 50/56  Baseline:  Goal status: ONGOING 08/27/23 48  2.  30 sec sit to stand greater than 12 Baseline: 10 Goal status: ONGOING 08/07/23- 11.5  3.  I and safe curb negotiation with st cane Baseline: not assessed yet Goal status: ONGOING 08/07/23- min guard, unsteady no device (not using SPC on his own so did without a device)   PLAN:  PT FREQUENCY: 2x/week  PT DURATION: 6 weeks  PLANNED INTERVENTIONS: 97110-Therapeutic exercises, 97530- Therapeutic activity, [id number]- Neuromuscular re-education, 97535- Self Care, 16109- Manual therapy, and 97116- Gait training  PLAN FOR NEXT SESSION: will continue to progress strength and balance to advance his function and safety, encourage HEP compliance   Nedra Hai, PT, DPT 08/25/23 1:42  PM

## 2023-08-26 ENCOUNTER — Encounter: Payer: Self-pay | Admitting: Family Medicine

## 2023-08-26 ENCOUNTER — Ambulatory Visit (INDEPENDENT_AMBULATORY_CARE_PROVIDER_SITE_OTHER): Payer: Medicare Other | Admitting: Family Medicine

## 2023-08-26 VITALS — BP 128/76 | HR 74 | Temp 97.6°F | Wt 201.0 lb

## 2023-08-26 DIAGNOSIS — M25462 Effusion, left knee: Secondary | ICD-10-CM

## 2023-08-26 DIAGNOSIS — R2689 Other abnormalities of gait and mobility: Secondary | ICD-10-CM

## 2023-08-26 DIAGNOSIS — I10 Essential (primary) hypertension: Secondary | ICD-10-CM | POA: Diagnosis not present

## 2023-08-26 DIAGNOSIS — G4762 Sleep related leg cramps: Secondary | ICD-10-CM | POA: Insufficient documentation

## 2023-08-26 DIAGNOSIS — I878 Other specified disorders of veins: Secondary | ICD-10-CM | POA: Diagnosis not present

## 2023-08-26 MED ORDER — BACLOFEN 10 MG PO TABS
10.0000 mg | ORAL_TABLET | Freq: Two times a day (BID) | ORAL | 3 refills | Status: DC | PRN
Start: 2023-08-26 — End: 2024-02-06

## 2023-08-26 NOTE — Progress Notes (Signed)
 Assessment/Plan:      Nocturnal Leg Cramps Chronic muscle spasms primarily affecting the legs, occurring mostly at night. Baclofen has been effective for 5-10 years with no significant side effects. Increased cramps noted after a week without baclofen. Prefers to continue baclofen over switching to Robaxin. - Refill baclofen 10 mg, twice a day as needed  Knee Effusion Recurrent left knee effusion with pain and swelling, managed with intermittent knee joint aspiration. Prefers to continue management with current orthopedist. -Follow-up with orthopedics as needed  Balance Issues Chronic balance issues, improved with physical therapy focusing on leg strength and balance. Attends sessions twice a week. - Continue physical therapy sessions twice a week  Venous Insufficiency Chronic venous insufficiency in the legs, managed with compression stockings and furosemide 40 mg daily. Reports dry skin and irritation. Discussed use of emollients and importance of hydration. - Apply emollients like CeraVe or Eucerin as needed - Consider topical steroids for local inflammation if necessary - Encourage adequate hydration - Continue compression stockings  Hypertension Hypertension well-controlled with losartan 50 mg. Blood pressure today is 128/76 mmHg. Monitors blood pressure at home with occasional fluctuations. Discussed importance of regular monitoring and maintaining current medication regimen. - Continue losartan 50 mg daily - Monitor blood pressure regularly  General Health Maintenance Due for routine lab work. Coordination with endocrinologist for diabetes management to avoid redundancy in lab work. - Coordinate with endocrinologist for upcoming lab work - Plan for follow-up visit in six months for wellness check and blood pressure monitoring  Follow-up - Schedule follow-up visit in six months - Ensure lab work is coordinated with endocrinologist.       Medications Discontinued During  This Encounter  Medication Reason   bacitracin ointment    beclomethasone (QVAR) 80 MCG/ACT inhaler    Biotin 5000 MCG CAPS    methocarbamol (ROBAXIN) 500 MG tablet    terbinafine (LAMISIL) 250 MG tablet    triamcinolone cream (KENALOG) 0.1 %    hydrOXYzine (ATARAX/VISTARIL) 25 MG tablet    esomeprazole (NEXIUM) 40 MG capsule    budesonide-formoterol (SYMBICORT) 160-4.5 MCG/ACT inhaler    calcium citrate-vitamin D (CITRACAL+D) 315-200 MG-UNIT per tablet    clobetasol cream (TEMOVATE) 0.05 %    Multiple Vitamin (MULTIVITAMIN WITH MINERALS) TABS    Potassium 99 MG TABS    Respiratory Therapy Supplies (FLUTTER) DEVI    baclofen (LIORESAL) 10 MG tablet Reorder    Return in about 6 months (around 02/23/2024) for BP.    Subjective:   Encounter date: 08/26/2023  Tim Morgan is a 89 y.o. male who has Acute medial meniscal tear; Chronic sinusitis/chronic cough ; Mild persistent asthma in adult without complication; Essential hypertension; Upper airway cough syndrome; Venous stasis of both lower extremities; Cellulitis of face; Type 2 diabetes mellitus without complication, with long-term current use of insulin (HCC); H/O local excision of skin lesion on right lower leg; Rash; Vitamin D deficiency; Cardiomyopathy (HCC); RBBB; Acute cough; Chest wall pain; Class 1 obesity with serious comorbidity in adult; Hyperlipidemia; Acute pain of right shoulder; Fall; Preop general physical exam; Neck pain; Left hip pain; Blurry vision, bilateral; Acute pain of left knee; Viral URI with cough; and Nocturnal leg cramps on their problem list..   He  has a past medical history of Acute bronchitis, COPD (chronic obstructive pulmonary disease) (HCC), Diabetes mellitus, Edema of left lower extremity (07/03/2012), GERD (gastroesophageal reflux disease), History of back injury (07/01/1988), Hypertension (12/06/2011), Left ventricular hypertrophy, Melanoma (HCC), and Vitamin D deficiency.Marland Kitchen  He presents with chief  complaint of Medical Management of Chronic Issues (Discuss baclofen refill) .  Discussed the use of AI scribe software for clinical note transcription with the patient, who gave verbal consent to proceed.  History of Present Illness   Tim Morgan is an 89 year old male who presents with a baclofen refill and follow-up on leg cramps.  He has been taking baclofen for leg cramps for approximately five to ten years, at a dose of 10 mg twice a day as needed. He ran out of baclofen about a week and a half ago and continues to experience leg cramps, primarily at night. The medication does not completely alleviate the cramps, but he reports no side effects such as dizziness or increased tiredness.  He has a history of fluid accumulation in his knee, for which he has had fluid drawn multiple times. Knee pain is associated with fluid buildup, and he wears compression stockings to manage this condition. He mentions a history of a fall in November, resulting in a shoulder injury, and he has been seeing orthopedics for a left femur fracture.  He reports a history of balance problems, which he attributes to factors other than dizziness. He is currently attending physical therapy twice a week, primarily for his legs, focusing on stretching his calves and hamstrings to improve balance. He notes some improvement in mobility in his left calf due to physical therapy.  He is currently on losartan 50 mg for blood pressure management and Lasix 40 mg, which he notes can cause dry skin if not adequately hydrated. He monitors his blood pressure regularly, noting some variability but generally stable readings.  He has chronic venous insufficiency with associated leg swelling and dry skin. He wears compression stockings and reports dry skin under the stockings. He also has a history of diabetes, for which he sees an endocrinologist.  No chest pain, shortness of breath, or worsening of cramps. He has a history of numbness  in his left leg for about six months, which he associates with fluid accumulation in his knee.        Past Surgical History:  Procedure Laterality Date   ANKLE SURGERY Left 07/01/1988   APPENDECTOMY  age 54   BRAIN SURGERY  07/01/1993   concusion   CATARACT EXTRACTION Right 04/03/2021   CATARACT EXTRACTION Left 05/01/2021   HERNIA REPAIR     x 2   KNEE ARTHROSCOPY Left 11/18/2012   Procedure: LEFT ARTHROSCOPY KNEE WITH DEBRIDEMENT AND CHRONDROLPLASTY;  Surgeon: Loanne Drilling, MD;  Location: [facility];  Service: Orthopedics;  Laterality: Left;   KNEE SURGERY     NASAL SINUS SURGERY Bilateral    to open up ear tubes    Outpatient Medications Prior to Visit  Medication Sig Dispense Refill   acetaminophen (TYLENOL) 650 MG CR tablet Take 650 mg by mouth every 8 (eight) hours as needed for pain.     Chromium-Cinnamon (CINNAMON PLUS CHROMIUM PO) Take 2-3 capsules by mouth 2 (two) times daily.     ciclopirox (PENLAC) 8 % solution      Coenzyme Q10 (COQ10) 100 MG CAPS Take 1 capsule by mouth daily.     cyanocobalamin (VITAMIN B12) 1000 MCG tablet Take 1,000 mcg by mouth daily.     docusate sodium (COLACE) 100 MG capsule Take 100 mg by mouth at bedtime.     Ergocalciferol 50 MCG (2000 UT) CAPS Take 50 mcg by mouth once a week.     famotidine (  PEPCID) 20 MG tablet Take 20 mg by mouth at bedtime. Reported on 10/18/2015  2   ferrous sulfate 325 (65 FE) MG tablet Take 1 tablet by mouth daily.     furosemide (LASIX) 40 MG tablet Take 1 tablet (40 mg total) by mouth daily. 90 tablet 3   insulin aspart (NOVOLOG) 100 UNIT/ML FlexPen Inject 6 Units into the skin 3 (three) times daily with meals.     insulin glargine (LANTUS) 100 UNIT/ML injection Inject 18 Units into the skin at bedtime.     losartan (COZAAR) 50 MG tablet Take 1 tab po daily 90 tablet 1   magnesium oxide (MAG-OX) 400 MG tablet Take 400 mg by mouth daily.     metFORMIN (GLUMETZA) 500 MG (MOD) 24 hr tablet Take 500 mg by mouth 2 (two)  times daily with a meal.     Multiple Vitamins-Minerals (PRESERVISION AREDS) TABS Take 1 tablet by mouth daily.     omeprazole (PRILOSEC) 20 MG capsule Take 1 capsule (20 mg total) by mouth daily. 90 capsule 1   rosuvastatin (CRESTOR) 40 MG tablet Take 1 tablet (40 mg total) by mouth at bedtime. 90 tablet 1   sildenafil (VIAGRA) 100 MG tablet Take 100 mg by mouth daily as needed for erectile dysfunction.     tamsulosin (FLOMAX) 0.4 MG CAPS capsule Take 1 capsule (0.4 mg total) by mouth daily. 90 capsule 1   TRULICITY 0.75 MG/0.5ML SOPN      Zinc 50 MG TABS Take by mouth.     baclofen (LIORESAL) 10 MG tablet Take 10 mg by mouth 2 (two) times daily.     Ascorbic Acid (VITAMIN C) 1000 MG tablet Take 1,000 mg by mouth.     fluticasone (FLONASE) 50 MCG/ACT nasal spray Place 2 sprays into both nostrils daily. 16 g 0   ipratropium-albuterol (DUONEB) 0.5-2.5 (3) MG/3ML SOLN Reported on 10/18/2015 (Patient not taking: Reported on 08/26/2023)  3   bacitracin ointment Apply 1 Application topically 2 (two) times daily. (Patient not taking: Reported on 08/26/2023) 120 g 0   beclomethasone (QVAR) 80 MCG/ACT inhaler Inhale 2 puffs into the lungs 2 (two) times daily. (Patient not taking: Reported on 08/26/2023)     Biotin 5000 MCG CAPS Take 1 capsule by mouth daily. (Patient not taking: Reported on 08/26/2023)     budesonide-formoterol (SYMBICORT) 160-4.5 MCG/ACT inhaler Inhale 2 puffs into the lungs 2 (two) times daily. (Patient not taking: Reported on 08/26/2023)     calcium citrate-vitamin D (CITRACAL+D) 315-200 MG-UNIT per tablet Take 1 tablet by mouth 2 (two) times daily. (Patient not taking: Reported on 04/09/2023)     clobetasol cream (TEMOVATE) 0.05 %  (Patient not taking: Reported on 08/26/2023)     esomeprazole (NEXIUM) 40 MG capsule Take 40 mg by mouth daily. (Patient not taking: Reported on 08/26/2023)     hydrOXYzine (ATARAX/VISTARIL) 25 MG tablet Take 25 mg by mouth 3 (three) times daily as needed. (Patient  not taking: Reported on 08/26/2023)  1   methocarbamol (ROBAXIN) 500 MG tablet Take 500 mg by mouth 2 (two) times daily. (Patient not taking: Reported on 08/26/2023)     Multiple Vitamin (MULTIVITAMIN WITH MINERALS) TABS Take 1 tablet by mouth daily. (Patient not taking: Reported on 08/26/2023)     Potassium 99 MG TABS Take 99 mg by mouth daily. (Patient not taking: Reported on 08/26/2023)     Respiratory Therapy Supplies (FLUTTER) DEVI Use as directed (Patient not taking: Reported on 08/26/2023) 1 each 0  terbinafine (LAMISIL) 250 MG tablet Take 250 mg by mouth daily. (Patient not taking: Reported on 08/26/2023)     triamcinolone cream (KENALOG) 0.1 % Apply 1 application  topically daily as needed. For rash/ itching (Patient not taking: Reported on 08/26/2023)     No facility-administered medications prior to visit.    No family history on file.  Social History   Socioeconomic History   Marital status: Married    Spouse name: Not on file   Number of children: 0   Years of education: Not on file   Highest education level: Not on file  Occupational History   Occupation: Retired [employer]  Tobacco Use   Smoking status: Never   Smokeless tobacco: Never  Vaping Use   Vaping status: Never Used  Substance and Sexual Activity   Alcohol use: No   Drug use: No   Sexual activity: Not on file  Other Topics Concern   Not on file  Social History Narrative   Not on file   Social Drivers of Health   Financial Resource Strain: Low Risk  (02/17/2023)   Overall Financial Resource Strain (CARDIA)    Difficulty of Paying Living Expenses: Not hard at all  Food Insecurity: No Food Insecurity (02/17/2023)   Hunger Vital Sign    Worried About Running Out of Food in the Last Year: Never true    Ran Out of Food in the Last Year: Never true  Transportation Needs: No Transportation Needs (02/17/2023)   PRAPARE - Administrator, Civil Service (Medical): No    Lack of Transportation  (Non-Medical): No  Physical Activity: Inactive (02/17/2023)   Exercise Vital Sign    Days of Exercise per Week: 0 days    Minutes of Exercise per Session: 0 min  Stress: No Stress Concern Present (02/17/2023)   [employer] of Occupational Health - Occupational Stress Questionnaire    Feeling of Stress : Not at all  Social Connections: Moderately Integrated (02/17/2023)   Social Connection and Isolation Panel [NHANES]    Frequency of Communication with Friends and Family: More than three times a week    Frequency of Social Gatherings with Friends and Family: Twice a week    Attends Religious Services: More than 4 times per year    Active Member of [employer] or Organizations: No    Attends Banker Meetings: Never    Marital Status: Married  Catering manager Violence: Not At Risk (02/17/2023)   Humiliation, Afraid, Rape, and Kick questionnaire    Fear of Current or Ex-Partner: No    Emotionally Abused: No    Physically Abused: No    Sexually Abused: No                                                                                                  Objective:  Physical Exam: BP 128/76   Pulse 74   Temp 97.6 F (36.4 C) (Temporal)   Wt 201 lb (91.2 kg)   SpO2 97%   BMI 30.12 kg/m     Physical Exam Constitutional:  Appearance: Normal appearance.  HENT:     Head: Normocephalic and atraumatic.     Right Ear: Hearing normal.     Left Ear: Hearing normal.     Nose: Nose normal.  Eyes:     General: No scleral icterus.       Right eye: No discharge.        Left eye: No discharge.     Extraocular Movements: Extraocular movements intact.  Cardiovascular:     Rate and Rhythm: Normal rate and regular rhythm.     Heart sounds: Normal heart sounds.  Pulmonary:     Effort: Pulmonary effort is normal.     Breath sounds: Normal breath sounds.  Abdominal:     Palpations: Abdomen is soft.     Tenderness: There is no abdominal tenderness.  Musculoskeletal:      Right lower leg: Edema present.     Left lower leg: Edema present.  Skin:    General: Skin is warm.     Findings: No rash.  Neurological:     General: No focal deficit present.     Mental Status: He is alert.     Gait: Gait is intact.     Comments: Ambulates with cane  Psychiatric:        Mood and Affect: Mood normal.        Behavior: Behavior normal.        Thought Content: Thought content normal.        Judgment: Judgment normal.     No results found.  No results found for this or any previous visit (from the past 2160 hours).      Garner Nash, MD, MS

## 2023-08-28 ENCOUNTER — Encounter: Payer: Self-pay | Admitting: Physical Therapy

## 2023-08-28 ENCOUNTER — Ambulatory Visit: Payer: Medicare Other | Admitting: Physical Therapy

## 2023-08-28 DIAGNOSIS — R296 Repeated falls: Secondary | ICD-10-CM

## 2023-08-28 DIAGNOSIS — R2681 Unsteadiness on feet: Secondary | ICD-10-CM | POA: Diagnosis not present

## 2023-08-28 NOTE — Therapy (Signed)
 OUTPATIENT PHYSICAL THERAPY LOWER EXTREMITY TREATMENT    Patient Name: Tim Morgan MRN: [id number] DOB:1933-06-30, 89 y.o., male Today's Date: 08/28/2023        END OF SESSION:  PT End of Session - 08/28/23 1323     Visit Number 11    Date for PT Re-Evaluation 09/18/23    Authorization Type MCR    Progress Note Due on Visit 16    PT Start Time 1308   pt few minutes late   PT Stop Time 1346    PT Time Calculation (min) 38 min    Activity Tolerance Patient tolerated treatment well;Patient limited by pain   L knee   Behavior During Therapy [facility] for tasks assessed/performed                 Past Medical History:  Diagnosis Date   Acute bronchitis    COPD (chronic obstructive pulmonary disease) (HCC)    Diabetes mellitus    Edema of left lower extremity 07/03/2012   venous doppler-no evidence of thrombus or thrombophlebitis,left gsv valvular insufficency throughout the vein,left SSV appeared patent wiyh no venous insuffiency, left popliteal area questionable rupture Baker's cyst this was considered a abnormal doppler   GERD (gastroesophageal reflux disease)    History of back injury 07/01/1988   L 4 to , no surgery healed on own   Hypertension 12/06/2011   echo-EF 65-70%    Left ventricular hypertrophy    Melanoma (HCC)    Vitamin D deficiency    Past Surgical History:  Procedure Laterality Date   ANKLE SURGERY Left 07/01/1988   APPENDECTOMY  age 88   BRAIN SURGERY  07/01/1993   concusion   CATARACT EXTRACTION Right 04/03/2021   CATARACT EXTRACTION Left 05/01/2021   HERNIA REPAIR     x 2   KNEE ARTHROSCOPY Left 11/18/2012   Procedure: LEFT ARTHROSCOPY KNEE WITH DEBRIDEMENT AND CHRONDROLPLASTY;  Surgeon: Loanne Drilling, MD;  Location: [facility];  Service: Orthopedics;  Laterality: Left;   KNEE SURGERY     NASAL SINUS SURGERY Bilateral    to open up ear tubes   Patient Active Problem List   Diagnosis Date Noted   Nocturnal leg cramps 08/26/2023   Viral  URI with cough 05/27/2023   Neck pain 04/22/2023   Left hip pain 04/22/2023   Blurry vision, bilateral 04/22/2023   Acute pain of left knee 04/22/2023   H/O local excision of skin lesion on right lower leg 11/22/2022   Rash 11/22/2022   Vitamin D deficiency 11/22/2022   Cardiomyopathy (HCC) 11/22/2022   RBBB 11/22/2022   Acute cough 11/22/2022   Chest wall pain 11/22/2022   Class 1 obesity with serious comorbidity in adult 11/22/2022   Hyperlipidemia 11/22/2022   Acute pain of right shoulder 11/22/2022   Fall 11/22/2022   Preop general physical exam 11/22/2022   Venous stasis of both lower extremities 09/18/2022   Cellulitis of face 09/18/2022   Type 2 diabetes mellitus without complication, with long-term current use of insulin (HCC) 09/18/2022   Essential hypertension 09/10/2014   Upper airway cough syndrome 09/10/2014   Mild persistent asthma in adult without complication 08/17/2014   Chronic sinusitis/chronic cough  05/19/2014   Acute medial meniscal tear 11/18/2012    PCP: Fanny Bien, MD  REFERRING PROVIDER: same  REFERRING DIAG: recurrent falls, L hip and L knee pain  THERAPY DIAG:  Unsteady gait  Repeated falls  Rationale for Evaluation and Treatment: Rehabilitation  ONSET DATE: 10/2022  SUBJECTIVE:   SUBJECTIVE STATEMENT:   It feels like there's fluid in my knee that needs to be drawn out. They usually have to do this every 3 months. Balance is biggest concern. Not feeling great today, have been having some issues with my intestines the past couple days, not up to par.   EVAL: Reports 2 falls in the last year.  Most recently fell in his yard 3 weeks ago,when his wife fell on him and knocked him down.  Initially had much L hip and knee pain but now that pain is much improved.  Not noticing periods of staggering , does note that he uses chairs with rails on them , avoids chairs without arm rests. Has obtained st cane 2 weeks ago after his PCP convinced him he  needed it.  PERTINENT HISTORY: Larey Seat x 2 this year, referred by PCP for gait training, fall prevention training and pain L hip and knee after recent fall PAIN:  Are you having pain? Yes: NPRS scale: 3/10 Pain location: L knee  Pain description: "like knee needs to be drained", stiff like I need to move it  Aggravating factors: not clear  Relieving factors: movement to a certain extent     PRECAUTIONS: None  RED FLAGS: None   WEIGHT BEARING RESTRICTIONS: No  FALLS:  Has patient fallen in last 6 months? Yes. Number of falls 2  LIVING ENVIRONMENT: Lives with: lives with their family and lives with their spouse Lives in: House/apartment Stairs: No Has following equipment at home: Single point cane  OCCUPATION: retired from career in Company secretary  PLOF: Independent  PATIENT GOALS: prevent falls, be able to assist my wife with her walking, et   NEXT MD VISIT: unknown  OBJECTIVE:  Note: Objective measures were completed at Evaluation unless otherwise noted.  DIAGNOSTIC FINDINGS: CT negative head MRI normal L knee and hip  COGNITION: Overall cognitive status: Within functional limits for tasks assessed     SENSATION: Not tested  EDEMA: Patient reports B lower leg edema, has begun using compression stockings  MUSCLE LENGTH: Hamstrings: Right wfl deg; Left wfl deg POSTURE: rounded shoulders and forward head  PALPATION: na  LOWER EXTREMITY ROM:all ROM screening wnl B LEs  LOWER EXTREMITY MMT: MMT wfl today Coordination testing LE's wnl  FUNCTIONAL TESTS:  30 seconds chair stand test  10; 08/07/23- 11.5 (11 and then stood up, 30 second timer went off before he could sit back down) BERG:  43/56; 08/07/23- 48/53  GAIT: Distance walked: in clinic up to 70' Assistive device utilized: Single point cane Level of assistance: Modified independence Comments: B foot slap   TODAY'S TREATMENT:                                                                                                                               DATE:    08/28/23  Scifit bike L1.9 x6 minutes (limited resistance and time due to knee being irritated  today)  Tandem stance solid surface 3x30 seconds B Tandem gait in // bars forward and backwards x4 laps Narrow BOS with EC 3x30 seconds  Education on POC moving forward- will try 1x/week for now per his/spouse's request, recommended avoiding SLS activities on sore L knee         08/25/23  Nustep L5x8 minutes all four extremities for w/u, tissue perfusion, neural priming   HS stretches 2x30 seconds B  Calf stretches 2x30 seconds B Forward step ups 6 inch step minimal hands x10 each min guard for strength  Tandem stance blue foam pad 3x30 seconds B  Tandem gait one finger on each side x5 laps blue foam  Side stepping along blue foam x5 laps      08/13/23: proprioceptive/ therapeutic activities:   In ll bars for toe raises on airex, 15 reps In ll bars for high slow marches, mass practice  Nustep L 5 Ue's and LE's x 8 min Standing in ll bars with lead foot on 8" step, narrow stance, small 2" lifts lead foot, mass practice Standing in ll bars lead foot on 8" step, wider base of support, turning yellow medibar like steering wheel Unilateral standing in ll bar, 2 reps each leg , able to maintain up to 15 sec each Sit to stand from slightly elevated mat with red mediball, 2# forward punches 11 reps       PATIENT EDUCATION:  Education details: POC, goals Person educated: Patient Education method: Explanation, Demonstration, Tactile cues, Verbal cues, and Handouts Education comprehension: verbalized understanding  HOME EXERCISE PROGRAM: Access Code: [email or web address] URL: [email or web address] Date: 06/12/2023 Prepared by: Stacie Glaze  Exercises - Side Stepping with Counter Support  - 1 x daily - 7 x weekly - 1 sets - 10 reps - 3 hold - Standing Hip Abduction with Counter Support  - 1 x daily - 7 x weekly - 1  sets - 10 reps - 3 hold - Heel Toe Raises with Unilateral Counter Support  - 1 x daily - 7 x weekly - 1 sets - 10 reps - 3 hold - Standing March with Unilateral Counter Support  - 1 x daily - 7 x weekly - 1 sets - 10 reps - 3 hold - Standing Hip Extension with Unilateral Counter Support  - 1 x daily - 7 x weekly - 1 sets - 10 reps - 3 hold  ASSESSMENT:  CLINICAL IMPRESSION:  Pt arrives today not feeling 100% due to some stomach and knee issues the past couple of days. He is considering stopping PT for now, would like to speak to his spouse who is also in PT at this clinic. Focused on gentle exercises today and took it easy with rest breaks PRN, spent some time updating HEP with balance based exercises. Will try 1x/week and see how this works per his and spouse's request.     OBJECTIVE IMPAIRMENTS: decreased balance, decreased knowledge of condition, decreased knowledge of use of DME, and pain.   ACTIVITY LIMITATIONS: squatting, transfers, and locomotion level  PARTICIPATION LIMITATIONS: community activity, yard work, and church  PERSONAL FACTORS: Age, Behavior pattern, Fitness, and 1-2 comorbidities: DM, COPD  are also affecting patient's functional outcome.   REHAB POTENTIAL: Good  CLINICAL DECISION MAKING: Stable/uncomplicated  EVALUATION COMPLEXITY: Low   GOALS: Goals reviewed with patient? Yes  SHORT TERM GOALS: Target date: 3 weeks 05/28/23 did not update STG as it was already met   1.  IHEP Goal status: issued 07/07/23-met   LONG  TERM GOALS: Target date: 09/18/2023    Sharlene Motts score improve from 43/56 to 50/56  Baseline:  Goal status: ONGOING 08/27/23 48  2.  30 sec sit to stand greater than 12 Baseline: 10 Goal status: ONGOING 08/07/23- 11.5  3.  I and safe curb negotiation with st cane Baseline: not assessed yet Goal status: ONGOING 08/07/23- min guard, unsteady no device (not using SPC on his own so did without a device)   PLAN:  PT FREQUENCY: 2x/week  PT  DURATION: 6 weeks  PLANNED INTERVENTIONS: 97110-Therapeutic exercises, 97530- Therapeutic activity, 97112- Neuromuscular re-education, 97535- Self Care, 16109- Manual therapy, and 97116- Gait training  PLAN FOR NEXT SESSION: will continue to progress strength and balance to advance his function and safety, encourage HEP compliance. How is new frequency?    Nedra Hai, PT, DPT 08/28/23 1:49 PM

## 2023-09-12 ENCOUNTER — Encounter: Payer: Self-pay | Admitting: Physical Therapy

## 2023-09-12 ENCOUNTER — Ambulatory Visit: Payer: Medicare Other | Attending: Family Medicine | Admitting: Physical Therapy

## 2023-09-12 DIAGNOSIS — R296 Repeated falls: Secondary | ICD-10-CM | POA: Insufficient documentation

## 2023-09-12 DIAGNOSIS — R2681 Unsteadiness on feet: Secondary | ICD-10-CM | POA: Diagnosis present

## 2023-09-12 NOTE — Therapy (Signed)
 OUTPATIENT PHYSICAL THERAPY LOWER EXTREMITY TREATMENT    Patient Name: Tim Morgan MRN: [id number] DOB:[birth year], 89 y.o., male Today's Date: 09/12/2023        END OF SESSION:  PT End of Session - 09/12/23 0928     Visit Number 12    Date for PT Re-Evaluation 09/18/23    PT Start Time 0930    PT Stop Time 1015    PT Time Calculation (min) 45 min    Activity Tolerance Patient tolerated treatment well    Behavior During Therapy [facility] for tasks assessed/performed                 Past Medical History:  Diagnosis Date   Acute bronchitis    COPD (chronic obstructive pulmonary disease) (HCC)    Diabetes mellitus    Edema of left lower extremity 07/03/2012   venous doppler-no evidence of thrombus or thrombophlebitis,left gsv valvular insufficency throughout the vein,left SSV appeared patent wiyh no venous insuffiency, left popliteal area questionable rupture Baker's cyst this was considered a abnormal doppler   GERD (gastroesophageal reflux disease)    History of back injury 07/01/1988   L 4 to , no surgery healed on own   Hypertension 12/06/2011   echo-EF 65-70%    Left ventricular hypertrophy    Melanoma (HCC)    Vitamin D deficiency    Past Surgical History:  Procedure Laterality Date   ANKLE SURGERY Left 07/01/1988   APPENDECTOMY  age 10   BRAIN SURGERY  07/01/1993   concusion   CATARACT EXTRACTION Right 04/03/2021   CATARACT EXTRACTION Left 05/01/2021   HERNIA REPAIR     x 2   KNEE ARTHROSCOPY Left 11/18/2012   Procedure: LEFT ARTHROSCOPY KNEE WITH DEBRIDEMENT AND CHRONDROLPLASTY;  Surgeon: Loanne Drilling, MD;  Location: [facility];  Service: Orthopedics;  Laterality: Left;   KNEE SURGERY     NASAL SINUS SURGERY Bilateral    to open up ear tubes   Patient Active Problem List   Diagnosis Date Noted   Nocturnal leg cramps 08/26/2023   Viral URI with cough 05/27/2023   Neck pain 04/22/2023   Left hip pain 04/22/2023   Blurry vision, bilateral 04/22/2023    Acute pain of left knee 04/22/2023   H/O local excision of skin lesion on right lower leg 11/22/2022   Rash 11/22/2022   Vitamin D deficiency 11/22/2022   Cardiomyopathy (HCC) 11/22/2022   RBBB 11/22/2022   Acute cough 11/22/2022   Chest wall pain 11/22/2022   Class 1 obesity with serious comorbidity in adult 11/22/2022   Hyperlipidemia 11/22/2022   Acute pain of right shoulder 11/22/2022   Fall 11/22/2022   Preop general physical exam 11/22/2022   Venous stasis of both lower extremities 09/18/2022   Cellulitis of face 09/18/2022   Type 2 diabetes mellitus without complication, with long-term current use of insulin (HCC) 09/18/2022   Essential hypertension 09/10/2014   Upper airway cough syndrome 09/10/2014   Mild persistent asthma in adult without complication 08/17/2014   Chronic sinusitis/chronic cough  05/19/2014   Acute medial meniscal tear 11/18/2012    PCP: Fanny Bien, MD  REFERRING PROVIDER: same  REFERRING DIAG: recurrent falls, L hip and L knee pain  THERAPY DIAG:  Unsteady gait  Repeated falls  Rationale for Evaluation and Treatment: Rehabilitation  ONSET DATE: 10/2022  SUBJECTIVE:   SUBJECTIVE STATEMENT:   "I guess I'm in fairly good shape"  EVAL: Reports 2 falls in the last year.  Most recently  fell in his yard 3 weeks ago,when his wife fell on him and knocked him down.  Initially had much L hip and knee pain but now that pain is much improved.  Not noticing periods of staggering , does note that he uses chairs with rails on them , avoids chairs without arm rests. Has obtained st cane 2 weeks ago after his PCP convinced him he needed it.  PERTINENT HISTORY: Larey Seat x 2 this year, referred by PCP for gait training, fall prevention training and pain L hip and knee after recent fall PAIN:  Are you having pain? Yes: NPRS scale: 3/10 Pain location: L knee  Pain description: "like knee needs to be drained", stiff like I need to move it  Aggravating  factors: not clear  Relieving factors: movement to a certain extent     PRECAUTIONS: None  RED FLAGS: None   WEIGHT BEARING RESTRICTIONS: No  FALLS:  Has patient fallen in last 6 months? Yes. Number of falls 2  LIVING ENVIRONMENT: Lives with: lives with their family and lives with their spouse Lives in: House/apartment Stairs: No Has following equipment at home: Single point cane  OCCUPATION: retired from career in Company secretary  PLOF: Independent  PATIENT GOALS: prevent falls, be able to assist my wife with her walking, et   NEXT MD VISIT: unknown  OBJECTIVE:  Note: Objective measures were completed at Evaluation unless otherwise noted.  DIAGNOSTIC FINDINGS: CT negative head MRI normal L knee and hip  COGNITION: Overall cognitive status: Within functional limits for tasks assessed     SENSATION: Not tested  EDEMA: Patient reports B lower leg edema, has begun using compression stockings  MUSCLE LENGTH: Hamstrings: Right wfl deg; Left wfl deg POSTURE: rounded shoulders and forward head  PALPATION: na  LOWER EXTREMITY ROM:all ROM screening wnl B LEs  LOWER EXTREMITY MMT: MMT wfl today Coordination testing LE's wnl  FUNCTIONAL TESTS:  30 seconds chair stand test  10; 08/07/23- 11.5 (11 and then stood up, 30 second timer went off before he could sit back down) BERG:  43/56; 08/07/23- 48/53  GAIT: Distance walked: in clinic up to 70' Assistive device utilized: Single point cane Level of assistance: Modified independence Comments: B foot slap   TODAY'S TREATMENT:                                                                                                                              DATE:  09/12/23 Bike L 4 x 6 min Slant board calf stretch  On airex reaching outside BOS  Side steps and backward walking Static tandem stance 3x10'' each Sit to stand form elevated surface 2x10 Tandem walking  08/28/23  Scifit bike L1.9 x6 minutes (limited resistance  and time due to knee being irritated today)  Tandem stance solid surface 3x30 seconds B Tandem gait in // bars forward and backwards x4 laps Narrow BOS with EC 3x30 seconds  Education on POC moving forward-  will try 1x/week for now per his/spouse's request, recommended avoiding SLS activities on sore L knee     08/25/23  Nustep L5x8 minutes all four extremities for w/u, tissue perfusion, neural priming   HS stretches 2x30 seconds B  Calf stretches 2x30 seconds B Forward step ups 6 inch step minimal hands x10 each min guard for strength  Tandem stance blue foam pad 3x30 seconds B  Tandem gait one finger on each side x5 laps blue foam  Side stepping along blue foam x5 laps      08/13/23: proprioceptive/ therapeutic activities:   In ll bars for toe raises on airex, 15 reps In ll bars for high slow marches, mass practice  Nustep L 5 Ue's and LE's x 8 min Standing in ll bars with lead foot on 8" step, narrow stance, small 2" lifts lead foot, mass practice Standing in ll bars lead foot on 8" step, wider base of support, turning yellow medibar like steering wheel Unilateral standing in ll bar, 2 reps each leg , able to maintain up to 15 sec each Sit to stand from slightly elevated mat with red mediball, 2# forward punches 11 reps       PATIENT EDUCATION:  Education details: POC, goals Person educated: Patient Education method: Explanation, Demonstration, Tactile cues, Verbal cues, and Handouts Education comprehension: verbalized understanding  HOME EXERCISE PROGRAM: Access Code: [email or web address] URL: [email or web address] Date: 06/12/2023 Prepared by: Stacie Glaze  Exercises - Side Stepping with Counter Support  - 1 x daily - 7 x weekly - 1 sets - 10 reps - 3 hold - Standing Hip Abduction with Counter Support  - 1 x daily - 7 x weekly - 1 sets - 10 reps - 3 hold - Heel Toe Raises with Unilateral Counter Support  - 1 x daily - 7 x weekly - 1 sets - 10 reps - 3  hold - Standing March with Unilateral Counter Support  - 1 x daily - 7 x weekly - 1 sets - 10 reps - 3 hold - Standing Hip Extension with Unilateral Counter Support  - 1 x daily - 7 x weekly - 1 sets - 10 reps - 3 hold  ASSESSMENT:  CLINICAL IMPRESSION:  Pt arrives doing well. Session focused on balance and strength. Instability on airex reaching outside base of support. Momentum needed with sit to stands. No pain during session. CGA needed with tandem stance, tandem walking, and backwards walking.     OBJECTIVE IMPAIRMENTS: decreased balance, decreased knowledge of condition, decreased knowledge of use of DME, and pain.   ACTIVITY LIMITATIONS: squatting, transfers, and locomotion level  PARTICIPATION LIMITATIONS: community activity, yard work, and church  PERSONAL FACTORS: Age, Behavior pattern, Fitness, and 1-2 comorbidities: DM, COPD  are also affecting patient's functional outcome.   REHAB POTENTIAL: Good  CLINICAL DECISION MAKING: Stable/uncomplicated  EVALUATION COMPLEXITY: Low   GOALS: Goals reviewed with patient? Yes  SHORT TERM GOALS: Target date: 3 weeks 05/28/23 did not update STG as it was already met   1.  IHEP Goal status: issued 07/07/23-met   LONG TERM GOALS: Target date: 09/18/2023    Sharlene Motts score improve from 43/56 to 50/56  Baseline:  Goal status: ONGOING 08/27/23 48  2.  30 sec sit to stand greater than 12 Baseline: 10 Goal status: ONGOING 08/07/23- 11.5  3.  I and safe curb negotiation with st cane Baseline: not assessed yet Goal status: ONGOING 08/07/23- min guard, unsteady no device (not using SPC on his own so  did without a device)   PLAN:  PT FREQUENCY: 2x/week  PT DURATION: 6 weeks  PLANNED INTERVENTIONS: 97110-Therapeutic exercises, 97530- Therapeutic activity, [id number]- Neuromuscular re-education, 97535- Self Care, 69629- Manual therapy, and 97116- Gait training  PLAN FOR NEXT SESSION: will continue to progress strength and balance to  advance his function and safety  Debroah Baller, PTA 09/12/23 9:29 AM

## 2023-09-15 NOTE — Therapy (Incomplete)
 OUTPATIENT PHYSICAL THERAPY LOWER EXTREMITY TREATMENT    Patient Name: Tim Morgan MRN: [id number] DOB:08/06/1932, 89 y.o., male Today's Date: 09/16/2023        END OF SESSION:  PT End of Session - 09/16/23 0931     Visit Number 13    Date for PT Re-Evaluation 09/18/23    PT Start Time 0930    PT Stop Time 1015    PT Time Calculation (min) 45 min    Activity Tolerance Patient tolerated treatment well    Behavior During Therapy [facility] for tasks assessed/performed                  Past Medical History:  Diagnosis Date   Acute bronchitis    COPD (chronic obstructive pulmonary disease) (HCC)    Diabetes mellitus    Edema of left lower extremity 07/03/2012   venous doppler-no evidence of thrombus or thrombophlebitis,left gsv valvular insufficency throughout the vein,left SSV appeared patent wiyh no venous insuffiency, left popliteal area questionable rupture Baker's cyst this was considered a abnormal doppler   GERD (gastroesophageal reflux disease)    History of back injury 07/01/1988   L 4 to , no surgery healed on own   Hypertension 12/06/2011   echo-EF 65-70%    Left ventricular hypertrophy    Melanoma (HCC)    Vitamin D deficiency    Past Surgical History:  Procedure Laterality Date   ANKLE SURGERY Left 07/01/1988   APPENDECTOMY  age 89   BRAIN SURGERY  07/01/1993   concusion   CATARACT EXTRACTION Right 04/03/2021   CATARACT EXTRACTION Left 05/01/2021   HERNIA REPAIR     x 2   KNEE ARTHROSCOPY Left 11/18/2012   Procedure: LEFT ARTHROSCOPY KNEE WITH DEBRIDEMENT AND CHRONDROLPLASTY;  Surgeon: Loanne Drilling, MD;  Location: [facility];  Service: Orthopedics;  Laterality: Left;   KNEE SURGERY     NASAL SINUS SURGERY Bilateral    to open up ear tubes   Patient Active Problem List   Diagnosis Date Noted   Nocturnal leg cramps 08/26/2023   Viral URI with cough 05/27/2023   Neck pain 04/22/2023   Left hip pain 04/22/2023   Blurry vision, bilateral  04/22/2023   Acute pain of left knee 04/22/2023   H/O local excision of skin lesion on right lower leg 11/22/2022   Rash 11/22/2022   Vitamin D deficiency 11/22/2022   Cardiomyopathy (HCC) 11/22/2022   RBBB 11/22/2022   Acute cough 11/22/2022   Chest wall pain 11/22/2022   Class 1 obesity with serious comorbidity in adult 11/22/2022   Hyperlipidemia 11/22/2022   Acute pain of right shoulder 11/22/2022   Fall 11/22/2022   Preop general physical exam 11/22/2022   Venous stasis of both lower extremities 09/18/2022   Cellulitis of face 09/18/2022   Type 2 diabetes mellitus without complication, with long-term current use of insulin (HCC) 09/18/2022   Essential hypertension 09/10/2014   Upper airway cough syndrome 09/10/2014   Mild persistent asthma in adult without complication 08/17/2014   Chronic sinusitis/chronic cough  05/19/2014   Acute medial meniscal tear 11/18/2012    PCP: Fanny Bien, MD  REFERRING PROVIDER: same  REFERRING DIAG: recurrent falls, L hip and L knee pain  THERAPY DIAG:  Unsteady gait  Rationale for Evaluation and Treatment: Rehabilitation  ONSET DATE: 10/2022  SUBJECTIVE:   SUBJECTIVE STATEMENT: I am vertical, I am a little off balance.   EVAL: Reports 2 falls in the last year.  Most recently fell  in his yard 3 weeks ago,when his wife fell on him and knocked him down.  Initially had much L hip and knee pain but now that pain is much improved.  Not noticing periods of staggering , does note that he uses chairs with rails on them , avoids chairs without arm rests. Has obtained st cane 2 weeks ago after his PCP convinced him he needed it.  PERTINENT HISTORY: Larey Seat x 2 this year, referred by PCP for gait training, fall prevention training and pain L hip and knee after recent fall PAIN:  Are you having pain? Yes: NPRS scale: 3/10 Pain location: L knee  Pain description: "like knee needs to be drained", stiff like I need to move it  Aggravating factors:  not clear  Relieving factors: movement to a certain extent     PRECAUTIONS: None  RED FLAGS: None   WEIGHT BEARING RESTRICTIONS: No  FALLS:  Has patient fallen in last 6 months? Yes. Number of falls 2  LIVING ENVIRONMENT: Lives with: lives with their family and lives with their spouse Lives in: House/apartment Stairs: No Has following equipment at home: Single point cane  OCCUPATION: retired from career in Company secretary  PLOF: Independent  PATIENT GOALS: prevent falls, be able to assist my wife with her walking, et   NEXT MD VISIT: unknown  OBJECTIVE:  Note: Objective measures were completed at Evaluation unless otherwise noted.  DIAGNOSTIC FINDINGS: CT negative head MRI normal L knee and hip  COGNITION: Overall cognitive status: Within functional limits for tasks assessed     SENSATION: Not tested  EDEMA: Patient reports B lower leg edema, has begun using compression stockings  MUSCLE LENGTH: Hamstrings: Right wfl deg; Left wfl deg POSTURE: rounded shoulders and forward head  PALPATION: na  LOWER EXTREMITY ROM:all ROM screening wnl B LEs  LOWER EXTREMITY MMT: MMT wfl today Coordination testing LE's wnl  FUNCTIONAL TESTS:  30 seconds chair stand test  10; 08/07/23- 11.5 (11 and then stood up, 30 second timer went off before he could sit back down) BERG:  43/56; 08/07/23- 48/53  GAIT: Distance walked: in clinic up to 70' Assistive device utilized: Single point cane Level of assistance: Modified independence Comments: B foot slap   TODAY'S TREATMENT:                                                                                                                              DATE:  09/15/23 Bike L4 x33mins  Recheck goals for recert  -30s chair test- 14 reps -BERG- 50/56 Side steps over obstacles  Walking on beam Standing on airex  -eyes open and closed -reaching for numbers  -marching  [place]one taps STS with chest press 2x10   09/12/23 Bike L 4 x 6  min Slant board calf stretch  On airex reaching outside BOS  Side steps and backward walking Static tandem stance 3x10'' each Sit to stand form elevated surface 2x10 Tandem walking  08/28/23  Scifit bike L1.9 x6 minutes (limited resistance and time due to knee being irritated today)  Tandem stance solid surface 3x30 seconds B Tandem gait in // bars forward and backwards x4 laps Narrow BOS with EC 3x30 seconds  Education on POC moving forward- will try 1x/week for now per his/spouse's request, recommended avoiding SLS activities on sore L knee     08/25/23  Nustep L5x8 minutes all four extremities for w/u, tissue perfusion, neural priming   HS stretches 2x30 seconds B  Calf stretches 2x30 seconds B Forward step ups 6 inch step minimal hands x10 each min guard for strength  Tandem stance blue foam pad 3x30 seconds B  Tandem gait one finger on each side x5 laps blue foam  Side stepping along blue foam x5 laps      08/13/23: proprioceptive/ therapeutic activities:   In ll bars for toe raises on airex, 15 reps In ll bars for high slow marches, mass practice  Nustep L 5 Ue's and LE's x 8 min Standing in ll bars with lead foot on 8" step, narrow stance, small 2" lifts lead foot, mass practice Standing in ll bars lead foot on 8" step, wider base of support, turning yellow medibar like steering wheel Unilateral standing in ll bar, 2 reps each leg , able to maintain up to 15 sec each Sit to stand from slightly elevated mat with red mediball, 2# forward punches 11 reps       PATIENT EDUCATION:  Education details: POC, goals Person educated: Patient Education method: Explanation, Demonstration, Tactile cues, Verbal cues, and Handouts Education comprehension: verbalized understanding  HOME EXERCISE PROGRAM: Access Code: [email or web address] URL: [email or web address] Date: 06/12/2023 Prepared by: Stacie Glaze  Exercises - Side Stepping with Counter Support  - 1  x daily - 7 x weekly - 1 sets - 10 reps - 3 hold - Standing Hip Abduction with Counter Support  - 1 x daily - 7 x weekly - 1 sets - 10 reps - 3 hold - Heel Toe Raises with Unilateral Counter Support  - 1 x daily - 7 x weekly - 1 sets - 10 reps - 3 hold - Standing March with Unilateral Counter Support  - 1 x daily - 7 x weekly - 1 sets - 10 reps - 3 hold - Standing Hip Extension with Unilateral Counter Support  - 1 x daily - 7 x weekly - 1 sets - 10 reps - 3 hold  ASSESSMENT:  CLINICAL IMPRESSION:  Pt arrives doing well. Session focused on balance and strength. Some instability on airex and single leg activities but does well not losing balance and able to use good stepping strategies.No pain during session. Has met all of his goals. He and his wife have agreed to wrap up PT today.    OBJECTIVE IMPAIRMENTS: decreased balance, decreased knowledge of condition, decreased knowledge of use of DME, and pain.   ACTIVITY LIMITATIONS: squatting, transfers, and locomotion level  PARTICIPATION LIMITATIONS: community activity, yard work, and church  PERSONAL FACTORS: Age, Behavior pattern, Fitness, and 1-2 comorbidities: DM, COPD  are also affecting patient's functional outcome.   REHAB POTENTIAL: Good  CLINICAL DECISION MAKING: Stable/uncomplicated  EVALUATION COMPLEXITY: Low   GOALS: Goals reviewed with patient? Yes  SHORT TERM GOALS: Target date: 3 weeks 05/28/23 did not update STG as it was already met   1.  IHEP Goal status: issued 07/07/23-met   LONG TERM GOALS: Target date: 09/18/2023    Berg score improve from 43/56  to 50/56  Baseline:  Goal status: ONGOING 08/27/23 48, 50 MET 09/16/23  2.  30 sec sit to stand greater than 12 Baseline: 10 Goal status: ONGOING 08/07/23- 11.5, 14 MET 09/16/23  3.  I and safe curb negotiation with st cane Baseline: not assessed yet Goal status: ONGOING 08/07/23- min guard, unsteady no device (not using SPC on his own so did without a device),  MET 09/16/23   PLAN:  PT FREQUENCY: 2x/week  PT DURATION: 6 weeks  PLANNED INTERVENTIONS: 97110-Therapeutic exercises, 97530- Therapeutic activity, 97112- Neuromuscular re-education, 97535- Self Care, [id number]- Manual therapy, and 97116- Gait training  PLAN FOR NEXT SESSION: d/c  PHYSICAL THERAPY DISCHARGE SUMMARY  Visits from Start of Care: 13  Patient agrees to discharge. Patient goals were met. Patient is being discharged due to meeting the stated rehab goals.  Cassie Freer, PT, DPT 09/16/23 10:12 AM

## 2023-09-16 ENCOUNTER — Ambulatory Visit: Payer: Medicare Other

## 2023-09-16 DIAGNOSIS — R2681 Unsteadiness on feet: Secondary | ICD-10-CM | POA: Diagnosis not present

## 2023-09-23 ENCOUNTER — Ambulatory Visit: Payer: Medicare Other

## 2023-09-23 ENCOUNTER — Ambulatory Visit: Payer: Medicare Other | Admitting: Physical Therapy

## 2023-09-30 ENCOUNTER — Ambulatory Visit: Payer: Medicare Other | Admitting: Physical Therapy

## 2023-11-05 ENCOUNTER — Other Ambulatory Visit: Payer: Self-pay | Admitting: Family Medicine

## 2023-11-17 ENCOUNTER — Other Ambulatory Visit: Payer: Self-pay

## 2023-11-17 MED ORDER — TAMSULOSIN HCL 0.4 MG PO CAPS: 0.4000 mg | ORAL_CAPSULE | Freq: Every day | ORAL | 1 refills | Status: DC

## 2023-11-17 MED ORDER — OMEPRAZOLE 20 MG PO CPDR: 20.0000 mg | DELAYED_RELEASE_CAPSULE | Freq: Every day | ORAL | 1 refills | Status: DC

## 2023-12-22 ENCOUNTER — Other Ambulatory Visit: Payer: Self-pay

## 2023-12-22 ENCOUNTER — Emergency Department (HOSPITAL_BASED_OUTPATIENT_CLINIC_OR_DEPARTMENT_OTHER)
Admission: EM | Admit: 2023-12-22 | Discharge: 2023-12-23 | Disposition: A | Discharge status: Home / Self Care | Attending: Emergency Medicine | Admitting: Emergency Medicine

## 2023-12-22 ENCOUNTER — Emergency Department (HOSPITAL_BASED_OUTPATIENT_CLINIC_OR_DEPARTMENT_OTHER)

## 2023-12-22 ENCOUNTER — Encounter (HOSPITAL_BASED_OUTPATIENT_CLINIC_OR_DEPARTMENT_OTHER): Payer: Self-pay

## 2023-12-22 DIAGNOSIS — W01198A Fall on same level from slipping, tripping and stumbling with subsequent striking against other object, initial encounter: Secondary | ICD-10-CM | POA: Diagnosis not present

## 2023-12-22 DIAGNOSIS — W19XXXA Unspecified fall, initial encounter: Secondary | ICD-10-CM

## 2023-12-22 DIAGNOSIS — S51812A Laceration without foreign body of left forearm, initial encounter: Secondary | ICD-10-CM | POA: Diagnosis not present

## 2023-12-22 DIAGNOSIS — Z7984 Long term (current) use of oral hypoglycemic drugs: Secondary | ICD-10-CM | POA: Diagnosis not present

## 2023-12-22 DIAGNOSIS — I10 Essential (primary) hypertension: Secondary | ICD-10-CM | POA: Diagnosis not present

## 2023-12-22 DIAGNOSIS — E119 Type 2 diabetes mellitus without complications: Secondary | ICD-10-CM | POA: Diagnosis not present

## 2023-12-22 DIAGNOSIS — Z794 Long term (current) use of insulin: Secondary | ICD-10-CM | POA: Insufficient documentation

## 2023-12-22 DIAGNOSIS — S46912A Strain of unspecified muscle, fascia and tendon at shoulder and upper arm level, left arm, initial encounter: Secondary | ICD-10-CM | POA: Diagnosis not present

## 2023-12-22 DIAGNOSIS — R944 Abnormal results of kidney function studies: Secondary | ICD-10-CM | POA: Diagnosis not present

## 2023-12-22 DIAGNOSIS — S82092A Other fracture of left patella, initial encounter for closed fracture: Secondary | ICD-10-CM | POA: Insufficient documentation

## 2023-12-22 DIAGNOSIS — S8992XA Unspecified injury of left lower leg, initial encounter: Secondary | ICD-10-CM | POA: Diagnosis present

## 2023-12-22 DIAGNOSIS — Z79899 Other long term (current) drug therapy: Secondary | ICD-10-CM | POA: Diagnosis not present

## 2023-12-22 LAB — CBG MONITORING, ED: Glucose-Capillary: 165 mg/dL — ABNORMAL HIGH (ref 70–99)

## 2023-12-22 NOTE — ED Notes (Addendum)
 Skin tear on left forearm cleansed with wound cleanser and dressed. Petroleum gauze applied to wound bed, wrapped in gauze and secured with medipore tape.

## 2023-12-22 NOTE — ED Triage Notes (Signed)
 Pt presents with injury from a fall. Pt states his shoe stuck to the floor and he fell on his L side. Pt denies head injury and is not anticoagulated. Pt has a skin tear to the L forearm, and swelling to the L knee.

## 2023-12-23 ENCOUNTER — Emergency Department (HOSPITAL_BASED_OUTPATIENT_CLINIC_OR_DEPARTMENT_OTHER)

## 2023-12-23 DIAGNOSIS — S82092A Other fracture of left patella, initial encounter for closed fracture: Secondary | ICD-10-CM | POA: Diagnosis not present

## 2023-12-23 LAB — CBC WITH DIFFERENTIAL/PLATELET
Abs Immature Granulocytes: 0.01 10*3/uL (ref 0.00–0.07)
Basophils Absolute: 0.1 10*3/uL (ref 0.0–0.1)
Basophils Relative: 1 %
Eosinophils Absolute: 0.4 10*3/uL (ref 0.0–0.5)
Eosinophils Relative: 5 %
HCT: 38.4 % — ABNORMAL LOW (ref 39.0–52.0)
Hemoglobin: 12.5 g/dL — ABNORMAL LOW (ref 13.0–17.0)
Immature Granulocytes: 0 %
Lymphocytes Relative: 25 %
Lymphs Abs: 2.2 10*3/uL (ref 0.7–4.0)
MCH: 30.7 pg (ref 26.0–34.0)
MCHC: 32.6 g/dL (ref 30.0–36.0)
MCV: 94.3 fL (ref 80.0–100.0)
Monocytes Absolute: 0.7 10*3/uL (ref 0.1–1.0)
Monocytes Relative: 8 %
Neutro Abs: 5.3 10*3/uL (ref 1.7–7.7)
Neutrophils Relative %: 61 %
Platelets: 221 10*3/uL (ref 150–400)
RBC: 4.07 MIL/uL — ABNORMAL LOW (ref 4.22–5.81)
RDW: 13.1 % (ref 11.5–15.5)
WBC: 8.7 10*3/uL (ref 4.0–10.5)
nRBC: 0 % (ref 0.0–0.2)

## 2023-12-23 LAB — URINALYSIS, ROUTINE W REFLEX MICROSCOPIC
Bilirubin Urine: NEGATIVE
Glucose, UA: NEGATIVE mg/dL
Hgb urine dipstick: NEGATIVE
Ketones, ur: NEGATIVE mg/dL
Leukocytes,Ua: NEGATIVE
Nitrite: NEGATIVE
Protein, ur: NEGATIVE mg/dL
Specific Gravity, Urine: 1.02 (ref 1.005–1.030)
pH: 6 (ref 5.0–8.0)

## 2023-12-23 LAB — BASIC METABOLIC PANEL WITH GFR
Anion gap: 12 (ref 5–15)
BUN: 38 mg/dL — ABNORMAL HIGH (ref 8–23)
CO2: 24 mmol/L (ref 22–32)
Calcium: 9.1 mg/dL (ref 8.9–10.3)
Chloride: 104 mmol/L (ref 98–111)
Creatinine, Ser: 1.44 mg/dL — ABNORMAL HIGH (ref 0.61–1.24)
GFR, Estimated: 47 mL/min — ABNORMAL LOW (ref >60)
Glucose, Bld: 106 mg/dL — ABNORMAL HIGH (ref 70–99)
Potassium: 4.1 mmol/L (ref 3.5–5.1)
Sodium: 140 mmol/L (ref 135–145)

## 2023-12-23 MED ORDER — ACETAMINOPHEN 325 MG PO TABS
650.0000 mg | ORAL_TABLET | Freq: Once | ORAL | Status: AC
  Administered 2023-12-23: 650 mg via ORAL
  Filled 2023-12-23: qty 2

## 2023-12-23 NOTE — ED Provider Notes (Signed)
 [place] [facility]PART[facility] AT [facility] Provider Note   CSN: [id number] Arrival date & time: 12/22/23  2121     Patient presents with: Tim Morgan Bascom is a 89 y.o. male.   The history is provided by the patient, the spouse and medical records.  Fall  Tim Morgan is a 89 y.o. male who presents to the Emergency Department complaining of fall.  He presents the emergency department accompanied by his wife and a friend for evaluation of injuries fungoid fall that occurred 2 hours prior to ED arrival.  He states that he was walking across the kitchen when his foot stuck to the floor, resulting in him falling forward and landing on his left knee.  He did not hit his head or pass out.  He did hit his left arm when he fell.  He at baseline has balance issues and ambulates with a cane.  He complains of soreness to his left shoulder that started after ED arrival.  He does not have any pain to his head, neck, chest, abdomen, back, hips.  He does have a history of diabetes and hypertension.      Prior to Admission medications   Medication Sig Start Date End Date Taking? Authorizing Provider  acetaminophen  (TYLENOL ) 650 MG CR tablet Take 650 mg by mouth every 8 (eight) hours as needed for pain.    [provider]  Ascorbic Acid (VITAMIN C) 1000 MG tablet Take 1,000 mg by mouth. 10/05/15   [provider]  baclofen  (LIORESAL ) 10 MG tablet Take 1 tablet (10 mg total) by mouth 2 (two) times daily as needed (leg cramps). 08/26/23 08/20/24  Sebastian Beverley NOVAK, MD  Chromium-Cinnamon (CINNAMON PLUS CHROMIUM PO) Take 2-3 capsules by mouth 2 (two) times daily.    [provider]  ciclopirox (PENLAC) 8 % solution     [provider]  Coenzyme Q10 (COQ10) 100 MG CAPS Take 1 capsule by mouth daily.    [provider]  cyanocobalamin (VITAMIN B12) 1000 MCG tablet Take 1,000 mcg by mouth daily.    [provider]  docusate sodium (COLACE)  100 MG capsule Take 100 mg by mouth at bedtime.    [provider]  Ergocalciferol  50 MCG (2000 UT) CAPS Take 50 mcg by mouth once a week. 02/18/22   [provider]  famotidine  (PEPCID ) 20 MG tablet Take 20 mg by mouth at bedtime. Reported on 10/18/2015 07/09/14   [provider]  ferrous sulfate 325 (65 FE) MG tablet Take 1 tablet by mouth daily. 10/05/15   [provider]  fluticasone  (FLONASE ) 50 MCG/ACT nasal spray Place 2 sprays into both nostrils daily. 05/27/23 08/25/23  Sebastian Beverley NOVAK, MD  furosemide  (LASIX ) 40 MG tablet Take 1 tablet (40 mg total) by mouth daily. 05/12/23 05/06/24  Sebastian Beverley NOVAK, MD  insulin aspart (NOVOLOG) 100 UNIT/ML FlexPen Inject 6 Units into the skin 3 (three) times daily with meals.    [provider]  insulin glargine (LANTUS) 100 UNIT/ML injection Inject 18 Units into the skin at bedtime.    [provider]  ipratropium-albuterol (DUONEB) 0.5-2.5 (3) MG/3ML SOLN Reported on 10/18/2015 Patient not taking: Reported on 08/26/2023 05/03/14   [provider]  losartan  (COZAAR ) 50 MG tablet TAKE 1 TABLET DAILY 11/05/23   Sebastian Beverley NOVAK, MD  magnesium oxide (MAG-OX) 400 MG tablet Take 400 mg by mouth daily.    [provider]  metFORMIN (GLUMETZA) 500  MG (MOD) 24 hr tablet Take 500 mg by mouth 2 (two) times daily with a meal.    [provider]  Multiple Vitamins-Minerals (PRESERVISION AREDS) TABS Take 1 tablet by mouth daily.    [provider]  omeprazole  (PRILOSEC) 20 MG capsule Take 1 capsule (20 mg total) by mouth daily. 11/17/23   Webb, Padonda B, FNP  rosuvastatin  (CRESTOR ) 40 MG tablet TAKE 1 TABLET AT BEDTIME 11/05/23   Sebastian Beverley NOVAK, MD  sildenafil (VIAGRA) 100 MG tablet Take 100 mg by mouth daily as needed for erectile dysfunction.    [provider]  tamsulosin  (FLOMAX ) 0.4 MG CAPS capsule Take 1 capsule (0.4 mg total) by mouth daily. 11/17/23   Douglass Caul B,  FNP  TRULICITY 0.75 MG/0.5ML [facility]  10/12/19   [provider]  Zinc  50 MG TABS Take by mouth.    [provider]    Allergies: Sitagliptin, Wound dressing adhesive, and Tape    Review of Systems  All other systems reviewed and are negative.   Updated Vital Signs BP 125/70 (BP Location: Right Arm)   Pulse 66   Temp 98.2 F (36.8 C) (Oral)   Resp 18   Ht 5' 9 (1.753 m)   Wt 88.7 kg   SpO2 100%   BMI 28.89 kg/m   Physical Exam Vitals and nursing note reviewed.  Constitutional:      Appearance: He is well-developed.  HENT:     Head: Normocephalic and atraumatic.   Cardiovascular:     Rate and Rhythm: Normal rate and regular rhythm.  Pulmonary:     Effort: Pulmonary effort is normal. No respiratory distress.  Abdominal:     Palpations: Abdomen is soft.     Tenderness: There is no abdominal tenderness. There is no guarding or rebound.   Musculoskeletal:     Comments: No tenderness over left elbow, humerus, shoulder, forearm, wrist.  FROM intact over LUE.    Skin tear over the left forearm.   No tenderness over left hip.  Mild tenderness over the left knee with pain on flexion and extension.  Able to range left hip without pain.  2+ DP pulses.  Nonpitting edema to BLE.    Skin:    General: Skin is warm and dry.   Neurological:     Mental Status: He is alert and oriented to person, place, and time.   Psychiatric:        Behavior: Behavior normal.     (all labs ordered are listed, but only abnormal results are displayed) Labs Reviewed  BASIC METABOLIC PANEL WITH GFR - Abnormal; Notable for the following components:      Result Value   Glucose, Bld 106 (*)    BUN 38 (*)    Creatinine, Ser 1.44 (*)    GFR, Estimated 47 (*)    All other components within normal limits  CBC WITH DIFFERENTIAL/PLATELET - Abnormal; Notable for the following components:   RBC 4.07 (*)    Hemoglobin 12.5 (*)    HCT 38.4 (*)    All other components within normal  limits  CBG MONITORING, ED - Abnormal; Notable for the following components:   Glucose-Capillary 165 (*)    All other components within normal limits  URINALYSIS, ROUTINE W REFLEX MICROSCOPIC    EKG: None  Radiology: DG Shoulder Left Result Date: 12/23/2023 CLINICAL DATA:  Fall, left shoulder injury EXAM: LEFT SHOULDER - 2+ VIEW COMPARISON:  None Available. FINDINGS: Normal alignment. No acute  fracture or dislocation. Mild acromioclavicular degenerative arthritis. Mild glenohumeral degenerative arthritis, not optimally profiled on this examination. Visualized left hemithorax is unremarkable. IMPRESSION: 1. Mild degenerative arthritis. No acute fracture or dislocation. Electronically Signed   By: Dorethia Molt M.D.   On: 12/23/2023 01:54   CT Knee Left Wo Contrast Result Date: 12/23/2023 CLINICAL DATA:  Fall, left knee injury EXAM: CT OF THE LEFT KNEE WITHOUT CONTRAST TECHNIQUE: Multidetector CT imaging of the left knee was performed according to the standard protocol. Multiplanar CT image reconstructions were also generated. RADIATION DOSE REDUCTION: This exam was performed according to the departmental dose-optimization program which includes automated exposure control, adjustment of the mA and/or kV according to patient size and/or use of iterative reconstruction technique. COMPARISON:  None Available. FINDINGS: Bones/Joint/Cartilage There is a sagittally oriented, acute fracture of the lateral body of the patella with 1 mm distraction and anatomic alignment of the fracture fragments. No dislocation. No articular incongruity. The osseous structures are diffusely osteopenic. Visualized distal femur, proximal tibia, and proximal fibula are intact. Mild medial compartment degenerative arthritis with asymmetric joint space narrowing. Ligaments Suboptimally assessed by CT. Muscles and Tendons Mild fatty atrophy of the visualized musculature. Tendinous structures are intact. Soft tissues Moderate left  knee lipohemarthrosis. Mild periarticular subcutaneous edema. Moderate vascular calcification. IMPRESSION: 1. Acute sagittally oriented fracture of the lateral body of the patella with 1 mm distraction and anatomic alignment of the fracture fragments. 2. Moderate left knee lipohemarthrosis. 3. Mild medial compartment degenerative arthritis. Electronically Signed   By: Dorethia Molt M.D.   On: 12/23/2023 01:53   DG Knee Complete 4 Views Left Result Date: 12/22/2023 CLINICAL DATA:  Injury EXAM: LEFT KNEE - COMPLETE 4+ VIEW COMPARISON:  Left knee x-ray 04/13/2023 FINDINGS: No evidence of fracture, dislocation, or joint effusion. No evidence of arthropathy or other focal bone abnormality. Soft tissues are unremarkable. IMPRESSION: Negative. Electronically Signed   By: Greig Pique M.D.   On: 12/22/2023 22:27     Procedures   Medications Ordered in the ED  acetaminophen  (TYLENOL ) tablet 650 mg (650 mg Oral Given 12/23/23 0049)                                    Medical Decision Making Amount and/or Complexity of Data Reviewed Labs: ordered. Radiology: ordered.  Risk OTC drugs.   Patient here for evaluation of left knee pain following a fall, soreness to the left shoulder.  Plain films are negative for acute abnormality.  Given his pain with range of motion to the knee a CT scan was obtained, which demonstrates a patellar fracture.  He was placed in a knee immobilizer for comfort.  He is able to bear weight with the knee immobilizer in place.  He does have a walker available at home.  Given patient's fall labs were obtained.  BMP does have a mild elevation in creatinine when compared to baseline.  Discussed that he will need to follow-up with his PCP for recheck.  UA is not consistent with UTI.  CBC with very mild anemia, similar when compared to priors.  No reported bleeding.  Discussed that it is recommended that he decrease his blood pressure medication until he follows up with his PCP as his  blood pressures are on the low side of normal.  No evidence of sepsis.  Discussed need for orthopedics follow-up and weightbearing as tolerated.  Return precautions discussed.  Discussed  acetaminophen  as needed pain.     Final diagnoses:  Other closed fracture of left patella, initial encounter  Fall, initial encounter  Strain of left shoulder, initial encounter    ED Discharge Orders     None          Griselda Norris, MD 12/23/23 [phone]

## 2023-12-23 NOTE — Discharge Instructions (Signed)
 Please call Dr. Beverley Millman to follow up regarding your knee fracture.  Please call your family doctor to have your kidney function rechecked.

## 2024-02-06 ENCOUNTER — Ambulatory Visit: Payer: Self-pay

## 2024-02-06 ENCOUNTER — Other Ambulatory Visit: Payer: Self-pay | Admitting: Family Medicine

## 2024-02-06 ENCOUNTER — Telehealth: Payer: Self-pay

## 2024-02-06 DIAGNOSIS — G4762 Sleep related leg cramps: Secondary | ICD-10-CM

## 2024-02-06 MED ORDER — BACLOFEN 10 MG PO TABS: 10.0000 mg | ORAL_TABLET | Freq: Four times a day (QID) | ORAL | 3 refills | Status: AC | PRN

## 2024-02-06 MED ORDER — BACLOFEN 10 MG PO TABS: 10.0000 mg | ORAL_TABLET | Freq: Three times a day (TID) | ORAL | 3 refills | Status: DC | PRN

## 2024-02-06 MED ORDER — BACLOFEN 10 MG PO TABS: 10.0000 mg | ORAL_TABLET | Freq: Two times a day (BID) | ORAL | 3 refills | Status: DC | PRN

## 2024-02-06 NOTE — Telephone Encounter (Signed)
 I called patient and left message that Rx was sent to pharmacy.

## 2024-02-06 NOTE — Telephone Encounter (Signed)
 Called patient to inform that note with refill request was received. RX as refilled and sent to pharmacy and OV is due; left brief VM to schedule follow up appointment.

## 2024-02-06 NOTE — Telephone Encounter (Signed)
 Patient needs a replacement for the Baclofen  prescription sent in to [facility]. The pharmacy stated that they did not have Baclofen  and patient was advised to call his PCP for a replacement for this medication. This prescription also needs to be for a 90 day supply (180 tablets) with 3 refills  Patient transferred to CAL for assistance in scheduling patient for a follow up visit at this time.    FYI Only or Action Required?: Action required by provider: Replacement for Baclofen  sent in to [facility].  Patient was last seen in primary care on 08/26/2023 by Sebastian Beverley NOVAK, MD.  Called Nurse Triage reporting Medication Request.  Triage Disposition: Call PCP When Office is Open  Patient/caregiver understands and will follow disposition?: Yes            Reason for Disposition  [1] Caller requesting NON-URGENT health information AND [2] PCP's office is the best resource  Answer Assessment - Initial Assessment Questions Patient needs a replacement for the Baclofen  prescription sent in to [facility]. The pharmacy stated that they did not have Baclofen  and patient was advised to call his PCP for a replacement for this medication. This prescription also needs to be for a 90 day supply (180 tablets) with 3 refills  Patient transferred to CAL for assistance in scheduling patient for a follow up visit at this time.  Protocols used: Information Only Call - No Triage-A-AH

## 2024-02-06 NOTE — Addendum Note (Signed)
 Addended by: SEBASTIAN BEVERLEY NOVAK on: 02/06/2024 05:15 PM   Modules accepted: Orders

## 2024-02-09 NOTE — Telephone Encounter (Signed)
 Patient was called/notified and verbalized understanding.

## 2024-02-09 NOTE — Telephone Encounter (Signed)
 Noted

## 2024-02-23 ENCOUNTER — Ambulatory Visit (INDEPENDENT_AMBULATORY_CARE_PROVIDER_SITE_OTHER): Payer: Medicare Other

## 2024-02-23 DIAGNOSIS — Z0000 Encounter for general adult medical examination without abnormal findings: Secondary | ICD-10-CM | POA: Diagnosis not present

## 2024-02-23 NOTE — Progress Notes (Signed)
 Subjective:   Tim Morgan is a 89 y.o. who presents for a Medicare Wellness preventive visit.  As a reminder, Annual Wellness Visits don't include a physical exam, and some assessments may be limited, especially if this visit is performed virtually. We may recommend an in-person follow-up visit with your provider if needed.  Visit Complete: Virtual I connected with  Tim Morgan on 02/23/24 by a audio enabled telemedicine application and verified that I am speaking with the correct person using two identifiers.  Patient Location: Home  Provider Location: Office/Clinic  I discussed the limitations of evaluation and management by telemedicine. The patient expressed understanding and agreed to proceed.  Vital Signs: Because this visit was a virtual/telehealth visit, some criteria may be missing or patient reported. Any vitals not documented were not able to be obtained and vitals that have been documented are patient reported.  VideoError- Librarian, academic were attempted between this provider and patient, however failed, due to patient having technical difficulties OR patient did not have access to video capability.  We continued and completed visit with audio only.   Persons Participating in Visit: Patient.  AWV Questionnaire: No: Patient Medicare AWV questionnaire was not completed prior to this visit.  Cardiac Risk Factors include: advanced age (>57men, >57 women);diabetes mellitus;dyslipidemia;hypertension     Objective:    Today's Vitals   02/23/24 1444  PainSc: 4    There is no height or weight on file to calculate BMI.     02/23/2024    3:00 PM 12/22/2023    9:51 PM 05/07/2023    3:08 PM 02/22/2023    2:01 PM 02/17/2023    4:04 PM 11/11/2022    6:45 PM 09/01/2022    7:54 PM  Advanced Directives  Does Patient Have a Medical Advance Directive? No No Yes No No No No  Would patient like information on creating a medical advance directive? No -  Patient declined   No - Patient declined Yes (MAU/Ambulatory/Procedural Areas - Information given)  No - Patient declined    Current Medications (verified) Outpatient Encounter Medications as of 02/23/2024  Medication Sig   Ascorbic Acid (VITAMIN C) 1000 MG tablet Take 1,000 mg by mouth.   baclofen  (LIORESAL ) 10 MG tablet Take 1 tablet (10 mg total) by mouth 4 (four) times daily as needed (leg cramps).   CALCIUM  MAGNESIUM ZINC  PO Take 1 tablet by mouth 2 (two) times daily.   Chromium-Cinnamon (CINNAMON PLUS CHROMIUM PO) Take 2-3 capsules by mouth 2 (two) times daily.   Coenzyme Q10 (COQ10) 100 MG CAPS Take 1 capsule by mouth daily.   cyanocobalamin (VITAMIN B12) 1000 MCG tablet Take 1,000 mcg by mouth daily.   Ergocalciferol  50 MCG (2000 UT) CAPS Take 50 mcg by mouth once a week.   ferrous sulfate 325 (65 FE) MG tablet Take 1 tablet by mouth daily.   furosemide  (LASIX ) 40 MG tablet Take 1 tablet (40 mg total) by mouth daily.   insulin glargine (LANTUS) 100 UNIT/ML injection Inject 18 Units into the skin at bedtime. (Patient taking differently: Inject 13 Units into the skin at bedtime.)   losartan  (COZAAR ) 50 MG tablet TAKE 1 TABLET DAILY   metFORMIN (GLUMETZA) 500 MG (MOD) 24 hr tablet Take 500 mg by mouth 2 (two) times daily with a meal.   Multiple Vitamins-Minerals (CENTRUM SILVER MEN 50+) TABS Take 1 tablet by mouth daily.   Multiple Vitamins-Minerals (PRESERVISION AREDS) TABS Take 1 tablet by mouth daily.  omeprazole  (PRILOSEC) 20 MG capsule Take 1 capsule (20 mg total) by mouth daily.   rosuvastatin  (CRESTOR ) 40 MG tablet TAKE 1 TABLET AT BEDTIME   tamsulosin  (FLOMAX ) 0.4 MG CAPS capsule Take 1 capsule (0.4 mg total) by mouth daily.   TRULICITY 0.75 MG/0.5ML SOPN    acetaminophen  (TYLENOL ) 650 MG CR tablet Take 650 mg by mouth every 8 (eight) hours as needed for pain.   ciclopirox (PENLAC) 8 % solution    docusate sodium (COLACE) 100 MG capsule Take 100 mg by mouth at bedtime. (Patient  not taking: Reported on 02/23/2024)   famotidine  (PEPCID ) 20 MG tablet Take 20 mg by mouth at bedtime. Reported on 10/18/2015 (Patient not taking: Reported on 02/23/2024)   fluticasone  (FLONASE ) 50 MCG/ACT nasal spray Place 2 sprays into both nostrils daily. (Patient not taking: Reported on 02/23/2024)   insulin aspart (NOVOLOG) 100 UNIT/ML FlexPen Inject 6 Units into the skin 3 (three) times daily with meals. (Patient not taking: Reported on 02/23/2024)   ipratropium-albuterol (DUONEB) 0.5-2.5 (3) MG/3ML SOLN Reported on 10/18/2015 (Patient not taking: Reported on 02/23/2024)   magnesium oxide (MAG-OX) 400 MG tablet Take 400 mg by mouth daily. (Patient not taking: Reported on 02/23/2024)   sildenafil (VIAGRA) 100 MG tablet Take 100 mg by mouth daily as needed for erectile dysfunction. (Patient not taking: Reported on 02/23/2024)   Zinc  50 MG TABS Take by mouth. (Patient not taking: Reported on 02/23/2024)   No facility-administered encounter medications on file as of 02/23/2024.    Allergies (verified) Sitagliptin, Wound dressing adhesive, and Tape   History: Past Medical History:  Diagnosis Date   Acute bronchitis    COPD (chronic obstructive pulmonary disease) (HCC)    Diabetes mellitus    Edema of left lower extremity 07/03/2012   venous doppler-no evidence of thrombus or thrombophlebitis,left gsv valvular insufficency throughout the vein,left SSV appeared patent wiyh no venous insuffiency, left popliteal area questionable rupture Baker's cyst this was considered a abnormal doppler   GERD (gastroesophageal reflux disease)    History of back injury 07/01/1988   L 4 to , no surgery healed on own   Hypertension 12/06/2011   echo-EF 65-70%    Left ventricular hypertrophy    Melanoma (HCC)    Vitamin D  deficiency    Past Surgical History:  Procedure Laterality Date   ANKLE SURGERY Left 07/01/1988   APPENDECTOMY  age 67   BRAIN SURGERY  07/01/1993   concusion   CATARACT EXTRACTION Right  04/03/2021   CATARACT EXTRACTION Left 05/01/2021   HERNIA REPAIR     x 2   KNEE ARTHROSCOPY Left 11/18/2012   Procedure: LEFT ARTHROSCOPY KNEE WITH DEBRIDEMENT AND CHRONDROLPLASTY;  Surgeon: Dempsey LULLA Moan, MD;  Location: [facility];  Service: Orthopedics;  Laterality: Left;   KNEE SURGERY     NASAL SINUS SURGERY Bilateral    to open up ear tubes   History reviewed. No pertinent family history. 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  Tobacco Use   Smoking status: Never   Smokeless tobacco: Never  Vaping Use   Vaping status: Never Used  Substance and Sexual Activity   Alcohol use: No   Drug use: No   Sexual activity: Not on file  Other Topics Concern   Not on file  Social History Narrative   Not on file   Social Drivers of Health   Financial Resource Strain: Low Risk  (02/23/2024)   Overall Financial Resource Strain (CARDIA)    Difficulty of Paying Living Expenses: Not hard at all  Food Insecurity: No Food Insecurity (02/23/2024)   Hunger Vital Sign    Worried About Running Out of Food in the Last Year: Never true    Ran Out of Food in the Last Year: Never true  Transportation Needs: No Transportation Needs (02/23/2024)   PRAPARE - Administrator, Civil Service (Medical): No    Lack of Transportation (Non-Medical): No  Physical Activity: Inactive (02/23/2024)   Exercise Vital Sign    Days of Exercise per Week: 0 days    Minutes of Exercise per Session: 0 min  Stress: No Stress Concern Present (02/23/2024)   [employer] of Occupational Health - Occupational Stress Questionnaire    Feeling of Stress: Not at all  Social Connections: Moderately Integrated (02/23/2024)   Social Connection and Isolation Panel    Frequency of Communication with Friends and  Family: More than three times a week    Frequency of Social Gatherings with Friends and Family: More than three times a week    Attends Religious Services: More than 4 times per year    Active Member of [employer] or Organizations: No    Attends Engineer, structural: Never    Marital Status: Married    Tobacco Counseling Counseling given: Not Answered    Clinical Intake:  Pre-visit preparation completed: Yes  Pain : 0-10 Pain Score: 4  Pain Type: Chronic pain Pain Location: Knee Pain Orientation: Left Pain Descriptors / Indicators: Tightness Pain Onset: More than a month ago Pain Frequency: Intermittent     Nutritional Risks: None Diabetes: Yes CBG done?: No Did pt. bring in CBG monitor from home?: No  Lab Results  Component Value Date   HGBA1C 6.7 (H) 11/26/2022   HGBA1C 7.1 05/10/2021   HGBA1C 6.9 01/26/2021     How often do you need to have someone help you when you read instructions, pamphlets, or other written materials from your doctor or pharmacy?: 1 - Never  Interpreter Needed?: No  Information entered by :: NAllen LPN   Activities of Daily Living     02/23/2024    2:47 PM  In your present state of health, do you have any difficulty performing the following activities:  Hearing? 1  Comment has hearing aids  Vision? 0  Difficulty concentrating or making decisions? 1  Comment names sometimes  Walking or climbing stairs? 0  Dressing or bathing? 0  Doing errands, shopping? 0  Preparing Food and eating ? N  Using the Toilet? N  In the past six months, have you accidently leaked urine? N  Do you have problems with loss of bowel control? N  Managing your Medications? N  Managing your Finances? N  Housekeeping or managing your Housekeeping? N    Patient Care Team: Sebastian Beverley NOVAK, MD as PCP - General (Family Medicine) Cristy Bonner DASEN, MD as Consulting Physician (Orthopedic Surgery)  I have updated your Care Teams any recent Medical Services  you may have received from other providers in the past year.     Assessment:   This is a routine wellness examination for Amario.  Hearing/Vision screen Hearing Screening - Comments:: Has hearing aids that are maintained Vision Screening - Comments::  Regular eye exams, Dr. Alvia   Goals Addressed             This Visit's Progress    Patient Stated       02/23/2024, get knee well       Depression Screen     02/23/2024    3:03 PM 02/17/2023    4:06 PM 09/18/2022    9:40 AM  PHQ 2/9 Scores  PHQ - 2 Score 0 0 0  PHQ- 9 Score 0 0     Fall Risk     02/23/2024    3:02 PM 02/17/2023    4:05 PM  Fall Risk   Falls in the past year? 1 1  Comment not sure what happened   Number falls in past yr: 0 0  Injury with Fall? 1 1  Comment broke patella hurt right shoulder  Risk for fall due to : Medication side effect;Impaired balance/gait Impaired balance/gait;Impaired mobility;Medication side effect  Follow up Falls prevention discussed;Falls evaluation completed Falls prevention discussed;Falls evaluation completed    MEDICARE RISK AT HOME:  Medicare Risk at Home Any stairs in or around the home?: No If so, are there any without handrails?: No Home free of loose throw rugs in walkways, pet beds, electrical cords, etc?: Yes Adequate lighting in your home to reduce risk of falls?: Yes Life alert?: No Use of a cane, walker or w/c?: Yes Grab bars in the bathroom?: Yes Shower chair or bench in shower?: No Elevated toilet seat or a handicapped toilet?: Yes  TIMED UP AND GO:  Was the test performed?  No  Cognitive Function: 6CIT completed        02/23/2024    3:04 PM 02/17/2023    4:07 PM  6CIT Screen  What Year? 0 points 0 points  What month? 0 points 0 points  What time? 0 points 0 points  Count back from 20 0 points 0 points  Months in reverse 4 points 4 points  Repeat phrase 6 points 4 points  Total Score 10 points 8 points    Immunizations Immunization History   Administered Date(s) Administered   Influenza-Unspecified 07/01/2021   PFIZER(Purple Top)SARS-COV-2 Vaccination 12/23/2019, 01/13/2020, 07/11/2020   Tdap 09/27/2016   Zoster Recombinant(Shingrix) 07/22/2018, 01/28/2019    Screening Tests Health Maintenance  Topic Date Due   FOOT EXAM  Never done   COVID-19 Vaccine (4 - 2024-25 season) 03/02/2023   HEMOGLOBIN A1C  05/29/2023   INFLUENZA VACCINE  01/30/2024   Pneumococcal Vaccine: 50+ Years (1 of 2 - PCV) 08/25/2024 (Originally 03/27/1952)   OPHTHALMOLOGY EXAM  07/21/2024   Medicare Annual Wellness (AWV)  02/22/2025   DTaP/Tdap/Td (2 - Td or Tdap) 09/28/2026   Zoster Vaccines- Shingrix  Completed   HPV VACCINES  Aged Out   Meningococcal B Vaccine  Aged Out    Health Maintenance  Health Maintenance Due  Topic Date Due   FOOT EXAM  Never done   COVID-19 Vaccine (4 - 2024-25 season) 03/02/2023   HEMOGLOBIN A1C  05/29/2023   INFLUENZA VACCINE  01/30/2024   Health Maintenance Items Addressed: Diabetic Foot Exam recommended, Declines vaccines.  Additional Screening:  Vision Screening: Recommended annual ophthalmology exams for early detection of glaucoma and other disorders of the eye. Would you like a referral to an eye doctor? No    Dental Screening: Recommended annual dental exams for proper oral hygiene  Community Resource Referral / Chronic Care Management: CRR required this visit?  No   CCM required  this visit?  No   Plan:    I have personally reviewed and noted the following in the patient's chart:   Medical and social history Use of alcohol, tobacco or illicit drugs  Current medications and supplements including opioid prescriptions. Patient is not currently taking opioid prescriptions. Functional ability and status Nutritional status Physical activity Advanced directives List of other physicians Hospitalizations, surgeries, and ER visits in previous 12 months Vitals Screenings to include cognitive,  depression, and falls Referrals and appointments  In addition, I have reviewed and discussed with patient certain preventive protocols, quality metrics, and best practice recommendations. A written personalized care plan for preventive services as well as general preventive health recommendations were provided to patient.   Ardella FORBES Dawn, LPN   [id number]   After Visit Summary: (MyChart) Due to this being a telephonic visit, the after visit summary with patients personalized plan was offered to patient via MyChart   Notes: Nothing significant to report at this time.

## 2024-02-23 NOTE — Patient Instructions (Signed)
 Mr. Tim Morgan , Thank you for taking time out of your busy schedule to complete your Annual Wellness Visit with me. I enjoyed our conversation and look forward to speaking with you again next year. I, as well as your care team,  appreciate your ongoing commitment to your health goals. Please review the following plan we discussed and let me know if I can assist you in the future. Your Game plan/ To Do List    Referrals: If you haven't heard from the office you've been referred to, please reach out to them at the phone provided.   Follow up Visits: We will see or speak with you next year for your Next Medicare AWV with our clinical staff Have you seen your provider in the last 6 months (3 months if uncontrolled diabetes)? No  Clinician Recommendations:  Aim for 30 minutes of exercise or brisk walking, 6-8 glasses of water, and 5 servings of fruits and vegetables each day.       This is a list of the screenings recommended for you:  Health Maintenance  Topic Date Due   Complete foot exam   Never done   COVID-19 Vaccine (4 - 2024-25 season) 03/02/2023   Hemoglobin A1C  05/29/2023   Flu Shot  01/30/2024   Pneumococcal Vaccine for age over 52 (1 of 2 - PCV) 08/25/2024*   Eye exam for diabetics  07/21/2024   Medicare Annual Wellness Visit  02/22/2025   DTaP/Tdap/Td vaccine (2 - Td or Tdap) 09/28/2026   Zoster (Shingles) Vaccine  Completed   HPV Vaccine  Aged Out   Meningitis B Vaccine  Aged Out  *Topic was postponed. The date shown is not the original due date.    Advanced directives: (Declined) Advance directive discussed with you today. Even though you declined this today, please call our office should you change your mind, and we can give you the proper paperwork for you to fill out. Advance Care Planning is important because it:  [x]  Makes sure you receive the medical care that is consistent with your values, goals, and preferences  [x]  It provides guidance to your family and loved ones  and reduces their decisional burden about whether or not they are making the right decisions based on your wishes.  Follow the link provided in your after visit summary or read over the paperwork we have mailed to you to help you started getting your Advance Directives in place. If you need assistance in completing these, please reach out to us  so that we can help you!  See attachments for Preventive Care and Fall Prevention Tips.

## 2024-02-24 ENCOUNTER — Encounter: Payer: Self-pay | Admitting: Family Medicine

## 2024-02-24 ENCOUNTER — Ambulatory Visit (INDEPENDENT_AMBULATORY_CARE_PROVIDER_SITE_OTHER): Admitting: Family Medicine

## 2024-02-24 VITALS — BP 117/60 | HR 67 | Temp 97.0°F | Resp 18 | Wt 198.6 lb

## 2024-02-24 DIAGNOSIS — N401 Enlarged prostate with lower urinary tract symptoms: Secondary | ICD-10-CM | POA: Diagnosis not present

## 2024-02-24 DIAGNOSIS — R351 Nocturia: Secondary | ICD-10-CM

## 2024-02-24 DIAGNOSIS — Z131 Encounter for screening for diabetes mellitus: Secondary | ICD-10-CM | POA: Diagnosis not present

## 2024-02-24 DIAGNOSIS — E785 Hyperlipidemia, unspecified: Secondary | ICD-10-CM

## 2024-02-24 DIAGNOSIS — M831 Senile osteomalacia: Secondary | ICD-10-CM

## 2024-02-24 DIAGNOSIS — Z8589 Personal history of malignant neoplasm of other organs and systems: Secondary | ICD-10-CM | POA: Diagnosis not present

## 2024-02-24 MED ORDER — TAMSULOSIN HCL 0.4 MG PO CAPS: 0.4000 mg | ORAL_CAPSULE | Freq: Every day | ORAL | 1 refills | Status: AC

## 2024-02-24 NOTE — Patient Instructions (Signed)
  VISIT SUMMARY: Today, you came in for a follow-up on your blood pressure. We discussed your current medications and overall health, including your hypertension, diabetes, hyperlipidemia, benign prostatic hyperplasia, left knee pain, right forearm abrasion, and a cyst on your left lower extremity. We also reviewed your recent physical therapy progress and your history of skin cancer.  YOUR PLAN: -ESSENTIAL HYPERTENSION: Your blood pressure is well controlled at 117/60 mmHg with your current medication, losartan  50 mg daily. We discussed that your balance issues are likely due to age-related changes rather than your blood pressure medication. Please continue taking losartan  as prescribed.  -TYPE 2 DIABETES MELLITUS: Your diabetes is well controlled with a recent hemoglobin A1c of 6.3%. No changes to your current management are needed.  -HYPERLIPIDEMIA: Hyperlipidemia means you have high levels of fats in your blood. We need to recheck your lipid levels since your last test was in 2024. A fasting lipid panel has been ordered.  -BENIGN PROSTATIC HYPERPLASIA WITH LOWER URINARY TRACT SYMPTOMS: Benign prostatic hyperplasia is an enlarged prostate gland that can cause urinary symptoms. You are managing this condition with tamsulosin  0.4 mg daily. Please continue taking tamsulosin  as prescribed.  -HEALING NONDISPLACED FRACTURE OF LEFT PATELLA WITH LEFT KNEE PAIN: You have a healing fracture in your left knee from a fall in June 2025. You are making progress with physical therapy, and the fracture is healing from the top. Please continue with your physical therapy sessions.  -EDEMA OF LOWER EXTREMITIES: Edema is swelling caused by fluid buildup. Your leg swelling has improved with physical therapy and leg elevation. You are not currently using compression stockings or Lasix . If the swelling worsens, consider resuming compression stockings. For now, you can hold off on taking Lasix .  -HEALING RIGHT FOREARM  ABRASION: You have a healing abrasion on your right forearm from physical therapy. It occasionally burns but is healing well. Please continue applying emollients like Vaseline or Aquaphor, keep the area covered and clean, and monitor for signs of infection.  -SYMPTOMATIC POST-SURGICAL CYST OF LEFT LOWER EXTREMITY: You have a painful cyst on your left lower leg, likely related to previous skin cancer surgery. The pain worsens with prolonged sitting and improves with leg elevation. We will refer you to dermatology for further evaluation after your spouse's surgery.  -HISTORY OF SKIN CANCER, LEFT LOWER EXTREMITY: You have a history of skin cancer that was previously removed from your left lower leg. No new issues were noted today.  INSTRUCTIONS: Please schedule a fasting lab appointment for your lipid panel. We will also plan a dermatology consult for your cyst evaluation after your spouse's surgery.                      Contains text generated by [employer].                                 Contains text generated by [employer].

## 2024-02-24 NOTE — Progress Notes (Signed)
 Assessment & Plan   Assessment/Plan:    Assessment & Plan Essential hypertension Hypertension is well controlled with current medication regimen. Blood pressure today is 117/60 mmHg. No symptoms of dizziness, chest pain, or shortness of breath reported. Balance issues are attributed to age-related changes rather than blood pressure medication. Discussed the role of losartan  in managing blood pressure and the importance of maintaining tight control due to diabetes history. - Continue losartan  50 mg daily  Type 2 diabetes mellitus Diabetes is well controlled with a recent hemoglobin A1c of 6.3%.  Hyperlipidemia Hyperlipidemia management is ongoing. Last lipid panel was in 2024, and a recheck is recommended. - Order fasting lipid panel  Benign prostatic hyperplasia with lower urinary tract symptoms BPH is managed with tamsulosin  0.4 mg daily. - Continue tamsulosin  0.4 mg daily  Healing nondisplaced fracture of left patella with left knee pain Sustained a vertical fracture of the left patella on December 22, 2023. Healing is ongoing with incomplete union at the bottom of the fracture. Undergoing physical therapy and reports improvement. Orthopedic follow-up indicates healing from the top, but the bottom still requires time. - Continue physical therapy  Edema of lower extremities Edema has improved with increased physical activity and leg elevation. Not currently using compression stockings or Lasix . Discussed the role of physical therapy and leg elevation in managing edema. - Consider resuming compression stockings if edema worsens - Hold Lasix  for now  Healing right forearm abrasion Right forearm abrasion is healing well. No signs of infection or drainage. Reports occasional burning sensation. Advised on wound care and signs of infection to monitor. 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 Pain is exacerbated by prolonged sitting and relieved by leg elevation. Discussed potential causes including post-surgical changes and peripheral vascular issues. - Refer to dermatology for evaluation of cyst - Plan dermatology consult after spouse's recovery from surgery  History of skin cancer, left lower extremity Previously excised.  Follow-Up - Schedule fasting lab appointment for lipid panel - Plan dermatology consult for cyst evaluation after spouse's surgery      Medications Discontinued During This Encounter  Medication Reason   docusate sodium (COLACE) 100 MG capsule    famotidine  (PEPCID ) 20 MG tablet    fluticasone  (FLONASE ) 50 MCG/ACT nasal spray    insulin aspart (NOVOLOG) 100 UNIT/ML FlexPen    ipratropium-albuterol (DUONEB) 0.5-2.5 (3) MG/3ML SOLN    magnesium oxide (MAG-OX) 400 MG tablet    sildenafil (VIAGRA) 100 MG tablet    Zinc  50 MG TABS    tamsulosin  (FLOMAX ) 0.4 MG CAPS capsule Reorder    Return in about 6 months (around 08/26/2024) for BP.        Subjective:   Encounter date: 02/24/2024  Tim Morgan is a 89 y.o. male who has Acute medial meniscal tear; Chronic sinusitis/chronic cough ; Mild persistent asthma in adult without complication; Essential hypertension; Upper airway cough syndrome; Venous stasis of both lower extremities; Cellulitis of face; Type 2 diabetes mellitus without complication, with long-term current use of insulin (HCC); H/O local excision of skin lesion on right lower leg; Rash; Vitamin D  deficiency; Cardiomyopathy (HCC); RBBB; Acute cough; Chest wall pain; Class 1 obesity with serious comorbidity in adult; Hyperlipidemia; Acute pain of right shoulder;  Fall; Preop general physical exam; Neck pain; Left hip pain; Blurry vision, bilateral; Acute  pain of left knee; Viral URI with cough; and Nocturnal leg cramps on their problem list..   He  has a past medical history of Acute bronchitis, COPD (chronic obstructive pulmonary disease) (HCC), Diabetes mellitus, Edema of left lower extremity (07/03/2012), GERD (gastroesophageal reflux disease), History of back injury (07/01/1988), Hypertension (12/06/2011), Left ventricular hypertrophy, Melanoma (HCC), and Vitamin D  deficiency.[name]   He presents with chief complaint of Hypertension (6 month follow up ) and left knee pain (ED visit on 12/22/2023 due to fall. Pt is currently doing physical therapy ) .   Discussed the use of AI scribe software for clinical note transcription with the patient, who gave verbal consent to proceed.  History of Present Illness Tim Morgan is an 89 year old male with hypertension and diabetes who presents for blood pressure follow-up.  His blood pressure is currently well controlled at 117/60. He is taking losartan  50 mg daily for hypertension. He has a history of blood pressure readings as low as 100/60 and is concerned that his medication may be too strong. He experiences balance issues and uses a cane for support. No dizziness, chest pain, or shortness of breath.  He has hyperlipidemia and takes 40 mg daily for it. He has not had his lipid levels checked since 2024 and is due for a fasting lipid panel.  He has benign prostatic hyperplasia and takes tamsulosin  0.4 mg daily.  He follows with endocrinology for diabetes, which is well controlled with a recent hemoglobin A1c of 6.3.  He reports left knee pain following a fall on December 22, 2023, resulting in a closed vertical fracture of the patella. He has been undergoing physical therapy for over a month and is under the care of orthopedics. He wears a brace and continues therapy.  He experienced a 'sheet burn' on his right arm during physical therapy, which is healing but occasionally burns when touched. He applies  emollients to aid healing.  He has a history of skin cancer, previously removed from the back of his leg. He also reports a cyst in the same area, which was drained but causes pain when sitting for extended periods. He has had multiple sebaceous cysts removed in the past.  He takes Lasix  40 mg monthly for leg swelling, which has improved since he began physical therapy and elevating his legs. He has not been wearing compression socks since his knee injury.     ROS  Past Surgical History:  Procedure Laterality Date   ANKLE SURGERY Left 07/01/1988   APPENDECTOMY  age 46   BRAIN SURGERY  07/01/1993   concusion   CATARACT EXTRACTION Right 04/03/2021   CATARACT EXTRACTION Left 05/01/2021   HERNIA REPAIR     x 2   KNEE ARTHROSCOPY Left 11/18/2012   Procedure: LEFT ARTHROSCOPY KNEE WITH DEBRIDEMENT AND CHRONDROLPLASTY;  Surgeon: Dempsey LULLA Moan, MD;  Location: [facility];  Service: Orthopedics;  Laterality: Left;   KNEE SURGERY     NASAL SINUS SURGERY Bilateral    to open up ear tubes    Outpatient Medications Prior to Visit  Medication Sig Dispense Refill   acetaminophen  (TYLENOL ) 650 MG CR tablet Take 650 mg by mouth every 8 (eight) hours as needed for pain.     Ascorbic Acid (VITAMIN C) 1000 MG tablet Take 1,000 mg by mouth.     baclofen  (LIORESAL ) 10 MG tablet Take 1 tablet (10 mg total)  by mouth 4 (four) times daily as needed (leg cramps). 360 tablet 3   CALCIUM  MAGNESIUM ZINC  PO Take 1 tablet by mouth 2 (two) times daily.     Chromium-Cinnamon (CINNAMON PLUS CHROMIUM PO) Take 2-3 capsules by mouth 2 (two) times daily.     ciclopirox (PENLAC) 8 % solution      Coenzyme Q10 (COQ10) 100 MG CAPS Take 1 capsule by mouth daily.     cyanocobalamin (VITAMIN B12) 1000 MCG tablet Take 1,000 mcg by mouth daily.     Ergocalciferol  50 MCG (2000 UT) CAPS Take 50 mcg by mouth once a week.     ferrous sulfate 325 (65 FE) MG tablet Take 1 tablet by mouth daily.     furosemide  (LASIX ) 40 MG tablet  Take 1 tablet (40 mg total) by mouth daily. 90 tablet 3   insulin glargine (LANTUS) 100 UNIT/ML injection Inject 18 Units into the skin at bedtime.     losartan  (COZAAR ) 50 MG tablet TAKE 1 TABLET DAILY 90 tablet 3   metFORMIN (GLUMETZA) 500 MG (MOD) 24 hr tablet Take 500 mg by mouth 2 (two) times daily with a meal.     Multiple Vitamins-Minerals (CENTRUM SILVER MEN 50+) TABS Take 1 tablet by mouth daily.     Multiple Vitamins-Minerals (PRESERVISION AREDS) TABS Take 1 tablet by mouth daily.     omeprazole  (PRILOSEC) 20 MG capsule Take 1 capsule (20 mg total) by mouth daily. 90 capsule 1   rosuvastatin  (CRESTOR ) 40 MG tablet TAKE 1 TABLET AT BEDTIME 90 tablet 3   TRULICITY 0.75 MG/0.5ML SOPN      tamsulosin  (FLOMAX ) 0.4 MG CAPS capsule Take 1 capsule (0.4 mg total) by mouth daily. 90 capsule 1   docusate sodium (COLACE) 100 MG capsule Take 100 mg by mouth at bedtime. (Patient not taking: Reported on 02/24/2024)     famotidine  (PEPCID ) 20 MG tablet Take 20 mg by mouth at bedtime. Reported on 10/18/2015 (Patient not taking: Reported on 02/24/2024)  2   fluticasone  (FLONASE ) 50 MCG/ACT nasal spray Place 2 sprays into both nostrils daily. (Patient not taking: Reported on 02/24/2024) 16 g 0   insulin aspart (NOVOLOG) 100 UNIT/ML FlexPen Inject 6 Units into the skin 3 (three) times daily with meals. (Patient not taking: Reported on 02/24/2024)     ipratropium-albuterol (DUONEB) 0.5-2.5 (3) MG/3ML SOLN Reported on 10/18/2015 (Patient not taking: Reported on 02/24/2024)  3   magnesium oxide (MAG-OX) 400 MG tablet Take 400 mg by mouth daily. (Patient not taking: Reported on 02/24/2024)     sildenafil (VIAGRA) 100 MG tablet Take 100 mg by mouth daily as needed for erectile dysfunction. (Patient not taking: Reported on 02/24/2024)     Zinc  50 MG TABS Take by mouth. (Patient not taking: Reported on 02/24/2024)     No facility-administered medications prior to visit.    History reviewed. No pertinent family  history.  Social History   Socioeconomic History   Marital status: Married    Spouse name: Not on file   Number of children: 0   Years of education: Not on file   Highest education level: Not on file  Occupational History   Occupation: Retired Restaurant manager, fast food  Tobacco Use   Smoking status: Never   Smokeless tobacco: Never  Vaping Use   Vaping status: Never Used  Substance and Sexual Activity   Alcohol use: No   Drug use: No   Sexual activity: Not Currently    Birth control/protection: None  Other Topics Concern   Not on file  Social History Narrative   Not on file   Social Drivers of Health   Financial Resource Strain: Low Risk  (02/23/2024)   Overall Financial Resource Strain (CARDIA)    Difficulty of Paying Living Expenses: Not hard at all  Food Insecurity: No Food Insecurity (02/23/2024)   Hunger Vital Sign    Worried About Running Out of Food in the Last Year: Never true    Ran Out of Food in the Last Year: Never true  Transportation Needs: No Transportation Needs (02/23/2024)   PRAPARE - Administrator, Civil Service (Medical): No    Lack of Transportation (Non-Medical): No  Physical Activity: Inactive (02/23/2024)   Exercise Vital Sign    Days of Exercise per Week: 0 days    Minutes of Exercise per Session: 0 min  Stress: No Stress Concern Present (02/23/2024)   [employer] of Occupational Health - Occupational Stress Questionnaire    Feeling of Stress: Not at all  Social Connections: Moderately Integrated (02/23/2024)   Social Connection and Isolation Panel    Frequency of Communication with Friends and Family: More than three times a week    Frequency of Social Gatherings with Friends and Family: More than three times a week    Attends Religious Services: More than 4 times per year    Active Member of [employer] or Organizations: No    Attends Banker Meetings: Never    Marital Status: Married  Catering manager Violence: Not At Risk  (02/23/2024)   Humiliation, Afraid, Rape, and Kick questionnaire    Fear of Current or Ex-Partner: No    Emotionally Abused: No    Physically Abused: No    Sexually Abused: No                                                                                                  Objective:  Physical Exam: BP 117/60 (BP Location: Left Arm, Patient Position: Sitting, Cuff Size: Normal)   Pulse 67   Temp (!) 97 F (36.1 C) (Temporal)   Resp 18   Wt 198 lb 9.6 oz (90.1 kg)   SpO2 97%   BMI 29.33 kg/m    Physical Exam  GENERAL: Alert, cooperative, well developed, no acute distress. HEENT: Normocephalic, normal oropharynx, moist mucous membranes. CHEST: Clear to auscultation bilaterally. No wheezes, rhonchi, or crackles. CARDIOVASCULAR: Normal heart rate and rhythm. S1 and S2 normal without murmurs. ABDOMEN: Soft, non-tender, non-distended, without organomegaly. Normal bowel sounds. EXTREMITIES: No cyanosis or edema. NEUROLOGICAL: Cranial nerves grossly intact. Moves all extremities without gross motor or sensory deficit. SKIN: Skin without signs of infection or drainage.   Physical Exam  DG Shoulder Left Result Date: 12/23/2023 CLINICAL DATA:  Fall, left shoulder injury EXAM: LEFT SHOULDER - 2+ VIEW COMPARISON:  None Available. FINDINGS: Normal alignment. No acute fracture or dislocation. Mild acromioclavicular degenerative arthritis. Mild glenohumeral degenerative arthritis, not optimally profiled on this examination. Visualized left hemithorax is unremarkable. IMPRESSION: 1. Mild degenerative arthritis. No acute fracture or dislocation. Electronically Signed   By:  Dorethia Molt M.D.   On: 12/23/2023 01:54   CT Knee Left Wo Contrast Result Date: 12/23/2023 CLINICAL DATA:  Fall, left knee injury EXAM: CT OF THE LEFT KNEE WITHOUT CONTRAST TECHNIQUE: Multidetector CT imaging of the left knee was performed according to the standard protocol. Multiplanar CT image reconstructions were also  generated. RADIATION DOSE REDUCTION: This exam was performed according to the departmental dose-optimization program which includes automated exposure control, adjustment of the mA and/or kV according to patient size and/or use of iterative reconstruction technique. COMPARISON:  None Available. FINDINGS: Bones/Joint/Cartilage There is a sagittally oriented, acute fracture of the lateral body of the patella with 1 mm distraction and anatomic alignment of the fracture fragments. No dislocation. No articular incongruity. The osseous structures are diffusely osteopenic. Visualized distal femur, proximal tibia, and proximal fibula are intact. Mild medial compartment degenerative arthritis with asymmetric joint space narrowing. Ligaments Suboptimally assessed by CT. Muscles and Tendons Mild fatty atrophy of the visualized musculature. Tendinous structures are intact. Soft tissues Moderate left knee lipohemarthrosis. Mild periarticular subcutaneous edema. Moderate vascular calcification. IMPRESSION: 1. Acute sagittally oriented fracture of the lateral body of the patella with 1 mm distraction and anatomic alignment of the fracture fragments. 2. Moderate left knee lipohemarthrosis. 3. Mild medial compartment degenerative arthritis. Electronically Signed   By: Dorethia Molt M.D.   On: 12/23/2023 01:53   DG Knee Complete 4 Views Left Result Date: 12/22/2023 CLINICAL DATA:  Injury EXAM: LEFT KNEE - COMPLETE 4+ VIEW COMPARISON:  Left knee x-ray 04/13/2023 FINDINGS: No evidence of fracture, dislocation, or joint effusion. No evidence of arthropathy or other focal bone abnormality. Soft tissues are unremarkable. IMPRESSION: Negative. Electronically Signed   By: Greig Pique M.D.   On: 12/22/2023 22:27    Recent Results (from the past 2160 hours)  CBG monitoring, ED     Status: Abnormal   Collection Time: 12/22/23  9:49 PM  Result Value Ref Range   Glucose-Capillary 165 (H) 70 - 99 mg/dL    Comment: Glucose reference  range applies only to samples taken after fasting for at least 8 hours.  Basic metabolic panel     Status: Abnormal   Collection Time: 12/23/23 12:55 AM  Result Value Ref Range   Sodium 140 135 - 145 mmol/L   Potassium 4.1 3.5 - 5.1 mmol/L   Chloride 104 98 - 111 mmol/L   CO2 24 22 - 32 mmol/L   Glucose, Bld 106 (H) 70 - 99 mg/dL    Comment: Glucose reference range applies only to samples taken after fasting for at least 8 hours.   BUN 38 (H) 8 - 23 mg/dL   Creatinine, Ser 8.55 (H) 0.61 - 1.24 mg/dL   Calcium  9.1 8.9 - 10.3 mg/dL   GFR, Estimated 47 (L) >60 mL/min    Comment: (NOTE) Calculated using the CKD-EPI Creatinine Equation (2021)    Anion gap 12 5 - 15    Comment: Performed at [facility], [address] [place], [place] [address]  CBC with Differential     Status: Abnormal   Collection Time: 12/23/23 12:55 AM  Result Value Ref Range   WBC 8.7 4.0 - 10.5 K/uL   RBC 4.07 (L) 4.22 - 5.81 MIL/uL   Hemoglobin 12.5 (L) 13.0 - 17.0 g/dL   HCT 61.5 (L) 60.9 - 47.9 %   MCV 94.3 80.0 - 100.0 fL   MCH 30.7 26.0 - 34.0 pg   MCHC 32.6 30.0 - 36.0 g/dL  RDW 13.1 11.5 - 15.5 %   Platelets 221 150 - 400 K/uL   nRBC 0.0 0.0 - 0.2 %   Neutrophils Relative % 61 %   Neutro Abs 5.3 1.7 - 7.7 K/uL   Lymphocytes Relative 25 %   Lymphs Abs 2.2 0.7 - 4.0 K/uL   Monocytes Relative 8 %   Monocytes Absolute 0.7 0.1 - 1.0 K/uL   Eosinophils Relative 5 %   Eosinophils Absolute 0.4 0.0 - 0.5 K/uL   Basophils Relative 1 %   Basophils Absolute 0.1 0.0 - 0.1 K/uL   Immature Granulocytes 0 %   Abs Immature Granulocytes 0.01 0.00 - 0.07 K/uL    Comment: Performed at [facility], [address] [place], [place] [address]  Urinalysis, Routine w reflex microscopic -Urine, Clean Catch     Status: None   Collection Time: 12/23/23  2:44 AM  Result Value Ref Range   Color, Urine YELLOW YELLOW   APPearance CLEAR CLEAR   Specific Gravity, Urine 1.020 1.005 - 1.030   pH 6.0  5.0 - 8.0   Glucose, UA NEGATIVE NEGATIVE mg/dL   Hgb urine dipstick NEGATIVE NEGATIVE   Bilirubin Urine NEGATIVE NEGATIVE   Ketones, ur NEGATIVE NEGATIVE mg/dL   Protein, ur NEGATIVE NEGATIVE mg/dL   Nitrite NEGATIVE NEGATIVE   Leukocytes,Ua NEGATIVE NEGATIVE    Comment: Microscopic not done on urines with negative protein, blood, leukocytes, nitrite, or glucose < 500 mg/dL. Performed at [facility], [address] [place], [place] [address]         Beverley Adine Hummer, MD, MS

## 2024-02-25 ENCOUNTER — Other Ambulatory Visit (INDEPENDENT_AMBULATORY_CARE_PROVIDER_SITE_OTHER)

## 2024-02-25 DIAGNOSIS — N401 Enlarged prostate with lower urinary tract symptoms: Secondary | ICD-10-CM | POA: Diagnosis not present

## 2024-02-25 DIAGNOSIS — Z131 Encounter for screening for diabetes mellitus: Secondary | ICD-10-CM

## 2024-02-25 DIAGNOSIS — E785 Hyperlipidemia, unspecified: Secondary | ICD-10-CM | POA: Diagnosis not present

## 2024-02-25 DIAGNOSIS — R351 Nocturia: Secondary | ICD-10-CM

## 2024-02-25 DIAGNOSIS — M831 Senile osteomalacia: Secondary | ICD-10-CM

## 2024-02-25 LAB — CBC WITH DIFFERENTIAL/PLATELET
Basophils Absolute: 0 K/uL (ref 0.0–0.1)
Basophils Relative: 0.9 % (ref 0.0–3.0)
Eosinophils Absolute: 0.3 K/uL (ref 0.0–0.7)
Eosinophils Relative: 6.3 % — ABNORMAL HIGH (ref 0.0–5.0)
HCT: 38.8 % — ABNORMAL LOW (ref 39.0–52.0)
Hemoglobin: 12.8 g/dL — ABNORMAL LOW (ref 13.0–17.0)
Lymphocytes Relative: 28 % (ref 12.0–46.0)
Lymphs Abs: 1.4 K/uL (ref 0.7–4.0)
MCHC: 33 g/dL (ref 30.0–36.0)
MCV: 92 fl (ref 78.0–100.0)
Monocytes Absolute: 0.4 K/uL (ref 0.1–1.0)
Monocytes Relative: 7.2 % (ref 3.0–12.0)
Neutro Abs: 2.8 K/uL (ref 1.4–7.7)
Neutrophils Relative %: 57.6 % (ref 43.0–77.0)
Platelets: 235 K/uL (ref 150.0–400.0)
RBC: 4.22 Mil/uL (ref 4.22–5.81)
RDW: 13.5 % (ref 11.5–15.5)
WBC: 4.9 K/uL (ref 4.0–10.5)

## 2024-02-25 LAB — COMPREHENSIVE METABOLIC PANEL WITH GFR
ALT: 16 U/L (ref 0–53)
AST: 18 U/L (ref 0–37)
Albumin: 4 g/dL (ref 3.5–5.2)
Alkaline Phosphatase: 44 U/L (ref 39–117)
BUN: 30 mg/dL — ABNORMAL HIGH (ref 6–23)
CO2: 28 meq/L (ref 19–32)
Calcium: 8.9 mg/dL (ref 8.4–10.5)
Chloride: 102 meq/L (ref 96–112)
Creatinine, Ser: 1.17 mg/dL (ref 0.40–1.50)
GFR: 55.5 mL/min — ABNORMAL LOW (ref >60.00)
Glucose, Bld: 96 mg/dL (ref 70–99)
Potassium: 4.5 meq/L (ref 3.5–5.1)
Sodium: 139 meq/L (ref 135–145)
Total Bilirubin: 0.6 mg/dL (ref 0.2–1.2)
Total Protein: 6.3 g/dL (ref 6.0–8.3)

## 2024-02-25 LAB — LIPID PANEL
Cholesterol: 132 mg/dL (ref 0–200)
HDL: 48.5 mg/dL (ref >39.00)
LDL Cholesterol: 59 mg/dL (ref 0–99)
NonHDL: 83.15
Total CHOL/HDL Ratio: 3
Triglycerides: 120 mg/dL (ref 0.0–149.0)
VLDL: 24 mg/dL (ref 0.0–40.0)

## 2024-02-25 LAB — MICROALBUMIN / CREATININE URINE RATIO
Creatinine,U: 162.6 mg/dL
Microalb Creat Ratio: 4.9 mg/g (ref 0.0–30.0)
Microalb, Ur: 0.8 mg/dL (ref 0.0–1.9)

## 2024-02-25 LAB — PSA: PSA: 0.06 ng/mL — ABNORMAL LOW (ref 0.10–4.00)

## 2024-02-25 LAB — TSH: TSH: 1.31 u[IU]/mL (ref 0.35–5.50)

## 2024-02-25 LAB — HEMOGLOBIN A1C: Hgb A1c MFr Bld: 6.5 % (ref 4.6–6.5)

## 2024-03-01 LAB — URINALYSIS W MICROSCOPIC + REFLEX CULTURE
Bacteria, UA: NONE SEEN /HPF
Bilirubin Urine: NEGATIVE
Glucose, UA: NEGATIVE
Hgb urine dipstick: NEGATIVE
Hyaline Cast: NONE SEEN /LPF
Nitrites, Initial: NEGATIVE
RBC / HPF: NONE SEEN /HPF (ref 0–2)
Specific Gravity, Urine: 1.021 (ref 1.001–1.035)
WBC, UA: NONE SEEN /HPF (ref 0–5)
pH: 5.5 (ref 5.0–8.0)

## 2024-03-01 LAB — URINE CULTURE
MICRO NUMBER:: 16895117
Result:: NO GROWTH
SPECIMEN QUALITY:: ADEQUATE

## 2024-03-01 LAB — CULTURE INDICATED

## 2024-03-01 LAB — VITAMIN D 1,25 DIHYDROXY
Vitamin D 1, 25 (OH)2 Total: 17 pg/mL — ABNORMAL LOW (ref 18–72)
Vitamin D2 1, 25 (OH)2: <8 pg/mL
Vitamin D3 1, 25 (OH)2: 17 pg/mL

## 2024-03-02 ENCOUNTER — Ambulatory Visit: Payer: Self-pay | Admitting: Family Medicine

## 2024-03-02 DIAGNOSIS — E559 Vitamin D deficiency, unspecified: Secondary | ICD-10-CM

## 2024-03-02 MED ORDER — VITAMIN D (ERGOCALCIFEROL) 1.25 MG (50000 UNIT) PO CAPS: 50000.0000 [IU] | ORAL_CAPSULE | ORAL | 0 refills | Status: DC

## 2024-03-11 ENCOUNTER — Encounter (INDEPENDENT_AMBULATORY_CARE_PROVIDER_SITE_OTHER): Payer: Medicare Other | Admitting: Ophthalmology

## 2024-04-02 ENCOUNTER — Ambulatory Visit: Admitting: Family Medicine

## 2024-04-02 ENCOUNTER — Telehealth: Payer: Self-pay | Admitting: Family Medicine

## 2024-04-02 ENCOUNTER — Other Ambulatory Visit (INDEPENDENT_AMBULATORY_CARE_PROVIDER_SITE_OTHER)

## 2024-04-02 DIAGNOSIS — E559 Vitamin D deficiency, unspecified: Secondary | ICD-10-CM | POA: Diagnosis not present

## 2024-04-02 NOTE — Telephone Encounter (Signed)
 Prescription Request  04/02/2024  LOV: 02/24/2024  What is the name of the medication or equipment? Vitamin D , Ergocalciferol , (DRISDOL ) 1.25 MG (50000 UNIT) CAPS capsule [[id number]]   Have you contacted your pharmacy to request a refill? Yes, pt had an appt for today 04/02/24, prov out of office, so he had to reschedule  Which pharmacy would you like this sent to?  [facility] - [place], [place] - [address] [place] [address] Phone: [phone] Fax: [phone]    Patient notified that their request is being sent to the clinical staff for review and that they should receive a response within 2 business days.   Please advise at [facility] [phone]

## 2024-04-05 ENCOUNTER — Other Ambulatory Visit: Payer: Self-pay

## 2024-04-05 DIAGNOSIS — E559 Vitamin D deficiency, unspecified: Secondary | ICD-10-CM

## 2024-04-05 MED ORDER — VITAMIN D (ERGOCALCIFEROL) 1.25 MG (50000 UNIT) PO CAPS: 50000.0000 [IU] | ORAL_CAPSULE | ORAL | 1 refills | Status: DC

## 2024-04-05 NOTE — Telephone Encounter (Signed)
This is okay to refill for 90 days

## 2024-04-05 NOTE — Telephone Encounter (Signed)
 Noted. Rx was refilled and sent with 90 day supply.

## 2024-04-07 ENCOUNTER — Other Ambulatory Visit: Payer: Self-pay | Admitting: Family Medicine

## 2024-04-07 ENCOUNTER — Ambulatory Visit: Payer: Self-pay | Admitting: Family Medicine

## 2024-04-07 DIAGNOSIS — E559 Vitamin D deficiency, unspecified: Secondary | ICD-10-CM

## 2024-04-07 LAB — VITAMIN D 1,25 DIHYDROXY
Vitamin D 1, 25 (OH)2 Total: 18 pg/mL (ref 18–72)
Vitamin D2 1, 25 (OH)2: <8 pg/mL
Vitamin D3 1, 25 (OH)2: 18 pg/mL

## 2024-04-07 MED ORDER — VITAMIN D (ERGOCALCIFEROL) 1.25 MG (50000 UNIT) PO CAPS: 50000.0000 [IU] | ORAL_CAPSULE | ORAL | 3 refills | Status: AC

## 2024-04-09 ENCOUNTER — Ambulatory Visit (INDEPENDENT_AMBULATORY_CARE_PROVIDER_SITE_OTHER): Admitting: Family Medicine

## 2024-04-09 VITALS — BP 124/62 | HR 66 | Temp 96.4°F | Wt 194.8 lb

## 2024-04-09 DIAGNOSIS — D508 Other iron deficiency anemias: Secondary | ICD-10-CM

## 2024-04-09 DIAGNOSIS — R350 Frequency of micturition: Secondary | ICD-10-CM

## 2024-04-09 DIAGNOSIS — E119 Type 2 diabetes mellitus without complications: Secondary | ICD-10-CM

## 2024-04-09 DIAGNOSIS — Z794 Long term (current) use of insulin: Secondary | ICD-10-CM

## 2024-04-09 DIAGNOSIS — N1831 Chronic kidney disease, stage 3a: Secondary | ICD-10-CM

## 2024-04-09 DIAGNOSIS — I255 Ischemic cardiomyopathy: Secondary | ICD-10-CM

## 2024-04-09 DIAGNOSIS — I10 Essential (primary) hypertension: Secondary | ICD-10-CM

## 2024-04-09 DIAGNOSIS — E785 Hyperlipidemia, unspecified: Secondary | ICD-10-CM

## 2024-04-09 DIAGNOSIS — E559 Vitamin D deficiency, unspecified: Secondary | ICD-10-CM

## 2024-04-09 DIAGNOSIS — N401 Enlarged prostate with lower urinary tract symptoms: Secondary | ICD-10-CM

## 2024-04-09 NOTE — Progress Notes (Signed)
 Assessment & Plan   Assessment/Plan:    Assessment and Plan Assessment & Plan Chronic Vitamin D  deficiency Chronic vitamin D  deficiency, currently managed with high-dose vitamin D  supplementation. Recent labs show improvement to low normal range. Discussed potential causes including absorption issues and age-related factors. Emphasized the importance of maintaining levels to prevent long-term complications such as bone health issues and immune function. Explained that high doses are necessary for him and are not dangerous in his case. Vitamin D2 is preferred for better absorption. - Continue vitamin D  50,000 IU weekly - Recheck vitamin D  levels in 6 weeks  Hypertension Hypertension, currently well-controlled without medication. Previously on losartan , but discontinued due to low blood pressure readings. Blood pressure currently at goal. Discussed the protective benefits of losartan  for heart and kidneys, especially in diabetics, but determined that current control without medication is sufficient. - Monitor blood pressure regularly - Reinstate losartan  if blood pressure rises above target range -Follow-up in 6 months recheck blood pressure  Chronic kidney disease Chronic kidney disease with stable kidney function. BUN slightly elevated but not concerning. Discussed the importance of maintaining blood pressure and diabetes control to protect kidney function.  Hyperlipidemia Hyperlipidemia, managed with rosuvastatin  40 mg daily.  Benign prostatic hyperplasia Benign prostatic hyperplasia, managed with tamsulosin  0.4 mg daily.  Chronic venous insufficiency with lower extremity edema Chronic venous insufficiency with associated lower extremity edema, managed with furosemide  40 mg as needed.  Mild anemia Mild anemia with hemoglobin slightly below normal range. Long-standing history of anemia, managed with iron supplementation. Current levels are stable and not concerning. - Continue iron  supplementation as currently prescribed      There are no discontinued medications.          Subjective:   Encounter date: 04/09/2024  Tim Morgan is a 89 y.o. male who has Acute medial meniscal tear; Chronic sinusitis/chronic cough ; Mild persistent asthma in adult without complication; Essential hypertension; Upper airway cough syndrome; Venous stasis of both lower extremities; Cellulitis of face; Type 2 diabetes mellitus without complication, with long-term current use of insulin (HCC); H/O local excision of skin lesion on right lower leg; Rash; Vitamin D  deficiency; Cardiomyopathy (HCC); RBBB; Acute cough; Chest wall pain; Class 1 obesity with serious comorbidity in adult; Hyperlipidemia; Acute pain of right shoulder; Fall; Preop general physical exam; Neck pain; Left hip pain; Blurry vision, bilateral; Acute pain of left knee; Viral URI with cough; Nocturnal leg cramps; Iron deficiency anemia secondary to inadequate dietary iron intake; Benign prostatic hyperplasia with urinary frequency; and CKD stage 3a, GFR 45-59 ml/min (HCC) on their problem list..   He  has a past medical history of Acute bronchitis, COPD (chronic obstructive pulmonary disease) (HCC), Diabetes mellitus, Edema of left lower extremity (07/03/2012), GERD (gastroesophageal reflux disease), History of back injury (07/01/1988), Hypertension (12/06/2011), Left ventricular hypertrophy, Melanoma (HCC), and Vitamin D  deficiency.[name]   He presents with chief complaint of Medication Management (Rx refills, explain recent labs, request lab results faxed to Endocrinologist) .   Discussed the use of AI scribe software for clinical note transcription with the patient, who gave verbal consent to proceed.  History of Present Illness Tim Morgan is an 89 year old male who presents for follow-up on chronic care management. He is accompanied by his wife.  Vitamin d  deficiency and associated symptoms - Vitamin D  deficiency  managed with vitamin D  50,000 units weekly - Recent vitamin D  level reached low normal range at 18 ng/mL - Weakness and low endurance  attributed to low vitamin D  levels - Concern about long-term effects of deficiency - No acute symptoms reported  Hypotension and antihypertensive management - Previously on losartan , discontinued on September 21st due to low blood pressure readings (95/55 mmHg) - Current blood pressure is 124/62 mmHg  Hyperlipidemia - Managed with rosuvastatin  40 mg daily  Benign prostatic hyperplasia - Managed with tamsulosin  0.4 mg daily  Diabetes mellitus - Diabetes managed by endocrinology  Peripheral edema and venous insufficiency - Lower extremity swelling attributed to venous insufficiency - Lasix  40 mg daily as needed for edema  Renal function abnormality - BUN level elevated at 30, slightly above normal - Concern about this laboratory result  Anemia - Iron supplementation with 65 mg daily - Hemoglobin level at 12.8 g/dL, indicating mild deficiency     ROS  Past Surgical History:  Procedure Laterality Date   ANKLE SURGERY Left 07/01/1988   APPENDECTOMY  age 89   BRAIN SURGERY  07/01/1993   concusion   CATARACT EXTRACTION Right 04/03/2021   CATARACT EXTRACTION Left 05/01/2021   HERNIA REPAIR     x 2   KNEE ARTHROSCOPY Left 11/18/2012   Procedure: LEFT ARTHROSCOPY KNEE WITH DEBRIDEMENT AND CHRONDROLPLASTY;  Surgeon: Dempsey LULLA Moan, MD;  Location: [facility];  Service: Orthopedics;  Laterality: Left;   KNEE SURGERY     NASAL SINUS SURGERY Bilateral    to open up ear tubes    Outpatient Medications Prior to Visit  Medication Sig Dispense Refill   Ascorbic Acid (VITAMIN C) 1000 MG tablet Take 1,000 mg by mouth.     baclofen  (LIORESAL ) 10 MG tablet Take 1 tablet (10 mg total) by mouth 4 (four) times daily as needed (leg cramps). (Patient taking differently: Take 10 mg by mouth 4 (four) times daily as needed (leg cramps). Takes twice daily for leg  cramps) 360 tablet 3   CALCIUM  MAGNESIUM ZINC  PO Take 1 tablet by mouth 2 (two) times daily.     Chromium-Cinnamon (CINNAMON PLUS CHROMIUM PO) Take 2-3 capsules by mouth 2 (two) times daily.     ciclopirox (PENLAC) 8 % solution      Coenzyme Q10 (COQ10) 100 MG CAPS Take 1 capsule by mouth daily.     cyanocobalamin (VITAMIN B12) 1000 MCG tablet Take 1,000 mcg by mouth daily.     ferrous sulfate 325 (65 FE) MG tablet Take 1 tablet by mouth daily.     furosemide  (LASIX ) 40 MG tablet Take 1 tablet (40 mg total) by mouth daily. 90 tablet 3   insulin glargine (LANTUS) 100 UNIT/ML injection Inject 18 Units into the skin at bedtime.     metFORMIN (GLUMETZA) 500 MG (MOD) 24 hr tablet Take 500 mg by mouth 2 (two) times daily with a meal.     Multiple Vitamins-Minerals (CENTRUM SILVER MEN 50+) TABS Take 1 tablet by mouth daily.     Multiple Vitamins-Minerals (PRESERVISION AREDS) TABS Take 1 tablet by mouth daily.     omeprazole  (PRILOSEC) 20 MG capsule Take 1 capsule (20 mg total) by mouth daily. 90 capsule 1   rosuvastatin  (CRESTOR ) 40 MG tablet TAKE 1 TABLET AT BEDTIME 90 tablet 3   tamsulosin  (FLOMAX ) 0.4 MG CAPS capsule Take 1 capsule (0.4 mg total) by mouth daily. 90 capsule 1   TRULICITY 0.75 MG/0.5ML SOPN      Vitamin D , Ergocalciferol , (DRISDOL ) 1.25 MG (50000 UNIT) CAPS capsule Take 1 capsule (50,000 Units total) by mouth every 7 (seven) days. 12 capsule 3   acetaminophen  (  TYLENOL ) 650 MG CR tablet Take 650 mg by mouth every 8 (eight) hours as needed for pain. (Patient not taking: Reported on 04/09/2024)     losartan  (COZAAR ) 50 MG tablet TAKE 1 TABLET DAILY (Patient not taking: Reported on 04/09/2024) 90 tablet 3   No facility-administered medications prior to visit.    No family history on file.  Social History   Socioeconomic History   Marital status: Married    Spouse name: Not on file   Number of children: 0   Years of education: Not on file   Highest education level: Not on file   Occupational History   Occupation: Retired Hospital doctor  Tobacco Use   Smoking status: Never   Smokeless tobacco: Never  Vaping Use   Vaping status: Never Used  Substance and Sexual Activity   Alcohol use: No   Drug use: No   Sexual activity: Not Currently    Birth control/protection: None  Other Topics Concern   Not on file  Social History Narrative   Not on file   Social Drivers of Health   Financial Resource Strain: Low Risk  (02/23/2024)   Overall Financial Resource Strain (CARDIA)    Difficulty of Paying Living Expenses: Not hard at all  Food Insecurity: No Food Insecurity (02/23/2024)   Hunger Vital Sign    Worried About Running Out of Food in the Last Year: Never true    Ran Out of Food in the Last Year: Never true  Transportation Needs: No Transportation Needs (02/23/2024)   PRAPARE - Administrator, Civil Service (Medical): No    Lack of Transportation (Non-Medical): No  Physical Activity: Inactive (02/23/2024)   Exercise Vital Sign    Days of Exercise per Week: 0 days    Minutes of Exercise per Session: 0 min  Stress: No Stress Concern Present (02/23/2024)   [employer] of Occupational Health - Occupational Stress Questionnaire    Feeling of Stress: Not at all  Social Connections: Moderately Integrated (02/23/2024)   Social Connection and Isolation Panel    Frequency of Communication with Friends and Family: More than three times a week    Frequency of Social Gatherings with Friends and Family: More than three times a week    Attends Religious Services: More than 4 times per year    Active Member of [employer] or Organizations: No    Attends Banker Meetings: Never    Marital Status: Married  Catering manager Violence: Not At Risk (02/23/2024)   Humiliation, Afraid, Rape, and Kick questionnaire    Fear of Current or Ex-Partner: No    Emotionally Abused: No    Physically Abused: No    Sexually Abused: No                                                                                                   Objective:  Physical Exam: BP 124/62 (BP Location: Left Arm, Patient Position: Sitting, Cuff Size: Normal)   Pulse 66   Temp (!) 96.4 F (35.8 C)   Wt 194 lb 12.8 oz (  88.4 kg)   SpO2 97%   BMI 28.77 kg/m    Physical Exam VITALS: BP- 124/62 GENERAL: Alert, cooperative, well developed, no acute distress HEENT: Normocephalic, normal oropharynx, moist mucous membranes CHEST: Clear to auscultation bilaterally, No wheezes, rhonchi, or crackles CARDIOVASCULAR: Normal heart rate and rhythm, S1 and S2 normal without murmurs ABDOMEN: Soft, non-tender, non-distended, without organomegaly, Normal bowel sounds EXTREMITIES: 2+ pitting edema lower extremities mid shin downward, varicose veins without ulcer bilateral lower extremities NEUROLOGICAL: Cranial nerves grossly intact, Moves all extremities without gross motor or sensory deficit   Physical Exam  No results found.  Recent Results (from the past 2160 hours)  PSA     Status: Abnormal   Collection Time: 02/25/24 11:05 AM  Result Value Ref Range   PSA 0.06 (L) 0.10 - 4.00 ng/mL    Comment: Test performed using Access Hybritech PSA Assay, a parmagnetic partical, chemiluminecent immunoassay.  Urinalysis w microscopic + reflex cultur     Status: Abnormal   Collection Time: 02/25/24 11:05 AM   Specimen: Blood  Result Value Ref Range   Color, Urine YELLOW YELLOW   APPearance CLEAR CLEAR   Specific Gravity, Urine 1.021 1.001 - 1.035   pH 5.5 5.0 - 8.0   Glucose, UA NEGATIVE NEGATIVE   Bilirubin Urine NEGATIVE NEGATIVE   Ketones, ur TRACE (A) NEGATIVE   Hgb urine dipstick NEGATIVE NEGATIVE   Protein, ur TRACE (A) NEGATIVE   Nitrites, Initial NEGATIVE NEGATIVE   Leukocyte Esterase TRACE (A) NEGATIVE   WBC, UA NONE SEEN 0 - 5 /HPF   RBC / HPF NONE SEEN 0 - 2 /HPF   Squamous Epithelial / HPF 0-5 < OR = 5 /HPF   Bacteria, UA NONE SEEN NONE SEEN /HPF   Hyaline Cast NONE  SEEN NONE SEEN /LPF   Note      Comment: This urine was analyzed for the presence of WBC,  RBC, bacteria, casts, and other formed elements.  Only those elements seen were reported. . .   Comprehensive metabolic panel with GFR     Status: Abnormal   Collection Time: 02/25/24 11:05 AM  Result Value Ref Range   Sodium 139 135 - 145 mEq/L   Potassium 4.5 3.5 - 5.1 mEq/L   Chloride 102 96 - 112 mEq/L   CO2 28 19 - 32 mEq/L   Glucose, Bld 96 70 - 99 mg/dL   BUN 30 (H) 6 - 23 mg/dL   Creatinine, Ser 8.82 0.40 - 1.50 mg/dL   Total Bilirubin 0.6 0.2 - 1.2 mg/dL   Alkaline Phosphatase 44 39 - 117 U/L   AST 18 0 - 37 U/L   ALT 16 0 - 53 U/L   Total Protein 6.3 6.0 - 8.3 g/dL   Albumin 4.0 3.5 - 5.2 g/dL   GFR 44.49 (L) >39.99 mL/min    Comment: Calculated using the CKD-EPI Creatinine Equation (2021)   Calcium  8.9 8.4 - 10.5 mg/dL  CBC with Differential/Platelet     Status: Abnormal   Collection Time: 02/25/24 11:05 AM  Result Value Ref Range   WBC 4.9 4.0 - 10.5 K/uL   RBC 4.22 4.22 - 5.81 Mil/uL   Hemoglobin 12.8 (L) 13.0 - 17.0 g/dL   HCT 61.1 (L) 60.9 - 47.9 %   MCV 92.0 78.0 - 100.0 fl   MCHC 33.0 30.0 - 36.0 g/dL   RDW 86.4 88.4 - 84.4 %   Platelets 235.0 150.0 - 400.0 K/uL   Neutrophils Relative % 57.6  43.0 - 77.0 %   Lymphocytes Relative 28.0 12.0 - 46.0 %   Monocytes Relative 7.2 3.0 - 12.0 %   Eosinophils Relative 6.3 (H) 0.0 - 5.0 %   Basophils Relative 0.9 0.0 - 3.0 %   Neutro Abs 2.8 1.4 - 7.7 K/uL   Lymphs Abs 1.4 0.7 - 4.0 K/uL   Monocytes Absolute 0.4 0.1 - 1.0 K/uL   Eosinophils Absolute 0.3 0.0 - 0.7 K/uL   Basophils Absolute 0.0 0.0 - 0.1 K/uL  Vitamin D  1,25 dihydroxy     Status: Abnormal   Collection Time: 02/25/24 11:05 AM  Result Value Ref Range   Vitamin D  1, 25 (OH)2 Total 17 (L) 18 - 72 pg/mL   Vitamin D3 1, 25 (OH)2 17 pg/mL   Vitamin D2 1, 25 (OH)2 <8 pg/mL    Comment: (Note) Vitamin D3, 1,25(OH)2 indicates both endogenous  production and  supplementation. Vitamin D2, 1,25(OH)2 is  an indicator of exogenous sources, such as diet or  supplementation. Interpretation and therapy are based on  measurement of Vitamin D , 1,25 (OH)2, Total. . This test was developed, and its analytical performance  characteristics have been determined by [facility]. It has not been cleared or approved by the  FDA. This assay has been validated pursuant to the CLIA  regulations and is used for clinical purposes. . For additional information, please refer to http://education.[facility].com/faq/FAQ199 (This link is being provided for  informational/educational purposes only.) . [facility] [address] [place] [address] [phone] Johanna Agent L. Gino, MD, PhD   Microalbumin / creatinine urine ratio     Status: None   Collection Time: 02/25/24 11:05 AM  Result Value Ref Range   Microalb, Ur 0.8 0.0 - 1.9 mg/dL   Creatinine,U 837.3 mg/dL   Microalb Creat Ratio 4.9 0.0 - 30.0 mg/g  Hemoglobin A1c     Status: None   Collection Time: 02/25/24 11:05 AM  Result Value Ref Range   Hgb A1c MFr Bld 6.5 4.6 - 6.5 %    Comment: Glycemic Control Guidelines for People with Diabetes:Non Diabetic:  <6%Goal of Therapy: <7%Additional Action Suggested:  >8%   Lipid panel     Status: None   Collection Time: 02/25/24 11:05 AM  Result Value Ref Range   Cholesterol 132 0 - 200 mg/dL    Comment: ATP III Classification       Desirable:  < 200 mg/dL               Borderline High:  200 - 239 mg/dL          High:  > = 759 mg/dL   Triglycerides 879.9 0.0 - 149.0 mg/dL    Comment: Normal:  <849 mg/dLBorderline High:  150 - 199 mg/dL   HDL 51.49 >60.99 mg/dL   VLDL 75.9 0.0 - 59.9 mg/dL   LDL Cholesterol 59 0 - 99 mg/dL   Total CHOL/HDL Ratio 3     Comment:                Men          Women1/2 Average Risk     3.4          3.3Average Risk          5.0          4.42X Average Risk          9.6          7.13X Average  Risk          15.0          11.0                       NonHDL 83.15     Comment: NOTE:  Non-HDL goal should be 30 mg/dL higher than patient's LDL goal (i.e. LDL goal of < 70 mg/dL, would have non-HDL goal of < 100 mg/dL)  TSH     Status: None   Collection Time: 02/25/24 11:05 AM  Result Value Ref Range   TSH 1.31 0.35 - 5.50 uIU/mL  Urine Culture     Status: None   Collection Time: 02/25/24 11:05 AM  Result Value Ref Range   MICRO NUMBER: [id number]    SPECIMEN QUALITY: Adequate    Sample Source URINE    STATUS: FINAL    Result: No Growth   REFLEXIVE URINE CULTURE     Status: None   Collection Time: 02/25/24 11:05 AM  Result Value Ref Range   REFLEXIVE URINE CULTURE      Comment: CULTURE INDICATED - RESULTS TO FOLLOW  Vitamin D  1,25 dihydroxy     Status: None   Collection Time: 04/02/24  1:20 PM  Result Value Ref Range   Vitamin D  1, 25 (OH)2 Total 18 18 - 72 pg/mL   Vitamin D3 1, 25 (OH)2 18 pg/mL   Vitamin D2 1, 25 (OH)2 <8 pg/mL    Comment: (Note) Vitamin D3, 1,25(OH)2 indicates both endogenous  production and supplementation. Vitamin D2, 1,25(OH)2 is  an indicator of exogenous sources, such as diet or  supplementation. Interpretation and therapy are based on  measurement of Vitamin D , 1,25 (OH)2, Total. . This test was developed, and its analytical performance  characteristics have been determined by [facility]. It has not been cleared or approved by the  FDA. This assay has been validated pursuant to the CLIA  regulations and is used for clinical purposes. . For additional information, please refer to http://education.[facility].com/faq/FAQ199 (This link is being provided for  informational/educational purposes only.) . [facility] [address] [facility] [address] [facility] [address] [place] [address] [phone] Johanna Agent L. Gino, MD, PhD         Beverley Adine Hummer, MD, MS

## 2024-04-10 DIAGNOSIS — N401 Enlarged prostate with lower urinary tract symptoms: Secondary | ICD-10-CM | POA: Insufficient documentation

## 2024-04-10 DIAGNOSIS — N1831 Chronic kidney disease, stage 3a: Secondary | ICD-10-CM | POA: Insufficient documentation

## 2024-04-10 DIAGNOSIS — D508 Other iron deficiency anemias: Secondary | ICD-10-CM | POA: Insufficient documentation

## 2024-04-10 MED ORDER — FUROSEMIDE 40 MG PO TABS: 40.0000 mg | ORAL_TABLET | Freq: Every day | ORAL | 3 refills | Status: AC

## 2024-04-10 MED ORDER — FERROUS SULFATE 325 (65 FE) MG PO TABS: 325.0000 mg | ORAL_TABLET | Freq: Every day | ORAL | 3 refills | Status: AC

## 2024-04-28 ENCOUNTER — Encounter (INDEPENDENT_AMBULATORY_CARE_PROVIDER_SITE_OTHER): Admitting: Ophthalmology

## 2024-04-28 DIAGNOSIS — Z7984 Long term (current) use of oral hypoglycemic drugs: Secondary | ICD-10-CM

## 2024-04-28 DIAGNOSIS — H43813 Vitreous degeneration, bilateral: Secondary | ICD-10-CM

## 2024-04-28 DIAGNOSIS — I10 Essential (primary) hypertension: Secondary | ICD-10-CM

## 2024-04-28 DIAGNOSIS — Z794 Long term (current) use of insulin: Secondary | ICD-10-CM | POA: Diagnosis not present

## 2024-04-28 DIAGNOSIS — E113293 Type 2 diabetes mellitus with mild nonproliferative diabetic retinopathy without macular edema, bilateral: Secondary | ICD-10-CM | POA: Diagnosis not present

## 2024-04-28 DIAGNOSIS — H43821 Vitreomacular adhesion, right eye: Secondary | ICD-10-CM

## 2024-04-28 DIAGNOSIS — H35033 Hypertensive retinopathy, bilateral: Secondary | ICD-10-CM

## 2024-05-03 ENCOUNTER — Encounter: Payer: Self-pay | Admitting: Radiology

## 2024-05-20 ENCOUNTER — Other Ambulatory Visit

## 2024-05-20 DIAGNOSIS — E559 Vitamin D deficiency, unspecified: Secondary | ICD-10-CM

## 2024-05-24 LAB — VITAMIN D 1,25 DIHYDROXY
Vitamin D 1, 25 (OH)2 Total: 20 pg/mL (ref 18–72)
Vitamin D2 1, 25 (OH)2: <8 pg/mL
Vitamin D3 1, 25 (OH)2: 20 pg/mL

## 2024-05-25 ENCOUNTER — Ambulatory Visit: Payer: Self-pay | Admitting: Family

## 2024-05-26 NOTE — Progress Notes (Signed)
 Last read by [name] at 12:34PM on 05/25/2024.

## 2024-05-31 ENCOUNTER — Ambulatory Visit: Admitting: Dermatology

## 2024-05-31 ENCOUNTER — Encounter: Payer: Self-pay | Admitting: Dermatology

## 2024-05-31 VITALS — BP 129/66 | HR 68 | Temp 98.1°F

## 2024-05-31 DIAGNOSIS — Z1283 Encounter for screening for malignant neoplasm of skin: Secondary | ICD-10-CM

## 2024-05-31 DIAGNOSIS — W908XXA Exposure to other nonionizing radiation, initial encounter: Secondary | ICD-10-CM | POA: Diagnosis not present

## 2024-05-31 DIAGNOSIS — L814 Other melanin hyperpigmentation: Secondary | ICD-10-CM | POA: Diagnosis not present

## 2024-05-31 DIAGNOSIS — D229 Melanocytic nevi, unspecified: Secondary | ICD-10-CM

## 2024-05-31 DIAGNOSIS — Z85828 Personal history of other malignant neoplasm of skin: Secondary | ICD-10-CM

## 2024-05-31 DIAGNOSIS — D489 Neoplasm of uncertain behavior, unspecified: Secondary | ICD-10-CM | POA: Diagnosis not present

## 2024-05-31 DIAGNOSIS — L570 Actinic keratosis: Secondary | ICD-10-CM | POA: Diagnosis not present

## 2024-05-31 DIAGNOSIS — L578 Other skin changes due to chronic exposure to nonionizing radiation: Secondary | ICD-10-CM

## 2024-05-31 DIAGNOSIS — D0461 Carcinoma in situ of skin of right upper limb, including shoulder: Secondary | ICD-10-CM

## 2024-05-31 DIAGNOSIS — D0462 Carcinoma in situ of skin of left upper limb, including shoulder: Secondary | ICD-10-CM | POA: Diagnosis not present

## 2024-05-31 DIAGNOSIS — L821 Other seborrheic keratosis: Secondary | ICD-10-CM

## 2024-05-31 DIAGNOSIS — D099 Carcinoma in situ, unspecified: Secondary | ICD-10-CM

## 2024-05-31 DIAGNOSIS — L720 Epidermal cyst: Secondary | ICD-10-CM

## 2024-05-31 DIAGNOSIS — D1801 Hemangioma of skin and subcutaneous tissue: Secondary | ICD-10-CM

## 2024-05-31 DIAGNOSIS — Z85820 Personal history of malignant melanoma of skin: Secondary | ICD-10-CM

## 2024-05-31 HISTORY — DX: Carcinoma in situ, unspecified: D09.9

## 2024-05-31 NOTE — Patient Instructions (Addendum)

## 2024-05-31 NOTE — Progress Notes (Deleted)
   New Patient Visit   Subjective  Tim Morgan is a 89 y.o. male who presents for the following: ***  Patient states he  has *** located at the {LOCATION ON BODY:21951} that he  would like to have examined. Patient reports the areas have been there for {NUMBER 1-10:22536} {Time; units week/month/year w plurals:19499}. He reports the areas {Actions; are/are not:16769} bothersome.Patient rates irritation {NUMBER 1-10:22536} out of 10. He states that the areas {ACTIONS; HAVE/HAVE NOT:19434} spread. Patient reports he  {HAS HAS WNU:81165} previously been treated for these areas. Patient *** Hx of bx. Patient *** family history of skin cancer(s).  The patient has spots, moles and lesions to be evaluated, some may be new or changing and the patient may have concern these could be cancer.   The following portions of the chart were reviewed this encounter and updated as appropriate: medications, allergies, medical history  Review of Systems:  No other skin or systemic complaints except as noted in HPI or Assessment and Plan.  Objective  Well appearing patient in no apparent distress; mood and affect are within normal limits.  ***A full examination was performed including scalp, head, eyes, ears, nose, lips, neck, chest, axillae, abdomen, back, buttocks, bilateral upper extremities, bilateral lower extremities, hands, feet, fingers, toes, fingernails, and toenails. All findings within normal limits unless otherwise noted below.  ***A focused examination was performed of the following areas: ***  Relevant exam findings are noted in the Assessment and Plan.  HISTORY OF SQUAMOUS CELL CARCINOMA OF THE SKIN - No evidence of recurrence today - No lymphadenopathy - Recommend regular full body skin exams - Recommend daily broad spectrum sunscreen SPF 30+ to sun-exposed areas, reapply every 2 hours as needed.  - Call if any new or changing lesions are noted between office visits    Assessment &  Plan       No follow-ups on file.  I, Doyce Pan, CMA, am acting as scribe for RUFUS CHRISTELLA HOLY, MD.   Documentation: I have reviewed the above documentation for accuracy and completeness, and I agree with the above.  RUFUS CHRISTELLA HOLY, MD

## 2024-05-31 NOTE — Progress Notes (Unsigned)
 New Patient Visit    History of Present Illness Tim Morgan is an 89 year old male who presents with skin lesions. He is accompanied by his wife.  He has a history of skin cancer treated approximately five years ago at [facility] in [place]. The specific type of skin cancer is not recalled, but it was possibly carcinoma. The lesion on his ankle took a long time to heal and was treated with a biopsy procedure. He has a scar on his back from a previous excision.  He currently presents with multiple itchy and scaly skin lesions that catch on fabric. He describes a lesion on his back that was previously excised, leaving a scar. He has a lesion on his leg, which he initially thought was an ingrown hair. He has had some lesions frozen in the past, though he is unsure of the details.  He is concerned about a possible fungal infection between his toes, fearing it might be spreading through his bloodstream. He also has a lesion on the back of his leg and a lesion in front of his ear.  He uses a cane for balance, which was recommended by a PA at the [facility] due to concerns about his balance and risk of falls. He initially resisted using a cane. No systemic symptoms such as fever or malaise.  The following portions of the chart were reviewed this encounter and updated as appropriate: medications, allergies, medical history  Review of Systems:  No other skin or systemic complaints except as noted in HPI or Assessment and Plan.  Objective  Well appearing patient in no apparent distress; mood and affect are within normal limits.  A full examination was performed including scalp, head, eyes, ears, nose, lips, neck, chest, axillae, abdomen, back, buttocks, bilateral upper extremities, bilateral lower extremities, hands, feet, fingers, toes, fingernails, and toenails. All findings within normal limits unless otherwise noted below.   Relevant exam findings are noted in the Assessment and Plan.  Left  Dorsal Hand 0.8 pink scaly plaque  Right 2nd Finger Metacarpophalangeal Joint 0.6 pink scaly plaque  Left Hand - Posterior (3), Right Forearm - Anterior (2), Right Frontal Scalp, Right Hand - Posterior, Right Preauricular Area Erythematous thin papules/macules with gritty scale.   Assessment & Plan   LENTIGINES, SEBORRHEIC KERATOSES, HEMANGIOMAS - Benign normal skin lesions - Benign-appearing - Call for any changes  MELANOCYTIC NEVI - Tan-brown and/or pink-flesh-colored symmetric macules and papules - Benign appearing on exam today - Observation - Call clinic for new or changing moles - Recommend daily use of broad spectrum spf 30+ sunscreen to sun-exposed areas.   ACTINIC DAMAGE - Chronic condition, secondary to cumulative UV/sun exposure - diffuse scaly erythematous macules with underlying dyspigmentation - Recommend daily broad spectrum sunscreen SPF 30+ to sun-exposed areas, reapply every 2 hours as needed.  - Staying in the shade or wearing long sleeves, sun glasses (UVA+UVB protection) and wide brim hats (4-inch brim around the entire circumference of the hat) are also recommended for sun protection.  - Call for new or changing lesions.  SKIN CANCER SCREENING PERFORMED TODAY  HISTORY OF MELANOMA - No evidence of recurrence today - No lymphadenopathy - Recommend regular full body skin exams - Recommend daily broad spectrum sunscreen SPF 30+ to sun-exposed areas, reapply every 2 hours as needed.  - Call if any new or changing lesions are noted between office visits   HISTORY OF SQUAMOUS CELL CARCINOMA OF THE SKIN - No evidence of recurrence today -  No lymphadenopathy - Recommend regular full body skin exams - Recommend daily broad spectrum sunscreen SPF 30+ to sun-exposed areas, reapply every 2 hours as needed.  - Call if any new or changing lesions are noted between office visits  SEBORRHEIC KERATOSIS - Stuck-on, waxy, tan-brown papules and/or plaques  -  Benign-appearing - Discussed benign etiology and prognosis. - Observe - Call for any changes  EPIDERMAL INCLUSION CYST Exam: Subcutaneous nodule at right groin  Benign-appearing. Exam most consistent with an epidermal inclusion cyst. Discussed that a cyst is a benign growth that can grow over time and sometimes get irritated or inflamed. Recommend observation if it is not bothersome. Discussed option of surgical excision to remove it if it is growing, symptomatic, or other changes noted. Please call for new or changing lesions so they can be evaluated.  NEOPLASM OF UNCERTAIN BEHAVIOR (2) Left Dorsal Hand Skin / nail biopsy Type of biopsy: tangential   Informed consent: discussed and consent obtained   Timeout: patient name, date of birth, surgical site, and procedure verified   Procedure prep:  Patient was prepped and draped in usual sterile fashion Prep type:  Isopropyl alcohol Anesthesia: the lesion was anesthetized in a standard fashion   Anesthetic:  1% lidocaine  w/ epinephrine  1-100,000 buffered w/ 8.4% NaHCO3 Instrument used: flexible razor blade   Hemostasis achieved with: pressure, aluminum chloride and electrodesiccation   Outcome: patient tolerated procedure well   Post-procedure details: sterile dressing applied and wound care instructions given   Dressing type: bandage and petrolatum    Specimen A - Surgical pathology Differential Diagnosis: R/O NMSC  Check Margins: No Right 2nd Finger Metacarpophalangeal Joint Skin / nail biopsy Type of biopsy: tangential   Informed consent: discussed and consent obtained   Timeout: patient name, date of birth, surgical site, and procedure verified   Procedure prep:  Patient was prepped and draped in usual sterile fashion Prep type:  Isopropyl alcohol Anesthesia: the lesion was anesthetized in a standard fashion   Anesthetic:  1% lidocaine  w/ epinephrine  1-100,000 buffered w/ 8.4% NaHCO3 Instrument used: flexible razor blade    Hemostasis achieved with: pressure, aluminum chloride and electrodesiccation   Outcome: patient tolerated procedure well   Post-procedure details: sterile dressing applied and wound care instructions given   Dressing type: bandage and petrolatum    Specimen B - Surgical pathology Differential Diagnosis: R/O NMSC  Check Margins: No AK (ACTINIC KERATOSIS) (8) Left Hand - Posterior (3), Right Forearm - Anterior (2), Right Frontal Scalp, Right Hand - Posterior, Right Preauricular Area Destruction of lesion - Left Hand - Posterior (3), Right Forearm - Anterior (2), Right Frontal Scalp, Right Hand - Posterior, Right Preauricular Area Complexity: simple   Destruction method: cryotherapy   Informed consent: discussed and consent obtained   Timeout:  patient name, date of birth, surgical site, and procedure verified Lesion destroyed using liquid nitrogen: Yes   Region frozen until ice ball extended beyond lesion: Yes   Outcome: patient tolerated procedure well with no complications   Post-procedure details: wound care instructions given    LENTIGINES   SEBORRHEIC KERATOSIS   CHERRY ANGIOMA   ACTINIC SKIN DAMAGE   MULTIPLE BENIGN NEVI   HISTORY OF NONMELANOMA SKIN CANCER   PERSONAL HISTORY OF MALIGNANT MELANOMA OF SKIN    Return in about 6 months (around 11/29/2024) for TBSE and have sign for release of records and right groin EIC for cyst.  I, Doyce Pan, CMA, am acting as scribe for RUFUS CHRISTELLA HOLY, MD.  Documentation: I have reviewed the above documentation for accuracy and completeness, and I agree with the above.  RUFUS CHRISTELLA HOLY, MD

## 2024-06-01 ENCOUNTER — Ambulatory Visit: Payer: Self-pay | Admitting: Dermatology

## 2024-06-07 ENCOUNTER — Telehealth: Payer: Self-pay

## 2024-06-07 NOTE — Telephone Encounter (Signed)
 Patient says that the wound on his forearm is bulging and tender to touch. He believes it is infected. He is scheduled for evaluation tomorrow at 1:15.

## 2024-06-07 NOTE — Telephone Encounter (Signed)
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 He would like to go over that site location and also he stated that he thinks that his arm is infected and would like an appointment with the doctor to get that seen.  Patient stated to call the [phone] number, asked me to advised that his wife has PT at 3pm so if you call around that time frame you may not get an answer.  Thank you, MO

## 2024-06-08 ENCOUNTER — Ambulatory Visit: Admitting: Dermatology

## 2024-06-10 LAB — SURGICAL PATHOLOGY

## 2024-06-28 ENCOUNTER — Other Ambulatory Visit: Payer: Self-pay | Admitting: Family

## 2024-07-19 ENCOUNTER — Encounter: Payer: Self-pay | Admitting: Dermatology

## 2024-07-22 ENCOUNTER — Encounter: Payer: Self-pay | Admitting: Dermatology

## 2024-07-22 ENCOUNTER — Ambulatory Visit: Admitting: Dermatology

## 2024-07-22 VITALS — BP 159/76 | HR 74 | Temp 98.3°F

## 2024-07-22 DIAGNOSIS — L720 Epidermal cyst: Secondary | ICD-10-CM

## 2024-07-22 DIAGNOSIS — L723 Sebaceous cyst: Secondary | ICD-10-CM

## 2024-07-22 NOTE — Patient Instructions (Signed)

## 2024-07-22 NOTE — Progress Notes (Signed)
 "  Follow-Up Visit   Subjective  Tim Morgan is a 89 y.o. male who presents for the following: Excision of neoplasm of skin in the groin area. He is accompanied by his wife.   The following portions of the chart were reviewed this encounter and updated as appropriate: medications, allergies, medical history  Review of Systems:  No other skin or systemic complaints except as noted in HPI or Assessment and Plan.  Objective  Well appearing patient in no apparent distress; mood and affect are within normal limits.  A focused examination was performed of the following areas: Groin  Relevant physical exam findings are noted in the Assessment and Plan.   Mons Pubis Subcutaneous nodule   Assessment & Plan   SEBACEOUS CYST Mons Pubis - Skin excision - Mons Pubis  Excision method:  elliptical Lesion length (cm):  2.1 Lesion width (cm):  1.1 Margin per side (cm):  0.1 Total excision diameter (cm):  2.3 Informed consent: discussed and consent obtained   Timeout: patient name, date of birth, surgical site, and procedure verified   Procedure prep:  Patient was prepped and draped in usual sterile fashion Prep type:  Chlorhexidine Anesthesia: the lesion was anesthetized in a standard fashion   Anesthetic:  1% lidocaine  w/ epinephrine  1-100,000 buffered w/ 8.4% NaHCO3 Instrument used: #15 blade   Hemostasis achieved with: suture, pressure and electrodesiccation   Outcome: patient tolerated procedure well with no complications   Post-procedure details: sterile dressing applied and wound care instructions given   Dressing type: pressure dressing    - Skin repair - Mons Pubis Complexity:  Complex Final length (cm):  4 Informed consent: discussed and consent obtained   Timeout: patient name, date of birth, surgical site, and procedure verified   Procedure prep:  Patient was prepped and draped in usual sterile fashion Prep type:  Chlorhexidine Reason for type of repair: reduce tension  to allow closure, preserve normal anatomy and preserve normal anatomical and functional relationships   Undermining: area extensively undermined   Subcutaneous layers (deep stitches):  Suture size:  3-0 Suture type: PDS (polydioxanone)   Fine/surface layer approximation (top stitches):  Suture type: cyanoacrylate tissue glue   Hemostasis achieved with: electrodesiccation Outcome: patient tolerated procedure well with no complications   Post-procedure details: sterile dressing applied and wound care instructions given   Dressing type: pressure dressing    Specimen 1 - Surgical pathology Differential Diagnosis: Cyst vs other  Check Margins: No  The surgical wound was then cleaned, prepped, and re-anesthetized as above. Wound edges were undermined extensively along at least one entire edge and at a distance equal to or greater than the width of the defect (see wound defect size above) in order to achieve closure and decrease wound tension and anatomic distortion. Redundant tissue repair including standing cone removal was performed. Hemostasis was achieved with electrocautery. Subcutaneous and epidermal tissues were approximated with the above sutures. The surgical site was then lightly scrubbed with sterile, saline-soaked gauze. Steri-strips were applied, and the area was then bandaged using Vaseline ointment, non-adherent gauze, gauze pads, and tape to provide an adequate pressure dressing. The patient tolerated the procedure well, was given detailed written and verbal wound care instructions, and was discharged in good condition.   The patient will follow-up: PRN.  Return if symptoms worsen or fail to improve.  I, Doyce Pan, CMA, am acting as scribe for Tim CHRISTELLA HOLY, MD.   Documentation: I have reviewed the above documentation for accuracy and  completeness, and I agree with the above.  Tim CHRISTELLA HOLY, MD  "

## 2024-07-23 LAB — SURGICAL PATHOLOGY

## 2024-07-24 ENCOUNTER — Ambulatory Visit: Payer: Self-pay | Admitting: Dermatology

## 2024-08-02 ENCOUNTER — Encounter: Admitting: Dermatology

## 2024-08-16 ENCOUNTER — Encounter: Admitting: Dermatology

## 2024-08-26 ENCOUNTER — Ambulatory Visit: Admitting: Family Medicine

## 2024-09-21 ENCOUNTER — Encounter: Admitting: Dermatology

## 2024-11-30 ENCOUNTER — Ambulatory Visit: Admitting: Dermatology

## 2025-02-28 ENCOUNTER — Ambulatory Visit

## 2025-05-02 ENCOUNTER — Encounter (INDEPENDENT_AMBULATORY_CARE_PROVIDER_SITE_OTHER): Admitting: Ophthalmology
